# Patient Record
Sex: Male | Born: 1940 | Race: White | Hispanic: No | Marital: Married | Smoking: Former smoker
Health system: Southern US, Community
[De-identification: ages and names within clinical notes are randomized; demographics above are authoritative.]

## PROBLEM LIST (undated history)

## (undated) DIAGNOSIS — I2510 Atherosclerotic heart disease of native coronary artery without angina pectoris: Secondary | ICD-10-CM

## (undated) DIAGNOSIS — I10 Essential (primary) hypertension: Secondary | ICD-10-CM

## (undated) DIAGNOSIS — K219 Gastro-esophageal reflux disease without esophagitis: Secondary | ICD-10-CM

## (undated) DIAGNOSIS — E7800 Pure hypercholesterolemia, unspecified: Secondary | ICD-10-CM

## (undated) DIAGNOSIS — Z87891 Personal history of nicotine dependence: Secondary | ICD-10-CM

## (undated) DIAGNOSIS — IMO0001 Reserved for inherently not codable concepts without codable children: Secondary | ICD-10-CM

## (undated) DIAGNOSIS — T82855A Stenosis of coronary artery stent, initial encounter: Secondary | ICD-10-CM

## (undated) DIAGNOSIS — I219 Acute myocardial infarction, unspecified: Secondary | ICD-10-CM

## (undated) DIAGNOSIS — J449 Chronic obstructive pulmonary disease, unspecified: Secondary | ICD-10-CM

## (undated) DIAGNOSIS — H9190 Unspecified hearing loss, unspecified ear: Secondary | ICD-10-CM

## (undated) DIAGNOSIS — Z8719 Personal history of other diseases of the digestive system: Secondary | ICD-10-CM

## (undated) DIAGNOSIS — K5790 Diverticulosis of intestine, part unspecified, without perforation or abscess without bleeding: Secondary | ICD-10-CM

## (undated) DIAGNOSIS — N320 Bladder-neck obstruction: Secondary | ICD-10-CM

## (undated) HISTORY — DX: Personal history of nicotine dependence: Z87.891

## (undated) HISTORY — DX: Atherosclerotic heart disease of native coronary artery without angina pectoris: I25.10

## (undated) HISTORY — DX: Bladder-neck obstruction: N32.0

## (undated) HISTORY — PX: ANGIOPLASTY / STENTING FEMORAL: SUR30

## (undated) HISTORY — DX: Acute myocardial infarction, unspecified: I21.9

## (undated) HISTORY — DX: Stenosis of coronary artery stent, initial encounter: T82.855A

## (undated) HISTORY — DX: Gastro-esophageal reflux disease without esophagitis: K21.9

## (undated) HISTORY — DX: Chronic obstructive pulmonary disease, unspecified: J44.9

## (undated) HISTORY — DX: Pure hypercholesterolemia, unspecified: E78.00

## (undated) HISTORY — DX: Diverticulosis of intestine, part unspecified, without perforation or abscess without bleeding: K57.90

---

## 2005-11-22 DIAGNOSIS — I2510 Atherosclerotic heart disease of native coronary artery without angina pectoris: Secondary | ICD-10-CM

## 2005-11-22 HISTORY — DX: Atherosclerotic heart disease of native coronary artery without angina pectoris: I25.10

## 2005-11-22 HISTORY — PX: CORONARY STENT PLACEMENT: SHX1402

## 2006-03-03 ENCOUNTER — Inpatient Hospital Stay: Payer: Self-pay | Admitting: Internal Medicine

## 2006-03-03 ENCOUNTER — Other Ambulatory Visit: Payer: Self-pay

## 2006-03-04 ENCOUNTER — Other Ambulatory Visit: Payer: Self-pay

## 2007-02-28 ENCOUNTER — Ambulatory Visit: Payer: Self-pay | Admitting: Gastroenterology

## 2007-03-24 ENCOUNTER — Ambulatory Visit: Payer: Self-pay | Admitting: Gastroenterology

## 2008-12-19 ENCOUNTER — Ambulatory Visit: Payer: Self-pay | Admitting: Gastroenterology

## 2009-04-10 ENCOUNTER — Emergency Department: Payer: Self-pay | Admitting: Unknown Physician Specialty

## 2011-01-19 ENCOUNTER — Ambulatory Visit: Payer: Self-pay | Admitting: Gastroenterology

## 2011-01-21 LAB — PATHOLOGY REPORT

## 2011-11-23 DIAGNOSIS — T82855A Stenosis of coronary artery stent, initial encounter: Secondary | ICD-10-CM

## 2011-11-23 HISTORY — DX: Stenosis of coronary artery stent, initial encounter: T82.855A

## 2011-12-02 DIAGNOSIS — H2510 Age-related nuclear cataract, unspecified eye: Secondary | ICD-10-CM | POA: Diagnosis not present

## 2011-12-14 DIAGNOSIS — E7800 Pure hypercholesterolemia, unspecified: Secondary | ICD-10-CM | POA: Diagnosis not present

## 2011-12-14 DIAGNOSIS — I2510 Atherosclerotic heart disease of native coronary artery without angina pectoris: Secondary | ICD-10-CM | POA: Diagnosis not present

## 2011-12-14 DIAGNOSIS — I471 Supraventricular tachycardia: Secondary | ICD-10-CM | POA: Diagnosis not present

## 2011-12-14 DIAGNOSIS — I10 Essential (primary) hypertension: Secondary | ICD-10-CM | POA: Diagnosis not present

## 2011-12-14 DIAGNOSIS — K219 Gastro-esophageal reflux disease without esophagitis: Secondary | ICD-10-CM | POA: Diagnosis not present

## 2011-12-14 DIAGNOSIS — R000 Tachycardia, unspecified: Secondary | ICD-10-CM | POA: Diagnosis not present

## 2011-12-14 DIAGNOSIS — Z79899 Other long term (current) drug therapy: Secondary | ICD-10-CM | POA: Diagnosis not present

## 2012-04-25 DIAGNOSIS — I2510 Atherosclerotic heart disease of native coronary artery without angina pectoris: Secondary | ICD-10-CM | POA: Diagnosis not present

## 2012-04-25 DIAGNOSIS — E782 Mixed hyperlipidemia: Secondary | ICD-10-CM | POA: Diagnosis not present

## 2012-04-25 DIAGNOSIS — I471 Supraventricular tachycardia: Secondary | ICD-10-CM | POA: Diagnosis not present

## 2012-04-25 DIAGNOSIS — R0989 Other specified symptoms and signs involving the circulatory and respiratory systems: Secondary | ICD-10-CM | POA: Diagnosis not present

## 2012-04-25 DIAGNOSIS — R0789 Other chest pain: Secondary | ICD-10-CM | POA: Diagnosis not present

## 2012-04-28 DIAGNOSIS — R0789 Other chest pain: Secondary | ICD-10-CM | POA: Diagnosis not present

## 2012-04-28 DIAGNOSIS — I471 Supraventricular tachycardia, unspecified: Secondary | ICD-10-CM | POA: Diagnosis not present

## 2012-05-04 DIAGNOSIS — I2510 Atherosclerotic heart disease of native coronary artery without angina pectoris: Secondary | ICD-10-CM | POA: Diagnosis not present

## 2012-05-04 DIAGNOSIS — R9430 Abnormal result of cardiovascular function study, unspecified: Secondary | ICD-10-CM | POA: Diagnosis not present

## 2012-05-04 DIAGNOSIS — E782 Mixed hyperlipidemia: Secondary | ICD-10-CM | POA: Diagnosis not present

## 2012-05-04 DIAGNOSIS — I471 Supraventricular tachycardia, unspecified: Secondary | ICD-10-CM | POA: Diagnosis not present

## 2012-05-10 ENCOUNTER — Ambulatory Visit: Payer: Self-pay | Admitting: Cardiology

## 2012-05-10 DIAGNOSIS — Z87891 Personal history of nicotine dependence: Secondary | ICD-10-CM | POA: Diagnosis not present

## 2012-05-10 DIAGNOSIS — R9431 Abnormal electrocardiogram [ECG] [EKG]: Secondary | ICD-10-CM | POA: Diagnosis not present

## 2012-05-10 DIAGNOSIS — Z9861 Coronary angioplasty status: Secondary | ICD-10-CM | POA: Diagnosis not present

## 2012-05-10 DIAGNOSIS — Z800 Family history of malignant neoplasm of digestive organs: Secondary | ICD-10-CM | POA: Diagnosis not present

## 2012-05-10 DIAGNOSIS — R0789 Other chest pain: Secondary | ICD-10-CM | POA: Diagnosis not present

## 2012-05-10 DIAGNOSIS — I471 Supraventricular tachycardia: Secondary | ICD-10-CM | POA: Diagnosis not present

## 2012-05-10 DIAGNOSIS — Z79899 Other long term (current) drug therapy: Secondary | ICD-10-CM | POA: Diagnosis not present

## 2012-05-10 DIAGNOSIS — Z823 Family history of stroke: Secondary | ICD-10-CM | POA: Diagnosis not present

## 2012-05-10 DIAGNOSIS — R079 Chest pain, unspecified: Secondary | ICD-10-CM | POA: Diagnosis not present

## 2012-05-10 DIAGNOSIS — I2510 Atherosclerotic heart disease of native coronary artery without angina pectoris: Secondary | ICD-10-CM | POA: Diagnosis not present

## 2012-05-10 DIAGNOSIS — R9430 Abnormal result of cardiovascular function study, unspecified: Secondary | ICD-10-CM | POA: Diagnosis not present

## 2012-05-10 DIAGNOSIS — Z6826 Body mass index (BMI) 26.0-26.9, adult: Secondary | ICD-10-CM | POA: Diagnosis not present

## 2012-05-10 DIAGNOSIS — I10 Essential (primary) hypertension: Secondary | ICD-10-CM | POA: Diagnosis not present

## 2012-05-10 DIAGNOSIS — E785 Hyperlipidemia, unspecified: Secondary | ICD-10-CM | POA: Diagnosis not present

## 2012-05-10 HISTORY — PX: CARDIAC CATHETERIZATION: SHX172

## 2012-05-16 DIAGNOSIS — L570 Actinic keratosis: Secondary | ICD-10-CM | POA: Diagnosis not present

## 2012-05-16 DIAGNOSIS — D239 Other benign neoplasm of skin, unspecified: Secondary | ICD-10-CM | POA: Diagnosis not present

## 2012-05-17 DIAGNOSIS — I2510 Atherosclerotic heart disease of native coronary artery without angina pectoris: Secondary | ICD-10-CM | POA: Diagnosis not present

## 2012-05-17 DIAGNOSIS — E782 Mixed hyperlipidemia: Secondary | ICD-10-CM | POA: Diagnosis not present

## 2012-05-17 DIAGNOSIS — I471 Supraventricular tachycardia: Secondary | ICD-10-CM | POA: Diagnosis not present

## 2012-06-26 DIAGNOSIS — N529 Male erectile dysfunction, unspecified: Secondary | ICD-10-CM | POA: Diagnosis not present

## 2012-06-26 DIAGNOSIS — E7800 Pure hypercholesterolemia, unspecified: Secondary | ICD-10-CM | POA: Diagnosis not present

## 2012-06-26 DIAGNOSIS — Z79899 Other long term (current) drug therapy: Secondary | ICD-10-CM | POA: Diagnosis not present

## 2012-06-26 DIAGNOSIS — I10 Essential (primary) hypertension: Secondary | ICD-10-CM | POA: Diagnosis not present

## 2012-08-09 DIAGNOSIS — E782 Mixed hyperlipidemia: Secondary | ICD-10-CM | POA: Diagnosis not present

## 2012-08-09 DIAGNOSIS — I471 Supraventricular tachycardia: Secondary | ICD-10-CM | POA: Diagnosis not present

## 2012-08-09 DIAGNOSIS — I2510 Atherosclerotic heart disease of native coronary artery without angina pectoris: Secondary | ICD-10-CM | POA: Diagnosis not present

## 2012-08-17 DIAGNOSIS — Z23 Encounter for immunization: Secondary | ICD-10-CM | POA: Diagnosis not present

## 2012-11-23 DIAGNOSIS — I2510 Atherosclerotic heart disease of native coronary artery without angina pectoris: Secondary | ICD-10-CM | POA: Diagnosis not present

## 2012-11-23 DIAGNOSIS — I471 Supraventricular tachycardia: Secondary | ICD-10-CM | POA: Diagnosis not present

## 2012-11-23 DIAGNOSIS — R0989 Other specified symptoms and signs involving the circulatory and respiratory systems: Secondary | ICD-10-CM | POA: Diagnosis not present

## 2012-11-23 DIAGNOSIS — R0609 Other forms of dyspnea: Secondary | ICD-10-CM | POA: Diagnosis not present

## 2012-12-13 DIAGNOSIS — H2510 Age-related nuclear cataract, unspecified eye: Secondary | ICD-10-CM | POA: Diagnosis not present

## 2012-12-27 DIAGNOSIS — K219 Gastro-esophageal reflux disease without esophagitis: Secondary | ICD-10-CM | POA: Diagnosis not present

## 2012-12-27 DIAGNOSIS — E7800 Pure hypercholesterolemia, unspecified: Secondary | ICD-10-CM | POA: Diagnosis not present

## 2012-12-27 DIAGNOSIS — J019 Acute sinusitis, unspecified: Secondary | ICD-10-CM | POA: Diagnosis not present

## 2012-12-27 DIAGNOSIS — Z79899 Other long term (current) drug therapy: Secondary | ICD-10-CM | POA: Diagnosis not present

## 2012-12-27 DIAGNOSIS — N529 Male erectile dysfunction, unspecified: Secondary | ICD-10-CM | POA: Diagnosis not present

## 2013-01-22 DIAGNOSIS — J019 Acute sinusitis, unspecified: Secondary | ICD-10-CM | POA: Diagnosis not present

## 2013-03-20 DIAGNOSIS — J019 Acute sinusitis, unspecified: Secondary | ICD-10-CM | POA: Diagnosis not present

## 2013-05-15 DIAGNOSIS — L570 Actinic keratosis: Secondary | ICD-10-CM | POA: Diagnosis not present

## 2013-05-15 DIAGNOSIS — Z1283 Encounter for screening for malignant neoplasm of skin: Secondary | ICD-10-CM | POA: Diagnosis not present

## 2013-06-21 DIAGNOSIS — I2510 Atherosclerotic heart disease of native coronary artery without angina pectoris: Secondary | ICD-10-CM | POA: Diagnosis not present

## 2013-06-21 DIAGNOSIS — R0789 Other chest pain: Secondary | ICD-10-CM | POA: Diagnosis not present

## 2013-06-21 DIAGNOSIS — I471 Supraventricular tachycardia: Secondary | ICD-10-CM | POA: Diagnosis not present

## 2013-06-21 DIAGNOSIS — R0609 Other forms of dyspnea: Secondary | ICD-10-CM | POA: Diagnosis not present

## 2013-06-21 DIAGNOSIS — R0989 Other specified symptoms and signs involving the circulatory and respiratory systems: Secondary | ICD-10-CM | POA: Diagnosis not present

## 2013-07-09 DIAGNOSIS — E7800 Pure hypercholesterolemia, unspecified: Secondary | ICD-10-CM | POA: Diagnosis not present

## 2013-07-09 DIAGNOSIS — Z79899 Other long term (current) drug therapy: Secondary | ICD-10-CM | POA: Diagnosis not present

## 2013-07-09 DIAGNOSIS — R000 Tachycardia, unspecified: Secondary | ICD-10-CM | POA: Diagnosis not present

## 2013-07-09 DIAGNOSIS — K219 Gastro-esophageal reflux disease without esophagitis: Secondary | ICD-10-CM | POA: Diagnosis not present

## 2013-09-01 DIAGNOSIS — R079 Chest pain, unspecified: Secondary | ICD-10-CM | POA: Diagnosis not present

## 2013-09-27 DIAGNOSIS — Z23 Encounter for immunization: Secondary | ICD-10-CM | POA: Diagnosis not present

## 2013-12-18 DIAGNOSIS — H2510 Age-related nuclear cataract, unspecified eye: Secondary | ICD-10-CM | POA: Diagnosis not present

## 2013-12-26 DIAGNOSIS — I471 Supraventricular tachycardia: Secondary | ICD-10-CM | POA: Diagnosis not present

## 2013-12-26 DIAGNOSIS — I2510 Atherosclerotic heart disease of native coronary artery without angina pectoris: Secondary | ICD-10-CM | POA: Diagnosis not present

## 2013-12-31 DIAGNOSIS — M722 Plantar fascial fibromatosis: Secondary | ICD-10-CM | POA: Diagnosis not present

## 2014-01-09 DIAGNOSIS — Z79899 Other long term (current) drug therapy: Secondary | ICD-10-CM | POA: Diagnosis not present

## 2014-01-09 DIAGNOSIS — Z23 Encounter for immunization: Secondary | ICD-10-CM | POA: Diagnosis not present

## 2014-01-09 DIAGNOSIS — E7800 Pure hypercholesterolemia, unspecified: Secondary | ICD-10-CM | POA: Diagnosis not present

## 2014-01-09 DIAGNOSIS — I10 Essential (primary) hypertension: Secondary | ICD-10-CM | POA: Diagnosis not present

## 2014-01-21 DIAGNOSIS — M722 Plantar fascial fibromatosis: Secondary | ICD-10-CM | POA: Diagnosis not present

## 2014-01-23 DIAGNOSIS — K2270 Barrett's esophagus without dysplasia: Secondary | ICD-10-CM | POA: Diagnosis not present

## 2014-01-23 DIAGNOSIS — Z800 Family history of malignant neoplasm of digestive organs: Secondary | ICD-10-CM | POA: Diagnosis not present

## 2014-02-05 ENCOUNTER — Ambulatory Visit: Payer: Self-pay | Admitting: Gastroenterology

## 2014-02-05 DIAGNOSIS — N320 Bladder-neck obstruction: Secondary | ICD-10-CM | POA: Diagnosis not present

## 2014-02-05 DIAGNOSIS — K2100 Gastro-esophageal reflux disease with esophagitis, without bleeding: Secondary | ICD-10-CM | POA: Diagnosis not present

## 2014-02-05 DIAGNOSIS — E7800 Pure hypercholesterolemia, unspecified: Secondary | ICD-10-CM | POA: Diagnosis not present

## 2014-02-05 DIAGNOSIS — K2270 Barrett's esophagus without dysplasia: Secondary | ICD-10-CM | POA: Diagnosis not present

## 2014-02-05 DIAGNOSIS — Z800 Family history of malignant neoplasm of digestive organs: Secondary | ICD-10-CM | POA: Diagnosis not present

## 2014-02-05 DIAGNOSIS — Z79899 Other long term (current) drug therapy: Secondary | ICD-10-CM | POA: Diagnosis not present

## 2014-02-05 DIAGNOSIS — I498 Other specified cardiac arrhythmias: Secondary | ICD-10-CM | POA: Diagnosis not present

## 2014-02-05 DIAGNOSIS — K219 Gastro-esophageal reflux disease without esophagitis: Secondary | ICD-10-CM | POA: Diagnosis not present

## 2014-02-05 DIAGNOSIS — Z9861 Coronary angioplasty status: Secondary | ICD-10-CM | POA: Diagnosis not present

## 2014-02-05 DIAGNOSIS — K224 Dyskinesia of esophagus: Secondary | ICD-10-CM | POA: Diagnosis not present

## 2014-02-05 DIAGNOSIS — Z7982 Long term (current) use of aspirin: Secondary | ICD-10-CM | POA: Diagnosis not present

## 2014-02-05 DIAGNOSIS — K2970 Gastritis, unspecified, without bleeding: Secondary | ICD-10-CM | POA: Diagnosis not present

## 2014-02-05 DIAGNOSIS — K2990 Gastroduodenitis, unspecified, without bleeding: Secondary | ICD-10-CM | POA: Diagnosis not present

## 2014-02-05 DIAGNOSIS — Z86010 Personal history of colonic polyps: Secondary | ICD-10-CM | POA: Diagnosis not present

## 2014-02-05 DIAGNOSIS — J438 Other emphysema: Secondary | ICD-10-CM | POA: Diagnosis not present

## 2014-02-05 DIAGNOSIS — K5730 Diverticulosis of large intestine without perforation or abscess without bleeding: Secondary | ICD-10-CM | POA: Diagnosis not present

## 2014-02-05 DIAGNOSIS — I2510 Atherosclerotic heart disease of native coronary artery without angina pectoris: Secondary | ICD-10-CM | POA: Diagnosis not present

## 2014-02-05 DIAGNOSIS — I252 Old myocardial infarction: Secondary | ICD-10-CM | POA: Diagnosis not present

## 2014-02-05 DIAGNOSIS — K648 Other hemorrhoids: Secondary | ICD-10-CM | POA: Diagnosis not present

## 2014-02-05 DIAGNOSIS — Z8489 Family history of other specified conditions: Secondary | ICD-10-CM | POA: Diagnosis not present

## 2014-02-05 DIAGNOSIS — I10 Essential (primary) hypertension: Secondary | ICD-10-CM | POA: Diagnosis not present

## 2014-02-05 HISTORY — PX: COLONOSCOPY: SHX174

## 2014-02-05 HISTORY — PX: ESOPHAGOGASTRODUODENOSCOPY: SHX1529

## 2014-02-08 LAB — PATHOLOGY REPORT

## 2014-02-19 LAB — HM COLONOSCOPY

## 2014-04-26 ENCOUNTER — Encounter: Payer: Self-pay | Admitting: Pulmonary Disease

## 2014-04-26 ENCOUNTER — Ambulatory Visit (INDEPENDENT_AMBULATORY_CARE_PROVIDER_SITE_OTHER)
Admission: RE | Admit: 2014-04-26 | Discharge: 2014-04-26 | Disposition: A | Discharge status: Home / Self Care | Payer: Medicare Other | Source: Ambulatory Visit | Attending: Pulmonary Disease | Admitting: Pulmonary Disease

## 2014-04-26 ENCOUNTER — Ambulatory Visit (INDEPENDENT_AMBULATORY_CARE_PROVIDER_SITE_OTHER): Payer: Medicare Other | Admitting: Pulmonary Disease

## 2014-04-26 VITALS — BP 144/86 | HR 70 | Ht 67.0 in | Wt 169.0 lb

## 2014-04-26 DIAGNOSIS — J441 Chronic obstructive pulmonary disease with (acute) exacerbation: Secondary | ICD-10-CM

## 2014-04-26 DIAGNOSIS — R0602 Shortness of breath: Secondary | ICD-10-CM | POA: Diagnosis not present

## 2014-04-26 DIAGNOSIS — J4489 Other specified chronic obstructive pulmonary disease: Secondary | ICD-10-CM | POA: Diagnosis not present

## 2014-04-26 DIAGNOSIS — I2510 Atherosclerotic heart disease of native coronary artery without angina pectoris: Secondary | ICD-10-CM | POA: Insufficient documentation

## 2014-04-26 DIAGNOSIS — I25810 Atherosclerosis of coronary artery bypass graft(s) without angina pectoris: Secondary | ICD-10-CM | POA: Diagnosis not present

## 2014-04-26 DIAGNOSIS — J449 Chronic obstructive pulmonary disease, unspecified: Secondary | ICD-10-CM | POA: Diagnosis not present

## 2014-04-26 NOTE — Progress Notes (Signed)
Quick Note:  lmtcb X1 ______ 

## 2014-04-26 NOTE — Assessment & Plan Note (Signed)
I have no objective data to work on here. Unfortunately he arrived 30 minutes late today so we were unable to perform simple spirometry today. His ambulatory oximetry was normal. I do believe he has COPD based on his extensive smoking history and the fact that he said increasing shortness of breath recently.  Plan: -Full pulmonary function test -Chest x-ray -Start Spiriva -Followup 4-6 weeks

## 2014-04-26 NOTE — Assessment & Plan Note (Signed)
It bothers me that he was told after his most recent left heart catheterization that he needed to have coronary artery bypass grafting but he refused. Given this, cannot rule out coronary artery disease contributing to his shortness of breath. I've asked him to see his cardiologist in the next few weeks to discuss increasing shortness of breath. We will try to make efforts to obtain records from his cardiologist's office.

## 2014-04-26 NOTE — Patient Instructions (Signed)
Take the spiriva two puffs once a day no matter how you feel We will set up a pulmonary function test in [facility] See your cardiologist We will have you get a chest x-ray for shortness of breath today on your way out and will call you with the results We will see you back in 4 weeks in [place] or sooner if needed

## 2014-04-26 NOTE — Progress Notes (Signed)
Subjective:    Patient ID: John Burns, male    DOB: 1941/10/20, 73 y.o.   MRN: [id number]  HPI  John Burns is here today to see me for COPD and shortness of breath. He says that he has known for about 10 years that he has emphysema. He says that he smoked a pack a day for 40 years and quit in the 1990s. He has not had much trouble with shortness of breath over the years but he has had heart trouble. His most recent left heart catheterization showed evidence of coronary artery disease which apparently would only be amenable to bypass surgery. The patient elected to not have surgery. That was approximately in 2012 and he has been managed medically since then.  However, he says that for the last 6 months she's been having increasing shortness of breath with exertion. 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 Specifically, he says that he can walk on level ground without too much trouble but yesterday when he was throwing chunks of wood he started to notice increasing shortness of breath. He also says that whenever he bends over he is been experiencing more shortness of breath and chest tightness. He has not had chest pain or leg swelling with his shortness of breath. He says typically it takes him about 5-10 minutes of rest before shortness of breath will go away. Cough has not been a significant problem for him. He does not have postnasal drip or significant acid reflux. He has not been back to the cardiologist in about 7 or 8 months.  Past Medical History  Diagnosis Date  . Hypercholesteremia   . Heart attack   . COPD (chronic obstructive pulmonary disease)      Family History  Problem Relation Age of Onset  . Heart disease Mother   . Cancer Father     prostate     History   Social History  . Marital Status: Married    Spouse Name: N/A    Number of Children: N/A  . Years of Education: N/A   Occupational History  . Not on file.    Social History Main Topics  . Smoking status: Former Smoker -- 1.00 packs/day for 40 years    Types: Cigarettes    Quit date: 11/22/1993  . Smokeless tobacco: Former Systems developer    Types: Chew     Comment: used chew to help quit smoking X1 year.  . Alcohol Use: No  . Drug Use: No  . Sexual Activity: Not on file   Other Topics Concern  . Not on file   Social History Narrative  . No narrative on file     No Known Allergies   No outpatient prescriptions prior to visit.   No facility-administered medications prior to visit.     Review of Systems     Objective:   Physical Exam  Filed Vitals:   04/26/14 1009  BP: 144/86  Pulse: 70  Height: 5\' 7"  (1.702 m)  Weight: 169 lb (76.658 kg)  SpO2: 97%   RA  Gen: well appearing, no acute distress HEENT: NCAT, PERRL, EOMi, OP clear, neck supple without masses PULM: CTA B CV: RRR, no mgr, no JVD AB: BS+, soft, nontender, no hsm Ext: warm, no edema, no clubbing, no cyanosis Derm: no rash or skin breakdown Neuro: A&Ox4, CN II-XII intact, strength 5/5 in all 4 extremities       Assessment & Plan:  COPD (chronic obstructive pulmonary disease) I have no objective data to work on here. Unfortunately he arrived 30 minutes late today so we were unable to perform simple spirometry today. His ambulatory oximetry was normal. I do believe he has COPD based on his extensive smoking history and the fact that he said increasing shortness of breath recently.  Plan: -Full pulmonary function test -Chest x-ray -Start Spiriva -Followup 4-6 weeks  CAD (coronary artery disease) of artery bypass graft It bothers me that he was told after his most recent left heart catheterization that he needed to have coronary artery bypass grafting but he refused. Given this, cannot rule out coronary artery disease contributing to his shortness of breath. I've asked him to see his cardiologist in the next few weeks to discuss increasing shortness of breath.  We will try to make efforts to obtain records from his cardiologist's office.   Updated Medication List Outpatient Encounter Prescriptions as of 04/26/2014  Medication Sig  . aspirin 81 MG tablet Take 81 mg by mouth daily.  Marland Kitchen atorvastatin (LIPITOR) 10 MG tablet Take 10 mg by mouth daily.  . clopidogrel (PLAVIX) 75 MG tablet Take 75 mg by mouth daily with breakfast.  . Fluticasone-Salmeterol (ADVAIR) 250-50 MCG/DOSE AEPB Inhale 1 puff into the lungs 2 (two) times daily.  . metoprolol (LOPRESSOR) 100 MG tablet Take 100 mg by mouth daily.  Marland Kitchen omeprazole (PRILOSEC) 20 MG capsule Take 20 mg by mouth daily.  . vitamin E 400 UNIT capsule Take 400 Units by mouth daily.

## 2014-04-29 ENCOUNTER — Telehealth: Payer: Self-pay | Admitting: Pulmonary Disease

## 2014-04-29 NOTE — Telephone Encounter (Signed)
Please let him know that this was normal --  LMTCB x1 w/ spouse

## 2014-04-30 NOTE — Telephone Encounter (Signed)
I spoke with patient about results and he verbalized understanding and had no questions 

## 2014-04-30 NOTE — Telephone Encounter (Signed)
LMTCB

## 2014-05-07 DIAGNOSIS — Z9889 Other specified postprocedural states: Secondary | ICD-10-CM | POA: Diagnosis not present

## 2014-05-07 DIAGNOSIS — R0602 Shortness of breath: Secondary | ICD-10-CM | POA: Diagnosis not present

## 2014-05-07 DIAGNOSIS — E785 Hyperlipidemia, unspecified: Secondary | ICD-10-CM | POA: Diagnosis not present

## 2014-05-07 DIAGNOSIS — G809 Cerebral palsy, unspecified: Secondary | ICD-10-CM | POA: Diagnosis not present

## 2014-05-07 DIAGNOSIS — I471 Supraventricular tachycardia: Secondary | ICD-10-CM | POA: Insufficient documentation

## 2014-05-07 DIAGNOSIS — I498 Other specified cardiac arrhythmias: Secondary | ICD-10-CM | POA: Diagnosis not present

## 2014-05-09 ENCOUNTER — Ambulatory Visit: Payer: Self-pay | Admitting: Pulmonary Disease

## 2014-05-09 DIAGNOSIS — J441 Chronic obstructive pulmonary disease with (acute) exacerbation: Secondary | ICD-10-CM | POA: Diagnosis not present

## 2014-05-09 DIAGNOSIS — R0602 Shortness of breath: Secondary | ICD-10-CM | POA: Diagnosis not present

## 2014-05-09 LAB — PULMONARY FUNCTION TEST

## 2014-05-15 DIAGNOSIS — W57XXXA Bitten or stung by nonvenomous insect and other nonvenomous arthropods, initial encounter: Secondary | ICD-10-CM | POA: Diagnosis not present

## 2014-05-15 DIAGNOSIS — Z1283 Encounter for screening for malignant neoplasm of skin: Secondary | ICD-10-CM | POA: Diagnosis not present

## 2014-05-15 DIAGNOSIS — L570 Actinic keratosis: Secondary | ICD-10-CM | POA: Diagnosis not present

## 2014-05-15 DIAGNOSIS — T148 Other injury of unspecified body region: Secondary | ICD-10-CM | POA: Diagnosis not present

## 2014-05-17 DIAGNOSIS — G809 Cerebral palsy, unspecified: Secondary | ICD-10-CM | POA: Diagnosis not present

## 2014-05-17 DIAGNOSIS — R0602 Shortness of breath: Secondary | ICD-10-CM | POA: Diagnosis not present

## 2014-05-18 ENCOUNTER — Emergency Department: Payer: Self-pay | Admitting: Emergency Medicine

## 2014-05-18 DIAGNOSIS — I4891 Unspecified atrial fibrillation: Secondary | ICD-10-CM | POA: Diagnosis not present

## 2014-05-18 DIAGNOSIS — S99919A Unspecified injury of unspecified ankle, initial encounter: Secondary | ICD-10-CM | POA: Diagnosis not present

## 2014-05-18 DIAGNOSIS — S93409A Sprain of unspecified ligament of unspecified ankle, initial encounter: Secondary | ICD-10-CM | POA: Diagnosis not present

## 2014-05-18 DIAGNOSIS — S8990XA Unspecified injury of unspecified lower leg, initial encounter: Secondary | ICD-10-CM | POA: Diagnosis not present

## 2014-05-18 DIAGNOSIS — I2510 Atherosclerotic heart disease of native coronary artery without angina pectoris: Secondary | ICD-10-CM | POA: Diagnosis not present

## 2014-05-18 DIAGNOSIS — Z79899 Other long term (current) drug therapy: Secondary | ICD-10-CM | POA: Diagnosis not present

## 2014-05-18 DIAGNOSIS — M25579 Pain in unspecified ankle and joints of unspecified foot: Secondary | ICD-10-CM | POA: Diagnosis not present

## 2014-05-22 DIAGNOSIS — E785 Hyperlipidemia, unspecified: Secondary | ICD-10-CM | POA: Diagnosis not present

## 2014-05-22 DIAGNOSIS — R079 Chest pain, unspecified: Secondary | ICD-10-CM | POA: Diagnosis not present

## 2014-05-22 DIAGNOSIS — I498 Other specified cardiac arrhythmias: Secondary | ICD-10-CM | POA: Diagnosis not present

## 2014-05-22 DIAGNOSIS — Z9889 Other specified postprocedural states: Secondary | ICD-10-CM | POA: Diagnosis not present

## 2014-05-23 ENCOUNTER — Telehealth: Payer: Self-pay

## 2014-05-23 ENCOUNTER — Encounter: Payer: Self-pay | Admitting: Pulmonary Disease

## 2014-05-23 DIAGNOSIS — M25579 Pain in unspecified ankle and joints of unspecified foot: Secondary | ICD-10-CM | POA: Diagnosis not present

## 2014-05-23 NOTE — Telephone Encounter (Signed)
lmtcb X1 to relay results. 

## 2014-05-23 NOTE — Telephone Encounter (Signed)
Message copied by Len Blalock on Thu May 23, 2014  2:13 PM ------      Message from: Simonne Maffucci B      Created: Thu May 23, 2014  1:38 PM       A,            Please let him know that his PFTs were normal            Thanks      B ------

## 2014-05-27 NOTE — Telephone Encounter (Signed)
[phone] returning a call

## 2014-05-27 NOTE — Telephone Encounter (Signed)
Called, spoke with pt's wife.  Informed her of below results per BQ.  She verbalized understanding. During last OV with BQ, pt was started on Spiriva.  Wife reports pt stopped this because the chest pain he was having prior to starting it worsened.  Wife doesn't believe pt has noticed a difference in the CP since pt has stopped the Spiriva.  She did state pt is scheduled to have heart cath in the morning at [facility].  Dr. Lake Bells, pt has a pending OV with you on July 14.  Wife would like to know if it is necessary to keep this appt given PFT and CXR results and above with Spiriva.  Please advise.  Thank you.

## 2014-05-28 ENCOUNTER — Ambulatory Visit: Payer: Self-pay | Admitting: Cardiology

## 2014-05-28 DIAGNOSIS — E785 Hyperlipidemia, unspecified: Secondary | ICD-10-CM | POA: Diagnosis not present

## 2014-05-28 DIAGNOSIS — Z79899 Other long term (current) drug therapy: Secondary | ICD-10-CM | POA: Diagnosis not present

## 2014-05-28 DIAGNOSIS — R9430 Abnormal result of cardiovascular function study, unspecified: Secondary | ICD-10-CM | POA: Diagnosis not present

## 2014-05-28 DIAGNOSIS — Z87891 Personal history of nicotine dependence: Secondary | ICD-10-CM | POA: Diagnosis not present

## 2014-05-28 DIAGNOSIS — I498 Other specified cardiac arrhythmias: Secondary | ICD-10-CM | POA: Diagnosis not present

## 2014-05-28 DIAGNOSIS — R0789 Other chest pain: Secondary | ICD-10-CM | POA: Diagnosis not present

## 2014-05-28 DIAGNOSIS — R079 Chest pain, unspecified: Secondary | ICD-10-CM | POA: Diagnosis not present

## 2014-05-28 DIAGNOSIS — I10 Essential (primary) hypertension: Secondary | ICD-10-CM | POA: Diagnosis not present

## 2014-05-28 DIAGNOSIS — R0602 Shortness of breath: Secondary | ICD-10-CM | POA: Diagnosis not present

## 2014-05-28 DIAGNOSIS — I2510 Atherosclerotic heart disease of native coronary artery without angina pectoris: Secondary | ICD-10-CM | POA: Diagnosis not present

## 2014-05-28 NOTE — Telephone Encounter (Signed)
I am happy to see them if he is still short of breath, but otherwise no need to come back

## 2014-05-28 NOTE — Telephone Encounter (Signed)
I called made pt aware of BQ response. Nothing further needed

## 2014-05-30 DIAGNOSIS — Z9889 Other specified postprocedural states: Secondary | ICD-10-CM | POA: Diagnosis not present

## 2014-05-30 DIAGNOSIS — R079 Chest pain, unspecified: Secondary | ICD-10-CM | POA: Diagnosis not present

## 2014-05-30 DIAGNOSIS — E785 Hyperlipidemia, unspecified: Secondary | ICD-10-CM | POA: Diagnosis not present

## 2014-06-03 DIAGNOSIS — Z01818 Encounter for other preprocedural examination: Secondary | ICD-10-CM | POA: Diagnosis not present

## 2014-06-03 DIAGNOSIS — I209 Angina pectoris, unspecified: Secondary | ICD-10-CM | POA: Diagnosis not present

## 2014-06-03 DIAGNOSIS — I2510 Atherosclerotic heart disease of native coronary artery without angina pectoris: Secondary | ICD-10-CM | POA: Diagnosis not present

## 2014-06-03 DIAGNOSIS — I498 Other specified cardiac arrhythmias: Secondary | ICD-10-CM | POA: Diagnosis not present

## 2014-06-04 ENCOUNTER — Ambulatory Visit: Payer: Medicare Other | Admitting: Pulmonary Disease

## 2014-06-04 DIAGNOSIS — Z951 Presence of aortocoronary bypass graft: Secondary | ICD-10-CM | POA: Diagnosis not present

## 2014-06-04 DIAGNOSIS — R079 Chest pain, unspecified: Secondary | ICD-10-CM | POA: Diagnosis not present

## 2014-06-04 DIAGNOSIS — Z006 Encounter for examination for normal comparison and control in clinical research program: Secondary | ICD-10-CM | POA: Diagnosis not present

## 2014-06-04 DIAGNOSIS — I200 Unstable angina: Secondary | ICD-10-CM | POA: Diagnosis not present

## 2014-06-04 DIAGNOSIS — I2510 Atherosclerotic heart disease of native coronary artery without angina pectoris: Secondary | ICD-10-CM | POA: Diagnosis not present

## 2014-06-04 DIAGNOSIS — J9383 Other pneumothorax: Secondary | ICD-10-CM | POA: Diagnosis not present

## 2014-06-04 DIAGNOSIS — J90 Pleural effusion, not elsewhere classified: Secondary | ICD-10-CM | POA: Diagnosis not present

## 2014-06-04 DIAGNOSIS — K219 Gastro-esophageal reflux disease without esophagitis: Secondary | ICD-10-CM | POA: Diagnosis present

## 2014-06-04 DIAGNOSIS — I471 Supraventricular tachycardia: Secondary | ICD-10-CM | POA: Diagnosis not present

## 2014-06-04 DIAGNOSIS — I498 Other specified cardiac arrhythmias: Secondary | ICD-10-CM | POA: Diagnosis not present

## 2014-06-04 DIAGNOSIS — J9819 Other pulmonary collapse: Secondary | ICD-10-CM | POA: Diagnosis not present

## 2014-06-04 DIAGNOSIS — Z9861 Coronary angioplasty status: Secondary | ICD-10-CM | POA: Diagnosis not present

## 2014-06-04 DIAGNOSIS — IMO0001 Reserved for inherently not codable concepts without codable children: Secondary | ICD-10-CM | POA: Diagnosis not present

## 2014-06-04 DIAGNOSIS — R000 Tachycardia, unspecified: Secondary | ICD-10-CM | POA: Diagnosis not present

## 2014-06-04 DIAGNOSIS — I700 Atherosclerosis of aorta: Secondary | ICD-10-CM | POA: Diagnosis not present

## 2014-06-04 DIAGNOSIS — E785 Hyperlipidemia, unspecified: Secondary | ICD-10-CM | POA: Diagnosis present

## 2014-06-04 DIAGNOSIS — R55 Syncope and collapse: Secondary | ICD-10-CM | POA: Diagnosis not present

## 2014-06-04 DIAGNOSIS — Z87891 Personal history of nicotine dependence: Secondary | ICD-10-CM | POA: Diagnosis not present

## 2014-06-04 DIAGNOSIS — J449 Chronic obstructive pulmonary disease, unspecified: Secondary | ICD-10-CM | POA: Diagnosis present

## 2014-06-04 DIAGNOSIS — J811 Chronic pulmonary edema: Secondary | ICD-10-CM | POA: Diagnosis not present

## 2014-06-04 DIAGNOSIS — R918 Other nonspecific abnormal finding of lung field: Secondary | ICD-10-CM | POA: Diagnosis not present

## 2014-06-04 HISTORY — PX: CORONARY ARTERY BYPASS GRAFT: SHX141

## 2014-06-07 DIAGNOSIS — Z87891 Personal history of nicotine dependence: Secondary | ICD-10-CM | POA: Insufficient documentation

## 2014-06-21 ENCOUNTER — Encounter: Payer: Self-pay | Admitting: Pulmonary Disease

## 2014-06-26 DIAGNOSIS — Z951 Presence of aortocoronary bypass graft: Secondary | ICD-10-CM | POA: Diagnosis not present

## 2014-06-26 DIAGNOSIS — J90 Pleural effusion, not elsewhere classified: Secondary | ICD-10-CM | POA: Diagnosis not present

## 2014-06-26 DIAGNOSIS — I499 Cardiac arrhythmia, unspecified: Secondary | ICD-10-CM | POA: Diagnosis not present

## 2014-06-26 DIAGNOSIS — Z48812 Encounter for surgical aftercare following surgery on the circulatory system: Secondary | ICD-10-CM | POA: Diagnosis not present

## 2014-06-26 DIAGNOSIS — R918 Other nonspecific abnormal finding of lung field: Secondary | ICD-10-CM | POA: Diagnosis not present

## 2014-06-26 DIAGNOSIS — Z4802 Encounter for removal of sutures: Secondary | ICD-10-CM | POA: Diagnosis not present

## 2014-06-26 DIAGNOSIS — I498 Other specified cardiac arrhythmias: Secondary | ICD-10-CM | POA: Diagnosis not present

## 2014-07-10 DIAGNOSIS — I498 Other specified cardiac arrhythmias: Secondary | ICD-10-CM | POA: Diagnosis not present

## 2014-07-10 DIAGNOSIS — R079 Chest pain, unspecified: Secondary | ICD-10-CM | POA: Diagnosis not present

## 2014-07-10 DIAGNOSIS — I25810 Atherosclerosis of coronary artery bypass graft(s) without angina pectoris: Secondary | ICD-10-CM | POA: Diagnosis not present

## 2014-07-10 DIAGNOSIS — I2510 Atherosclerotic heart disease of native coronary artery without angina pectoris: Secondary | ICD-10-CM | POA: Diagnosis not present

## 2014-07-17 DIAGNOSIS — I498 Other specified cardiac arrhythmias: Secondary | ICD-10-CM | POA: Diagnosis not present

## 2014-07-18 ENCOUNTER — Ambulatory Visit: Payer: Medicare Other | Admitting: Internal Medicine

## 2014-07-23 ENCOUNTER — Ambulatory Visit (INDEPENDENT_AMBULATORY_CARE_PROVIDER_SITE_OTHER): Payer: Medicare Other | Admitting: Adult Health

## 2014-07-23 ENCOUNTER — Encounter: Payer: Self-pay | Admitting: Adult Health

## 2014-07-23 VITALS — BP 124/60 | HR 72 | Temp 97.6°F | Resp 12 | Ht 66.0 in | Wt 162.8 lb

## 2014-07-23 DIAGNOSIS — Z7689 Persons encountering health services in other specified circumstances: Secondary | ICD-10-CM

## 2014-07-23 DIAGNOSIS — I25810 Atherosclerosis of coronary artery bypass graft(s) without angina pectoris: Secondary | ICD-10-CM | POA: Diagnosis not present

## 2014-07-23 DIAGNOSIS — Z7189 Other specified counseling: Secondary | ICD-10-CM | POA: Diagnosis not present

## 2014-07-23 DIAGNOSIS — Z79899 Other long term (current) drug therapy: Secondary | ICD-10-CM

## 2014-07-23 NOTE — Progress Notes (Signed)
Patient ID: John Burns, male   DOB: 08-01-1941, 73 y.o.   MRN: [id number]   Subjective:    Patient ID: John Burns, male    DOB: 1941-10-16, 73 y.o.   MRN: [id number]  HPI  Patient is a pleasant 73 year old male with hx of HLD, ASCVD s/p CABG (06/04/14 at [facility]), CAD, GERD, COPD, hx of tobacco abuse who presents to clinic to establish care. He is followed by Dr. Saralyn Pilar at [place] [facility] and will be participating in cardiac rehab. He will call Dr. Saralyn Pilar office to follow up on when he is to begin. He is feeling well. Followed by Dr. Lake Bells for COPD.  Pt is requesting refills on his atorvastatin and his diltiazem. He reports taking his medications as prescribed without any noted side effects.   Past Medical History  Diagnosis Date  . Hypercholesteremia   . Heart attack   . COPD (chronic obstructive pulmonary disease)   . GERD (gastroesophageal reflux disease)   . CAD (coronary artery disease) 2007    s/p PCI to mid LAD  . Stenosis of coronary stent 2013  . History of tobacco abuse   . ASCVD (arteriosclerotic cardiovascular disease)   . Diverticulosis   . Bladder neck obstruction      Past Surgical History  Procedure Laterality Date  . Coronary stent placement  2007  . Angioplasty / stenting femoral    . Cardiac catheterization  05/10/12    patent LAD stent w/ 50% in stent restenosis; 70% stenosis ostium of left circumflex; 75% stenosis mid left circumflex  . Colonoscopy  02/05/14  . Esophagogastroduodenoscopy  02/05/14  . Coronary artery bypass graft  06/04/14    x2 [facility]    Family History  Problem Relation Age of Onset  . Heart disease Mother   . Cancer Father     prostate and colon cancer    History   Social History  . Marital Status: Married    Spouse Name: N/A    Number of Children: N/A  . Years of Education: N/A   Occupational History  . Not on file.   Social History Main Topics  . Smoking status: Former Smoker -- 1.00 packs/day for 40  years    Types: Cigarettes    Quit date: 11/22/1993  . Smokeless tobacco: Former Systems developer    Types: Chew     Comment: used chew to help quit smoking X1 year.  . Alcohol Use: No  . Drug Use: No  . Sexual Activity: Not on file   Other Topics Concern  . Not on file   Social History Narrative  . No narrative on file     Current Outpatient Prescriptions on File Prior to Visit  Medication Sig Dispense Refill  . aspirin 81 MG tablet Take 81 mg by mouth daily.      Marland Kitchen omeprazole (PRILOSEC) 20 MG capsule Take 20 mg by mouth daily.      . vitamin E 400 UNIT capsule Take 400 Units by mouth daily.       No current facility-administered medications on file prior to visit.     Review of Systems  Constitutional: Negative.   HENT: Negative.   Eyes: Negative.   Respiratory: Negative.   Cardiovascular:       Sternal pain s/p CABG  Gastrointestinal: Negative.   Endocrine: Negative.   Genitourinary: Negative.   Musculoskeletal: Negative.   Skin: Negative.   Allergic/Immunologic: Negative.   Neurological: Negative.   Hematological: Negative.   Psychiatric/Behavioral: Negative.  Objective:  BP 124/60  Pulse 72  Temp(Src) 97.6 F (36.4 C) (Oral)  Resp 12  Ht 5\' 6"  (1.676 m)  Wt 162 lb 12 oz (73.823 kg)  BMI 26.28 kg/m2  SpO2 97%   Physical Exam  Constitutional: He is oriented to person, place, and time. He appears well-developed and well-nourished. No distress.  HENT:  Head: Normocephalic and atraumatic.  Eyes: Conjunctivae and EOM are normal.  Cardiovascular: Normal rate, regular rhythm, normal heart sounds and intact distal pulses.  Exam reveals no gallop and no friction rub.   No murmur heard. Pulmonary/Chest: Effort normal and breath sounds normal. No respiratory distress. He has no wheezes. He has no rales.  Musculoskeletal: Normal range of motion.  Neurological: He is alert and oriented to person, place, and time.  Skin: Skin is warm and dry.     Psychiatric: He  has a normal mood and affect. His behavior is normal. Judgment and thought content normal.      Assessment & Plan:   1. Medication management Medications reviewed. Reported compliance. No side effects. Refills provided for atorvastatin and diltiazem.  2. Encounter to establish care Pt presents to establish care. He will be following up with Dr. Derrel Nip in 3 months. Starting cardiac rehab following CABG x 2 on 06/04/14. Doing well. Review of patient medical, surgical hx, review of outside medical records, medications.

## 2014-07-23 NOTE — Progress Notes (Signed)
Pre visit review using our clinic review tool, if applicable. No additional management support is needed unless otherwise documented below in the visit note. 

## 2014-08-05 ENCOUNTER — Other Ambulatory Visit: Payer: Self-pay | Admitting: *Deleted

## 2014-08-05 MED ORDER — OMEPRAZOLE 20 MG PO CPDR: 20.0000 mg | DELAYED_RELEASE_CAPSULE | Freq: Every day | ORAL | Status: DC

## 2014-08-20 ENCOUNTER — Telehealth: Payer: Self-pay | Admitting: Adult Health

## 2014-08-20 MED ORDER — ATORVASTATIN CALCIUM 20 MG PO TABS: 20.0000 mg | ORAL_TABLET | Freq: Every day | ORAL | Status: DC

## 2014-08-20 NOTE — Telephone Encounter (Signed)
Patient called for refill on Lipitor refill sent to last until appointment on 10/23/14

## 2014-08-21 DIAGNOSIS — R5381 Other malaise: Secondary | ICD-10-CM | POA: Diagnosis not present

## 2014-08-21 DIAGNOSIS — Z951 Presence of aortocoronary bypass graft: Secondary | ICD-10-CM | POA: Diagnosis not present

## 2014-08-21 DIAGNOSIS — R5383 Other fatigue: Secondary | ICD-10-CM | POA: Diagnosis not present

## 2014-08-21 DIAGNOSIS — R42 Dizziness and giddiness: Secondary | ICD-10-CM | POA: Diagnosis not present

## 2014-08-21 DIAGNOSIS — K219 Gastro-esophageal reflux disease without esophagitis: Secondary | ICD-10-CM | POA: Diagnosis not present

## 2014-08-21 DIAGNOSIS — R002 Palpitations: Secondary | ICD-10-CM | POA: Diagnosis not present

## 2014-08-21 DIAGNOSIS — I2510 Atherosclerotic heart disease of native coronary artery without angina pectoris: Secondary | ICD-10-CM | POA: Diagnosis not present

## 2014-08-21 DIAGNOSIS — J449 Chronic obstructive pulmonary disease, unspecified: Secondary | ICD-10-CM | POA: Diagnosis not present

## 2014-08-21 DIAGNOSIS — I498 Other specified cardiac arrhythmias: Secondary | ICD-10-CM | POA: Diagnosis not present

## 2014-08-22 DIAGNOSIS — I471 Supraventricular tachycardia: Secondary | ICD-10-CM | POA: Diagnosis not present

## 2014-08-28 ENCOUNTER — Ambulatory Visit: Payer: Medicare Other

## 2014-08-28 DIAGNOSIS — Z23 Encounter for immunization: Secondary | ICD-10-CM

## 2014-09-17 ENCOUNTER — Encounter: Payer: Self-pay | Admitting: Cardiology

## 2014-09-17 DIAGNOSIS — Z951 Presence of aortocoronary bypass graft: Secondary | ICD-10-CM | POA: Diagnosis not present

## 2014-09-17 DIAGNOSIS — Z09 Encounter for follow-up examination after completed treatment for conditions other than malignant neoplasm: Secondary | ICD-10-CM | POA: Diagnosis not present

## 2014-09-22 ENCOUNTER — Encounter: Payer: Self-pay | Admitting: Cardiology

## 2014-09-22 DIAGNOSIS — Z951 Presence of aortocoronary bypass graft: Secondary | ICD-10-CM | POA: Diagnosis not present

## 2014-09-27 ENCOUNTER — Telehealth: Payer: Self-pay | Admitting: Adult Health

## 2014-10-09 DIAGNOSIS — Z9889 Other specified postprocedural states: Secondary | ICD-10-CM | POA: Insufficient documentation

## 2014-10-09 DIAGNOSIS — I25810 Atherosclerosis of coronary artery bypass graft(s) without angina pectoris: Secondary | ICD-10-CM | POA: Diagnosis not present

## 2014-10-09 DIAGNOSIS — I471 Supraventricular tachycardia: Secondary | ICD-10-CM | POA: Diagnosis not present

## 2014-10-09 DIAGNOSIS — I2510 Atherosclerotic heart disease of native coronary artery without angina pectoris: Secondary | ICD-10-CM | POA: Diagnosis not present

## 2014-10-22 ENCOUNTER — Encounter: Payer: Self-pay | Admitting: Cardiology

## 2014-10-22 DIAGNOSIS — Z951 Presence of aortocoronary bypass graft: Secondary | ICD-10-CM | POA: Diagnosis not present

## 2014-10-23 ENCOUNTER — Ambulatory Visit: Payer: Medicare Other | Admitting: Internal Medicine

## 2014-10-23 DIAGNOSIS — Z951 Presence of aortocoronary bypass graft: Secondary | ICD-10-CM | POA: Diagnosis not present

## 2014-10-25 DIAGNOSIS — Z951 Presence of aortocoronary bypass graft: Secondary | ICD-10-CM | POA: Diagnosis not present

## 2014-10-28 DIAGNOSIS — Z951 Presence of aortocoronary bypass graft: Secondary | ICD-10-CM | POA: Diagnosis not present

## 2014-10-30 DIAGNOSIS — Z951 Presence of aortocoronary bypass graft: Secondary | ICD-10-CM | POA: Diagnosis not present

## 2014-11-01 DIAGNOSIS — Z951 Presence of aortocoronary bypass graft: Secondary | ICD-10-CM | POA: Diagnosis not present

## 2014-11-04 DIAGNOSIS — Z951 Presence of aortocoronary bypass graft: Secondary | ICD-10-CM | POA: Diagnosis not present

## 2014-11-06 DIAGNOSIS — Z951 Presence of aortocoronary bypass graft: Secondary | ICD-10-CM | POA: Diagnosis not present

## 2014-11-08 DIAGNOSIS — Z951 Presence of aortocoronary bypass graft: Secondary | ICD-10-CM | POA: Diagnosis not present

## 2014-11-11 DIAGNOSIS — Z951 Presence of aortocoronary bypass graft: Secondary | ICD-10-CM | POA: Diagnosis not present

## 2014-11-13 DIAGNOSIS — Z951 Presence of aortocoronary bypass graft: Secondary | ICD-10-CM | POA: Diagnosis not present

## 2014-11-18 DIAGNOSIS — Z951 Presence of aortocoronary bypass graft: Secondary | ICD-10-CM | POA: Diagnosis not present

## 2014-11-21 ENCOUNTER — Ambulatory Visit (INDEPENDENT_AMBULATORY_CARE_PROVIDER_SITE_OTHER): Payer: Medicare Other | Admitting: Internal Medicine

## 2014-11-21 ENCOUNTER — Encounter: Payer: Self-pay | Admitting: Internal Medicine

## 2014-11-21 VITALS — BP 152/70 | HR 60 | Temp 98.7°F | Resp 14 | Ht 66.0 in | Wt 167.8 lb

## 2014-11-21 DIAGNOSIS — Z125 Encounter for screening for malignant neoplasm of prostate: Secondary | ICD-10-CM | POA: Diagnosis not present

## 2014-11-21 DIAGNOSIS — E559 Vitamin D deficiency, unspecified: Secondary | ICD-10-CM | POA: Diagnosis not present

## 2014-11-21 DIAGNOSIS — E785 Hyperlipidemia, unspecified: Secondary | ICD-10-CM | POA: Diagnosis not present

## 2014-11-21 DIAGNOSIS — R7989 Other specified abnormal findings of blood chemistry: Secondary | ICD-10-CM

## 2014-11-21 DIAGNOSIS — Z79899 Other long term (current) drug therapy: Secondary | ICD-10-CM | POA: Diagnosis not present

## 2014-11-21 DIAGNOSIS — Z1211 Encounter for screening for malignant neoplasm of colon: Secondary | ICD-10-CM

## 2014-11-21 DIAGNOSIS — I25810 Atherosclerosis of coronary artery bypass graft(s) without angina pectoris: Secondary | ICD-10-CM | POA: Diagnosis not present

## 2014-11-21 DIAGNOSIS — J438 Other emphysema: Secondary | ICD-10-CM

## 2014-11-21 DIAGNOSIS — I25700 Atherosclerosis of coronary artery bypass graft(s), unspecified, with unstable angina pectoris: Secondary | ICD-10-CM

## 2014-11-21 DIAGNOSIS — J439 Emphysema, unspecified: Secondary | ICD-10-CM

## 2014-11-21 DIAGNOSIS — R946 Abnormal results of thyroid function studies: Secondary | ICD-10-CM | POA: Diagnosis not present

## 2014-11-21 LAB — COMPREHENSIVE METABOLIC PANEL
ALT: 19 U/L (ref 0–53)
AST: 24 U/L (ref 0–37)
Albumin: 4 g/dL (ref 3.5–5.2)
Alkaline Phosphatase: 133 U/L — ABNORMAL HIGH (ref 39–117)
BILIRUBIN TOTAL: 0.6 mg/dL (ref 0.2–1.2)
BUN: 11 mg/dL (ref 6–23)
CALCIUM: 9.6 mg/dL (ref 8.4–10.5)
CHLORIDE: 109 meq/L (ref 96–112)
CO2: 25 meq/L (ref 19–32)
CREATININE: 1 mg/dL (ref 0.4–1.5)
GFR: 75.87 mL/min (ref >60.00)
Glucose, Bld: 90 mg/dL (ref 70–99)
Potassium: 5.3 mEq/L — ABNORMAL HIGH (ref 3.5–5.1)
Sodium: 139 mEq/L (ref 135–145)
Total Protein: 6.5 g/dL (ref 6.0–8.3)

## 2014-11-21 LAB — PSA, MEDICARE: PSA: 2.97 ng/ml (ref 0.10–4.00)

## 2014-11-21 LAB — VITAMIN D 25 HYDROXY (VIT D DEFICIENCY, FRACTURES): VITD: 16.06 ng/mL — ABNORMAL LOW (ref 30.00–100.00)

## 2014-11-21 MED ORDER — METOPROLOL TARTRATE 50 MG PO TABS: 50.0000 mg | ORAL_TABLET | Freq: Every day | ORAL | Status: DC

## 2014-11-21 MED ORDER — DILTIAZEM HCL ER COATED BEADS 180 MG PO CP24: 180.0000 mg | ORAL_CAPSULE | Freq: Every day | ORAL | Status: DC

## 2014-11-21 MED ORDER — ATORVASTATIN CALCIUM 20 MG PO TABS: 20.0000 mg | ORAL_TABLET | Freq: Every day | ORAL | Status: DC

## 2014-11-21 MED ORDER — OMEPRAZOLE 20 MG PO CPDR: 20.0000 mg | DELAYED_RELEASE_CAPSULE | Freq: Every day | ORAL | Status: DC

## 2014-11-21 NOTE — Patient Instructions (Signed)
Referral to Dr Lake Bells is underway to get your lung function assessed.  Keep walking and working on your posture.  You can use aleve or motrin once or twice daily AND tylenol twice daily for aches and pains.  Return in 6 months for your annual wellness exam

## 2014-11-21 NOTE — Progress Notes (Signed)
Patient ID: John Burns, male   DOB: 11-19-1941, 73 y.o.   MRN: [id number]  Patient Active Problem List   Diagnosis Date Noted  . COPD (chronic obstructive pulmonary disease) 04/26/2014  . CAD (coronary artery disease) of artery bypass graft 04/26/2014    Subjective:  CC:   Chief Complaint  Patient presents with  . Follow-up    3 month    HPI:   John Burns is a 73 y.o. male who presents for follow up on chronic conditions.  He is a former patient of NP Raquel Rey.  Since his last OV he underwent a 2 vessel CABG in July followed by cardiac ablation at [facility] Oct 2015 for recurrent SVT.  He has seen Dr. Saralyn Pilar since his ablation and the dyspnea was attributed to his COPD. ECHO report is not available.   He has developed a stooped posture since his surgery due to discomfort from his sternotomy, but is making an effort to square his shoulders when walking and sitting.    He continues to experience exertional dyspnea despite control of HR.  He was diagnosed with  COPD secondary to tobacco abuse by  former PCP Dr Ilene Qua [facility]) last year with office spirometry,  And was referred to 's Dr. Lake Bells who referred him to Cardiology due to urgent cardiac issues.  He has not had formal PFTs and is not using inhaled bronchodilators or steroids on a daily basis.     Past Medical History  Diagnosis Date  . Hypercholesteremia   . Heart attack   . COPD (chronic obstructive pulmonary disease)   . GERD (gastroesophageal reflux disease)   . CAD (coronary artery disease) 2007    s/p PCI to mid LAD  . Stenosis of coronary stent 2013  . History of tobacco abuse   . ASCVD (arteriosclerotic cardiovascular disease)   . Diverticulosis   . Bladder neck obstruction     Past Surgical History  Procedure Laterality Date  . Coronary stent placement  2007  . Angioplasty / stenting femoral    . Cardiac catheterization  05/10/12    patent LAD stent w/ 50% in stent restenosis; 70%  stenosis ostium of left circumflex; 75% stenosis mid left circumflex  . Colonoscopy  02/05/14  . Esophagogastroduodenoscopy  02/05/14  . Coronary artery bypass graft  06/04/14    x2 [facility]       The following portions of the patient's history were reviewed and updated as appropriate: Allergies, current medications, and problem list.    Review of Systems:   Patient denies headache, fevers, malaise, unintentional weight loss, skin rash, eye pain, sinus congestion and sinus pain, sore throat, dysphagia,  hemoptysis , cough, dyspnea, wheezing, chest pain, palpitations, orthopnea, edema, abdominal pain, nausea, melena, diarrhea, constipation, flank pain, dysuria, hematuria, urinary  Frequency, nocturia, numbness, tingling, seizures,  Focal weakness, Loss of consciousness,  Tremor, insomnia, depression, anxiety, and suicidal ideation.     History   Social History  . Marital Status: Married    Spouse Name: N/A    Number of Children: N/A  . Years of Education: N/A   Occupational History  . Not on file.   Social History Main Topics  . Smoking status: Former Smoker -- 1.00 packs/day for 50 years    Types: Cigarettes    Quit date: 11/22/1993  . Smokeless tobacco: Former Systems developer    Types: Chew     Comment: used chew to help quit smoking X1 year.  . Alcohol  Use: No  . Drug Use: No  . Sexual Activity: Not Currently   Other Topics Concern  . Not on file   Social History Narrative    Objective:  Filed Vitals:   11/21/14 1025  BP: 152/70  Pulse: 60  Temp: 98.7 F (37.1 C)  Resp: 14     General appearance: alert, cooperative and appears stated age Ears: normal TM's and external ear canals both ears Throat: lips, mucosa, and tongue normal; teeth and gums normal Neck: no adenopathy, no carotid bruit, supple, symmetrical, trachea midline and thyroid not enlarged, symmetric, no tenderness/mass/nodules Back: symmetric, no curvature. ROM normal. No CVA tenderness. Lungs:  clear to auscultation bilaterally Heart: regular rate and rhythm, S1, S2 normal, no murmur, click, rub or gallop Abdomen: soft, non-tender; bowel sounds normal; no masses,  no organomegaly Pulses: 2+ and symmetric Skin: Skin color, texture, turgor normal. No rashes or lesions Lymph nodes: Cervical, supraclavicular, and axillary nodes normal.  Assessment and Plan:  Problem List Items Addressed This Visit    CAD (coronary artery disease) of artery bypass graft    Local cardiologist is Paraschos,  EF is unknown despite review of all available records from [place] clinic and is therefore assumed to be WNL since his dyspnea is considered by Paraschos (per notes) to be secondary to COPD.  He was receiving cardiac rehab twice weekly.  Will refer for ongoing cardiopulmonary rehab at [place],  ASA and metoprolol.  Fasting labs have been ordered but are still pending.   No results found for: CHOL, HDL, LDLCALC, LDLDIRECT, TRIG, CHOLHDL       Relevant Medications      metoprolol (LOPRESSOR) tablet      diltiazem (CARDIZEM CD) 24 hr capsule      atorvastatin (LIPITOR) tablet   Other Relevant Orders      Ambulatory referral to Physical Therapy   COPD (chronic obstructive pulmonary disease)    Patient was advised to return to Dr Lake Bells for discussion of the results of formal PFTS that were done in July and targeted inhaled therapy.  He may be a good candidate for pulmonary rehabilitation,  Which I will order today. .     Relevant Orders      Ambulatory referral to Pulmonology      Ambulatory referral to Physical Therapy      Ambulatory referral to Physical Therapy    Other Visit Diagnoses    Abnormal thyroid blood test    -  Primary    Relevant Orders       TSH    Hyperlipidemia        Relevant Medications       metoprolol (LOPRESSOR) tablet       diltiazem (CARDIZEM CD) 24 hr capsule       atorvastatin (LIPITOR) tablet    Other Relevant Orders       Lipid panel     Long-term use of high-risk medication        Relevant Orders       Comprehensive metabolic panel (Completed)    Screen for colon cancer        Screening for prostate cancer        Relevant Orders       PSA, Medicare (Completed)    Vitamin D deficiency        Relevant Orders       Vit D  25 hydroxy (rtn osteoporosis monitoring) (Completed)    Coronary artery disease  involving coronary bypass graft of native heart without angina pectoris        Relevant Medications       metoprolol (LOPRESSOR) tablet       diltiazem (CARDIZEM CD) 24 hr capsule       atorvastatin (LIPITOR) tablet    Other Relevant Orders       Ambulatory referral to Physical Therapy      A total of 40 minutes was spent with patient more than half of which was spent in counseling patient on the above mentioned issues , reviewing and explaining recent labs and imaging studies done, and coordination of care.

## 2014-11-21 NOTE — Progress Notes (Signed)
Pre-visit discussion using our clinic review tool. No additional management support is needed unless otherwise documented below in the visit note.  

## 2014-11-23 ENCOUNTER — Encounter: Payer: Self-pay | Admitting: Internal Medicine

## 2014-11-23 NOTE — Assessment & Plan Note (Signed)
Patient was advised to return to Dr Lake Bells for discussion of the results of formal PFTS that were done in July and targeted inhaled therapy.  He may be a good candidate for pulmonary rehabilitation,  Which I will order today. John Burns

## 2014-11-23 NOTE — Assessment & Plan Note (Addendum)
Local cardiologist is Paraschos,  EF is unknown despite review of all available records from [place] and [place] clinic and is therefore assumed to be WNL since his dyspnea is considered by Paraschos (per notes) to be secondary to COPD.  He was receiving cardiac rehab twice weekly.  Will refer for ongoing cardiopulmonary rehab at [place],  ASA and metoprolol.  Fasting labs have been ordered but are still pending.   No results found for: CHOL, HDL, LDLCALC, LDLDIRECT, TRIG, CHOLHDL

## 2014-11-24 MED ORDER — ERGOCALCIFEROL 1.25 MG (50000 UT) PO CAPS: 50000.0000 [IU] | ORAL_CAPSULE | ORAL | Status: DC

## 2014-11-24 NOTE — Addendum Note (Signed)
Addended by: Crecencio Mc on: 11/24/2014 10:14 PM   Modules accepted: Orders

## 2014-11-25 ENCOUNTER — Telehealth: Payer: Self-pay | Admitting: *Deleted

## 2014-11-25 DIAGNOSIS — E785 Hyperlipidemia, unspecified: Secondary | ICD-10-CM

## 2014-11-25 DIAGNOSIS — R5383 Other fatigue: Secondary | ICD-10-CM

## 2014-11-25 DIAGNOSIS — E875 Hyperkalemia: Secondary | ICD-10-CM

## 2014-11-25 LAB — TSH: TSH: 3.47 u[IU]/mL (ref 0.35–4.50)

## 2014-11-25 NOTE — Telephone Encounter (Signed)
Dr.Tullo pt 

## 2014-11-25 NOTE — Telephone Encounter (Signed)
Pt is coming tomorrow what labs and dx?  

## 2014-11-26 ENCOUNTER — Encounter: Payer: Self-pay | Admitting: Pulmonary Disease

## 2014-11-26 ENCOUNTER — Ambulatory Visit (INDEPENDENT_AMBULATORY_CARE_PROVIDER_SITE_OTHER): Payer: Medicare Other | Admitting: Pulmonary Disease

## 2014-11-26 ENCOUNTER — Other Ambulatory Visit (INDEPENDENT_AMBULATORY_CARE_PROVIDER_SITE_OTHER): Payer: Medicare Other

## 2014-11-26 VITALS — BP 138/72 | HR 67 | Ht 67.0 in | Wt 175.0 lb

## 2014-11-26 DIAGNOSIS — I25810 Atherosclerosis of coronary artery bypass graft(s) without angina pectoris: Secondary | ICD-10-CM

## 2014-11-26 DIAGNOSIS — E875 Hyperkalemia: Secondary | ICD-10-CM

## 2014-11-26 LAB — BASIC METABOLIC PANEL
BUN: 14 mg/dL (ref 6–23)
CO2: 24 mEq/L (ref 19–32)
Calcium: 9.4 mg/dL (ref 8.4–10.5)
Chloride: 110 mEq/L (ref 96–112)
Creatinine, Ser: 1.1 mg/dL (ref 0.4–1.5)
GFR: 68.82 mL/min (ref >60.00)
Glucose, Bld: 85 mg/dL (ref 70–99)
Potassium: 5 mEq/L (ref 3.5–5.1)
Sodium: 141 mEq/L (ref 135–145)

## 2014-11-26 LAB — LIPID PANEL
CHOLESTEROL: 158 mg/dL (ref 0–200)
HDL: 43 mg/dL (ref >39.00)
LDL CALC: 93 mg/dL (ref 0–99)
NonHDL: 115
TRIGLYCERIDES: 110 mg/dL (ref 0.0–149.0)
Total CHOL/HDL Ratio: 4
VLDL: 22 mg/dL (ref 0.0–40.0)

## 2014-11-26 NOTE — Progress Notes (Signed)
   Subjective:    Patient ID: John Burns, male    DOB: 30-Jun-1941, 74 y.o.   MRN: [id number]  Referred in 04/2014 for dyspnea: 05/23/2014 ROV > Ratio 80%, FEV1 3.26L (127% pred, -2% change with BD), TLC 5.68 L (99% pred), DLCO 18.9 (103% pred) He has significant coronary artery disease and had a CABG in 05/2014 and his dyspnea went away.   HPI Chief Complaint  Patient presents with  . Follow-up    pt c/o some sob with exertion, prod cough with gray mucus.  has open heart sx since last visit, in July 2015, and had an ablation in October.     John Burns says he has been feeling well since the cardiac surgery,, improved breathing since then.  He says that he continues to have some dyspnea when he bends over.  He had a CABG in July and did cardiac rehab here in [place].  No cough.  He finished rehab a week ago.  Occasional sputum in the mornings, but minimal.    Past Medical History  Diagnosis Date  . Hypercholesteremia   . Heart attack   . COPD (chronic obstructive pulmonary disease)   . GERD (gastroesophageal reflux disease)   . CAD (coronary artery disease) 2007    s/p PCI to mid LAD  . Stenosis of coronary stent 2013  . History of tobacco abuse   . ASCVD (arteriosclerotic cardiovascular disease)   . Diverticulosis   . Bladder neck obstruction       Review of Systems     Objective:   Physical Exam Filed Vitals:   11/26/14 0956 11/26/14 1000  BP: 138/72   Pulse:  67  Height: 5\' 7"  (1.702 m)   Weight: 175 lb (79.379 kg)   SpO2:  98%   Gen: well appearing, no acute distress HEENT: NCAT, EOMi, OP clear PULM: CTA B CV: RRR, no mgr, no JVD AB: BS+, soft, nontender Ext: warm, no edema, no clubbing, no cyanosis Derm: no rash or skin breakdown Neuro: A&Ox4, MAEW        Assessment & Plan:   CAD (coronary artery disease) of artery bypass graft His dyspnea resolved after having his CABG and ablation last year.  His lung function test, lung exam, and CXR in 2015 were  all normal and his lungs and oxygenation today were normal on my exam.  So I don't see evidence of lung disease. I recommended that he follow up with cardiology and with me on an as needed basis.  No need for inhalers.   Updated Medication List Outpatient Encounter Prescriptions as of 11/26/2014  Medication Sig  . aspirin 81 MG tablet Take 81 mg by mouth daily.  Marland Kitchen atorvastatin (LIPITOR) 20 MG tablet Take 1 tablet (20 mg total) by mouth daily at 6 PM.  . diltiazem (CARDIZEM CD) 180 MG 24 hr capsule Take 1 capsule (180 mg total) by mouth daily.  . metoprolol (LOPRESSOR) 50 MG tablet Take 1 tablet (50 mg total) by mouth daily.  Marland Kitchen omeprazole (PRILOSEC) 20 MG capsule Take 1 capsule (20 mg total) by mouth daily.  . vitamin E 400 UNIT capsule Take 400 Units by mouth daily.  . ergocalciferol (DRISDOL) 50000 UNITS capsule Take 1 capsule (50,000 Units total) by mouth once a week. (Patient not taking: Reported on 11/26/2014)

## 2014-11-26 NOTE — Patient Instructions (Signed)
You don't have lung disease We will see you back as needed

## 2014-11-26 NOTE — Assessment & Plan Note (Signed)
His dyspnea resolved after having his CABG and ablation last year.  His lung function test, lung exam, and CXR in 2015 were all normal and his lungs and oxygenation today were normal on my exam.  So I don't see evidence of lung disease. I recommended that he follow up with cardiology and with me on an as needed basis.  No need for inhalers.

## 2014-12-16 DIAGNOSIS — H2513 Age-related nuclear cataract, bilateral: Secondary | ICD-10-CM | POA: Diagnosis not present

## 2014-12-18 ENCOUNTER — Ambulatory Visit (INDEPENDENT_AMBULATORY_CARE_PROVIDER_SITE_OTHER): Payer: Medicare Other | Admitting: *Deleted

## 2014-12-18 DIAGNOSIS — Z23 Encounter for immunization: Secondary | ICD-10-CM

## 2015-02-03 ENCOUNTER — Telehealth: Payer: Self-pay | Admitting: Adult Health

## 2015-02-03 MED ORDER — ATORVASTATIN CALCIUM 20 MG PO TABS: 20.0000 mg | ORAL_TABLET | Freq: Every day | ORAL | Status: DC

## 2015-02-03 NOTE — Telephone Encounter (Signed)
atorvastatin (LIPITOR) 20 MG tablet

## 2015-02-05 DIAGNOSIS — I471 Supraventricular tachycardia: Secondary | ICD-10-CM | POA: Diagnosis not present

## 2015-02-05 DIAGNOSIS — R079 Chest pain, unspecified: Secondary | ICD-10-CM | POA: Diagnosis not present

## 2015-02-05 DIAGNOSIS — I25810 Atherosclerosis of coronary artery bypass graft(s) without angina pectoris: Secondary | ICD-10-CM | POA: Diagnosis not present

## 2015-02-05 DIAGNOSIS — I2510 Atherosclerotic heart disease of native coronary artery without angina pectoris: Secondary | ICD-10-CM | POA: Diagnosis not present

## 2015-05-21 DIAGNOSIS — Z1283 Encounter for screening for malignant neoplasm of skin: Secondary | ICD-10-CM | POA: Diagnosis not present

## 2015-05-21 DIAGNOSIS — L570 Actinic keratosis: Secondary | ICD-10-CM | POA: Diagnosis not present

## 2015-05-22 ENCOUNTER — Encounter: Payer: Medicare Other | Admitting: Nurse Practitioner

## 2015-05-22 ENCOUNTER — Ambulatory Visit (INDEPENDENT_AMBULATORY_CARE_PROVIDER_SITE_OTHER): Payer: Medicare Other | Admitting: Internal Medicine

## 2015-05-22 ENCOUNTER — Encounter: Payer: Self-pay | Admitting: Internal Medicine

## 2015-05-22 VITALS — BP 114/70 | HR 56 | Temp 97.5°F | Resp 14 | Ht 66.25 in | Wt 164.0 lb

## 2015-05-22 DIAGNOSIS — K5790 Diverticulosis of intestine, part unspecified, without perforation or abscess without bleeding: Secondary | ICD-10-CM | POA: Insufficient documentation

## 2015-05-22 DIAGNOSIS — Z0000 Encounter for general adult medical examination without abnormal findings: Secondary | ICD-10-CM

## 2015-05-22 DIAGNOSIS — Z125 Encounter for screening for malignant neoplasm of prostate: Secondary | ICD-10-CM

## 2015-05-22 DIAGNOSIS — Z1211 Encounter for screening for malignant neoplasm of colon: Secondary | ICD-10-CM | POA: Insufficient documentation

## 2015-05-22 DIAGNOSIS — Z79899 Other long term (current) drug therapy: Secondary | ICD-10-CM | POA: Diagnosis not present

## 2015-05-22 DIAGNOSIS — K2270 Barrett's esophagus without dysplasia: Secondary | ICD-10-CM | POA: Diagnosis not present

## 2015-05-22 DIAGNOSIS — E559 Vitamin D deficiency, unspecified: Secondary | ICD-10-CM

## 2015-05-22 DIAGNOSIS — Z1159 Encounter for screening for other viral diseases: Secondary | ICD-10-CM

## 2015-05-22 DIAGNOSIS — N320 Bladder-neck obstruction: Secondary | ICD-10-CM | POA: Insufficient documentation

## 2015-05-22 DIAGNOSIS — I2510 Atherosclerotic heart disease of native coronary artery without angina pectoris: Secondary | ICD-10-CM

## 2015-05-22 DIAGNOSIS — N529 Male erectile dysfunction, unspecified: Secondary | ICD-10-CM

## 2015-05-22 DIAGNOSIS — R748 Abnormal levels of other serum enzymes: Secondary | ICD-10-CM

## 2015-05-22 LAB — VITAMIN D 25 HYDROXY (VIT D DEFICIENCY, FRACTURES): VITD: 31.37 ng/mL (ref 30.00–100.00)

## 2015-05-22 LAB — COMPREHENSIVE METABOLIC PANEL
ALT: 13 U/L (ref 0–53)
AST: 19 U/L (ref 0–37)
Albumin: 4.1 g/dL (ref 3.5–5.2)
Alkaline Phosphatase: 129 U/L — ABNORMAL HIGH (ref 39–117)
BUN: 17 mg/dL (ref 6–23)
CO2: 27 mEq/L (ref 19–32)
Calcium: 9.4 mg/dL (ref 8.4–10.5)
Chloride: 106 mEq/L (ref 96–112)
Creatinine, Ser: 1.05 mg/dL (ref 0.40–1.50)
GFR: 73.28 mL/min (ref >60.00)
Glucose, Bld: 84 mg/dL (ref 70–99)
Potassium: 4.8 mEq/L (ref 3.5–5.1)
Sodium: 138 mEq/L (ref 135–145)
TOTAL PROTEIN: 6.5 g/dL (ref 6.0–8.3)
Total Bilirubin: 0.6 mg/dL (ref 0.2–1.2)

## 2015-05-22 LAB — LIPID PANEL
CHOL/HDL RATIO: 3
Cholesterol: 132 mg/dL (ref 0–200)
HDL: 46.5 mg/dL (ref >39.00)
LDL Cholesterol: 76 mg/dL (ref 0–99)
NonHDL: 85.5
Triglycerides: 46 mg/dL (ref 0.0–149.0)
VLDL: 9.2 mg/dL (ref 0.0–40.0)

## 2015-05-22 LAB — PSA, MEDICARE: PSA: 2.33 ng/ml (ref 0.10–4.00)

## 2015-05-22 MED ORDER — ATORVASTATIN CALCIUM 20 MG PO TABS: 20.0000 mg | ORAL_TABLET | Freq: Every day | ORAL | Status: DC

## 2015-05-22 MED ORDER — DILTIAZEM HCL ER COATED BEADS 180 MG PO CP24: 180.0000 mg | ORAL_CAPSULE | Freq: Every day | ORAL | Status: DC

## 2015-05-22 MED ORDER — METOPROLOL TARTRATE 50 MG PO TABS: 50.0000 mg | ORAL_TABLET | Freq: Every day | ORAL | Status: DC

## 2015-05-22 MED ORDER — SILDENAFIL CITRATE 100 MG PO TABS: 100.0000 mg | ORAL_TABLET | Freq: Every day | ORAL | Status: DC | PRN

## 2015-05-22 NOTE — Progress Notes (Signed)
Pre-visit discussion using our clinic review tool. No additional management support is needed unless otherwise documented below in the visit note.  

## 2015-05-22 NOTE — Progress Notes (Signed)
Patient ID: John Burns, male    DOB: 07-02-1941  Age: 74 y.o. MRN: [id number]  The patient is here for annual Medicare wellness examination and management of other chronic and acute problems.   The risk factors are reflected in the social history.  The roster of all physicians providing medical care to patient - is listed in the Snapshot section of the chart.  Activities of daily living:  The patient is 100% independent in all ADLs: dressing, toileting, feeding as well as independent mobility  Home safety : The patient has smoke detectors in the home. They wear seatbelts.  There are no firearms at home. There is no violence in the home.   There is no risks for hepatitis, STDs or HIV. There is no   history of blood transfusion. They have no travel history to infectious disease endemic areas of the world.  The patient has seen their dentist in the last six month. They have seen their eye doctor in the last year. They admit to slight hearing difficulty with regard to whispered voices and some television programs.  They have deferred audiologic testing in the last year.  They do not  have excessive sun exposure. Discussed the need for sun protection: hats, long sleeves and use of sunscreen if there is significant sun exposure.   Diet: the importance of a healthy diet is discussed. They do have a healthy diet.  The benefits of regular aerobic exercise were discussed. She walks 4 times per week ,  20 minutes.   Depression screen: there are no signs or vegative symptoms of depression- irritability, change in appetite, anhedonia, sadness/tearfullness.  Cognitive assessment: the patient manages all their financial and personal affairs and is actively engaged. They could relate day,date,year and events; recalled 2/3 objects at 3 minutes; performed clock-face test normally.  The following portions of the patient's history were reviewed and updated as appropriate: allergies, current medications, past  family history, past medical history,  past surgical history, past social history  and problem list.  Visual acuity was not assessed per patient preference since she has regular follow up with her ophthalmologist. Hearing and body mass index were assessed and reviewed.   During the course of the visit the patient was educated and counseled about appropriate screening and preventive services including : fall prevention , diabetes screening, nutrition counseling, colorectal cancer screening, and recommended immunizations.    CC: The primary encounter diagnosis was Barrett's esophagus. 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  Patient was sent back to pulmonary after last visit, NO LUNG DISEASE PER PULMONOLOGY.  CARDIAOLOGY STILL ATTRIBUTING HIS DYSPNEA TO COPD PER OV from [place],    History Randon has a past medical history of Hypercholesteremia; Heart attack; COPD (chronic obstructive pulmonary disease); GERD (gastroesophageal reflux disease); CAD (coronary artery disease) (2007); Stenosis of coronary stent (2013); History of tobacco abuse; ASCVD (arteriosclerotic cardiovascular disease); Diverticulosis; and Bladder neck obstruction.   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   His family history includes Cancer (age of onset: 58) in his father; Heart disease in his mother.He reports that he quit smoking about 21 years ago. His smoking use included Cigarettes. He has  a 50 pack-year smoking history. He has quit using smokeless tobacco. His  smokeless tobacco use included Chew. He reports that he does not drink alcohol or use illicit drugs.  Outpatient Prescriptions Prior to Visit  Medication Sig Dispense Refill  . aspirin 81 MG tablet Take 81 mg by mouth daily.    . ergocalciferol (DRISDOL) 50000 UNITS capsule Take 1 capsule (50,000 Units total) by mouth once a week. 12 capsule 0  . omeprazole (PRILOSEC) 20 MG capsule Take 1 capsule (20 mg total) by mouth daily. 90 capsule 3  . vitamin E 400 UNIT capsule Take 400 Units by mouth daily.    Marland Kitchen atorvastatin (LIPITOR) 20 MG tablet Take 1 tablet (20 mg total) by mouth daily at 6 PM. 90 tablet 1  . diltiazem (CARDIZEM CD) 180 MG 24 hr capsule Take 1 capsule (180 mg total) by mouth daily. 90 capsule 1  . metoprolol (LOPRESSOR) 50 MG tablet Take 1 tablet (50 mg total) by mouth daily. 90 tablet 1   No facility-administered medications prior to visit.    Review of Systems   Patient denies headache, fevers, malaise, unintentional weight loss, skin rash, eye pain, sinus congestion and sinus pain, sore throat, dysphagia,  hemoptysis , cough, dyspnea, wheezing, chest pain, palpitations, orthopnea, edema, abdominal pain, nausea, melena, diarrhea, constipation, flank pain, dysuria, hematuria, urinary  Frequency, nocturia, numbness, tingling, seizures,  Focal weakness, Loss of consciousness,  Tremor, insomnia, depression, anxiety, and suicidal ideation.      Objective:  BP 114/70 mmHg  Pulse 56  Temp(Src) 97.5 F (36.4 C) (Oral)  Resp 14  Ht 5' 6.25" (1.683 m)  Wt 164 lb (74.39 kg)  BMI 26.26 kg/m2  SpO2 98%  Physical Exam   BP 114/70 mmHg  Pulse 56  Temp(Src) 97.5 F (36.4 C) (Oral)  Resp 14  Ht 5' 6.25" (1.683 m)  Wt 164 lb (74.39 kg)  BMI 26.26 kg/m2  SpO2 98%  General Appearance:    Alert, cooperative, no distress, appears stated age  Head:    Normocephalic, without obvious abnormality, atraumatic  Eyes:    PERRL, conjunctiva/corneas clear, EOM's intact, fundi    benign,  both eyes       Ears:    Normal TM's and external ear canals, both ears  Nose:   Nares normal, septum midline, mucosa normal, no drainage   or sinus tenderness  Throat:   Lips, mucosa, and tongue normal; teeth and gums normal  Neck:   Supple, symmetrical, trachea midline, no adenopathy;       thyroid:  No enlargement/tenderness/nodules; no carotid   bruit or JVD  Back:     Symmetric, no curvature, ROM normal, no CVA tenderness  Lungs:     Clear to auscultation bilaterally, respirations unlabored  Chest wall:    No tenderness or deformity  Heart:    Regular rate and rhythm, S1 and S2 normal, no murmur, rub   or gallop  Abdomen:     Soft, non-tender, bowel sounds active all four quadrants,    no masses, no organomegaly  Genitalia:    Normal male without lesion, discharge or tenderness  Rectal:    Normal tone, normal prostate, no masses or tenderness;   guaiac negative stool  Extremities:   Extremities normal, atraumatic, no cyanosis or edema  Pulses:   2+ and symmetric all extremities  Skin:   Skin color, texture, turgor normal, no rashes or lesions  Lymph nodes:   Cervical, supraclavicular, and axillary nodes normal  Neurologic:   CNII-XII intact. Normal strength, sensation and reflexes      throughout      Assessment & Plan:   Problem List Items Addressed This Visit      Unprioritized   Barrett's esophagus - Primary    Advised to continue daily use of PPI      Medicare annual wellness visit, subsequent    Annual Medicare wellness  exam was done as well as a comprehensive physical exam and management of acute and chronic conditions .  During the course of the visit the patient was educated and counseled about appropriate screening and preventive services including : fall prevention , diabetes screening, nutrition counseling, colorectal cancer screening, and recommended immunizations.  Printed recommendations for health maintenance screenings was given.   Lab Results  Component  Value Date   CHOL 132 05/22/2015   HDL 46.50 05/22/2015   LDLCALC 76 05/22/2015   TRIG 46.0 05/22/2015   CHOLHDL 3 05/22/2015   Lab Results  Component Value Date   PSA 2.33 05/22/2015   PSA 2.97 11/21/2014         Arteriosclerosis of coronary artery   Relevant Medications   metoprolol (LOPRESSOR) 50 MG tablet   diltiazem (CARDIZEM CD) 180 MG 24 hr capsule   atorvastatin (LIPITOR) 20 MG tablet   sildenafil (VIAGRA) 100 MG tablet   Other Relevant Orders   Lipid panel (Completed)   Vitamin D deficiency   Relevant Orders   Vit D  25 hydroxy (rtn osteoporosis monitoring) (Completed)   Screening for prostate cancer   Relevant Orders   PSA, Medicare (Completed)   Need for hepatitis C screening test    Other Visit Diagnoses    Long-term use of high-risk medication        Relevant Orders    Comprehensive metabolic panel (Completed)    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 Kuang start on sildenafil. I am also having him maintain his aspirin, vitamin E, omeprazole, ergocalciferol, metoprolol, diltiazem, and atorvastatin.  Meds ordered this encounter  Medications  . metoprolol (LOPRESSOR) 50 MG tablet    Sig: Take 1 tablet (50 mg total) by mouth daily.    Dispense:  90 tablet    Refill:  1  . diltiazem (CARDIZEM CD) 180 MG 24 hr capsule    Sig: Take 1 capsule (180 mg total) by mouth daily.    Dispense:  90 capsule    Refill:  1  . atorvastatin (LIPITOR) 20 MG tablet    Sig: Take 1 tablet (20 mg total) by mouth daily at 6 PM.    Dispense:  90 tablet    Refill:  1  . sildenafil (VIAGRA) 100 MG tablet    Sig: Take 1 tablet (100 mg total) by mouth daily as needed for erectile dysfunction.    Dispense:  10 tablet    Refill:  3    Medications Discontinued During This Encounter  Medication Reason  . metoprolol (LOPRESSOR) 50 MG tablet Reorder  . diltiazem (CARDIZEM CD) 180 MG 24 hr capsule Reorder  . atorvastatin (LIPITOR) 20 MG tablet Reorder     Follow-up: Return in about 6 months (around 11/21/2015).   Crecencio Mc, MD

## 2015-05-22 NOTE — Patient Instructions (Addendum)
We will mail you the results of your labs today  Health Maintenance A healthy lifestyle and preventative care can promote health and wellness.  Maintain regular health, dental, and eye exams.  Eat a healthy diet. Foods like vegetables, fruits, whole grains, low-fat dairy products, and lean protein foods contain the nutrients you need and are low in calories. Decrease your intake of foods high in solid fats, added sugars, and salt. Get information about a proper diet from your health care provider, if necessary.  Regular physical exercise is one of the most important things you can do for your health. Most adults should get at least 150 minutes of moderate-intensity exercise (any activity that increases your heart rate and causes you to sweat) each week. In addition, most adults need muscle-strengthening exercises on 2 or more days a week.   Maintain a healthy weight. The body mass index (BMI) is a screening tool to identify possible weight problems. It provides an estimate of body fat based on height and weight. Your health care provider can find your BMI and can help you achieve or maintain a healthy weight. For males 20 years and older:  A BMI below 18.5 is considered underweight.  A BMI of 18.5 to 24.9 is normal.  A BMI of 25 to 29.9 is considered overweight.  A BMI of 30 and above is considered obese.  Maintain normal blood lipids and cholesterol by exercising and minimizing your intake of saturated fat. Eat a balanced diet with plenty of fruits and vegetables. Blood tests for lipids and cholesterol should begin at age 91 and be repeated every 5 years. If your lipid or cholesterol levels are high, you are over age 75, or you are at high risk for heart disease, you may need your cholesterol levels checked more frequently.Ongoing high lipid and cholesterol levels should be treated with medicines if diet and exercise are not working.  If you smoke, find out from your health care provider  how to quit. If you do not use tobacco, do not start.  Lung cancer screening is recommended for adults aged 70-80 years who are at high risk for developing lung cancer because of a history of smoking. A yearly low-dose CT scan of the lungs is recommended for people who have at least a 30-pack-year history of smoking and are current smokers or have quit within the past 15 years. A pack year of smoking is smoking an average of 1 pack of cigarettes a day for 1 year (for example, a 30-pack-year history of smoking could mean smoking 1 pack a day for 30 years or 2 packs a day for 15 years). Yearly screening should continue until the smoker has stopped smoking for at least 15 years. Yearly screening should be stopped for people who develop a health problem that would prevent them from having lung cancer treatment.  If you choose to drink alcohol, do not have more than 2 drinks per day. One drink is considered to be 12 oz (360 mL) of beer, 5 oz (150 mL) of wine, or 1.5 oz (45 mL) of liquor.  Avoid the use of street drugs. Do not share needles with anyone. Ask for help if you need support or instructions about stopping the use of drugs.  High blood pressure causes heart disease and increases the risk of stroke. Blood pressure should be checked at least every 1-2 years. Ongoing high blood pressure should be treated with medicines if weight loss and exercise are not effective.  If  you are 87-41 years old, ask your health care provider if you should take aspirin to prevent heart disease.  Diabetes screening involves taking a blood sample to check your fasting blood sugar level. This should be done once every 3 years after age 5 if you are at a normal weight and without risk factors for diabetes. Testing should be considered at a younger age or be carried out more frequently if you are overweight and have at least 1 risk factor for diabetes.  Colorectal cancer can be detected and often prevented. Most routine  colorectal cancer screening begins at the age of 64 and continues through age 48. However, your health care provider may recommend screening at an earlier age if you have risk factors for colon cancer. On a yearly basis, your health care provider may provide home test kits to check for hidden blood in the stool. A small camera at the end of a tube may be used to directly examine the colon (sigmoidoscopy or colonoscopy) to detect the earliest forms of colorectal cancer. Talk to your health care provider about this at age 4 when routine screening begins. A direct exam of the colon should be repeated every 5-10 years through age 54, unless early forms of precancerous polyps or small growths are found.  People who are at an increased risk for hepatitis B should be screened for this virus. You are considered at high risk for hepatitis B if:  You were born in a country where hepatitis B occurs often. Talk with your health care provider about which countries are considered high risk.  Your parents were born in a high-risk country and you have not received a shot to protect against hepatitis B (hepatitis B vaccine).  You have HIV or AIDS.  You use needles to inject street drugs.  You live with, or have sex with, someone who has hepatitis B.  You are a man who has sex with other men (MSM).  You get hemodialysis treatment.  You take certain medicines for conditions like cancer, organ transplantation, and autoimmune conditions.  Hepatitis C blood testing is recommended for all people born from [birth year] through 1966 and any individual with known risk factors for hepatitis C.  Healthy men should no longer receive prostate-specific antigen (PSA) blood tests as part of routine cancer screening. Talk to your health care provider about prostate cancer screening.  Testicular cancer screening is not recommended for adolescents or adult males who have no symptoms. Screening includes self-exam, a health care  provider exam, and other screening tests. Consult with your health care provider about any symptoms you have or any concerns you have about testicular cancer.  Practice safe sex. Use condoms and avoid high-risk sexual practices to reduce the spread of sexually transmitted infections (STIs).  You should be screened for STIs, including gonorrhea and chlamydia if:  You are sexually active and are younger than 24 years.  You are older than 24 years, and your health care provider tells you that you are at risk for this type of infection.  Your sexual activity has changed since you were last screened, and you are at an increased risk for chlamydia or gonorrhea. Ask your health care provider if you are at risk.  If you are at risk of being infected with HIV, it is recommended that you take a prescription medicine daily to prevent HIV infection. This is called pre-exposure prophylaxis (PrEP). You are considered at risk if:  You are a man who has sex  with other men (MSM).  You are a heterosexual man who is sexually active with multiple partners.  You take drugs by injection.  You are sexually active with a partner who has HIV.  Talk with your health care provider about whether you are at high risk of being infected with HIV. If you choose to begin PrEP, you should first be tested for HIV. You should then be tested every 3 months for as long as you are taking PrEP.  Use sunscreen. Apply sunscreen liberally and repeatedly throughout the day. You should seek shade when your shadow is shorter than you. Protect yourself by wearing long sleeves, pants, a wide-brimmed hat, and sunglasses year round whenever you are outdoors.  Tell your health care provider of new moles or changes in moles, especially if there is a change in shape or color. Also, tell your health care provider if a mole is larger than the size of a pencil eraser.  A one-time screening for abdominal aortic aneurysm (AAA) and surgical  repair of large AAAs by ultrasound is recommended for men aged 35-75 years who are current or former smokers.  Stay current with your vaccines (immunizations). Document Released: 05/06/2008 Document Revised: 11/13/2013 Document Reviewed: 04/05/2011 [facility] Patient Information 2015 [place], [place]. This information is not intended to replace advice given to you by your health care provider. Make sure you discuss any questions you have with your health care provider.

## 2015-05-24 DIAGNOSIS — Z0000 Encounter for general adult medical examination without abnormal findings: Secondary | ICD-10-CM | POA: Insufficient documentation

## 2015-05-24 NOTE — Assessment & Plan Note (Addendum)
Annual Medicare wellness  exam was done as well as a comprehensive physical exam and management of acute and chronic conditions .  During the course of the visit the patient was educated and counseled about appropriate screening and preventive services including : fall prevention , diabetes screening, nutrition counseling, colorectal cancer screening, and recommended immunizations.  Printed recommendations for health maintenance screenings was given.   Lab Results  Component Value Date   CHOL 132 05/22/2015   HDL 46.50 05/22/2015   LDLCALC 76 05/22/2015   TRIG 46.0 05/22/2015   CHOLHDL 3 05/22/2015   Lab Results  Component Value Date   PSA 2.33 05/22/2015   PSA 2.97 11/21/2014

## 2015-05-24 NOTE — Assessment & Plan Note (Signed)
Advised to continue daily use of PPI

## 2015-05-26 DIAGNOSIS — R748 Abnormal levels of other serum enzymes: Secondary | ICD-10-CM | POA: Insufficient documentation

## 2015-05-26 NOTE — Addendum Note (Signed)
Addended by: Crecencio Mc on: 05/26/2015 10:20 PM   Modules accepted: Orders, SmartSet

## 2015-06-11 ENCOUNTER — Ambulatory Visit
Admission: RE | Admit: 2015-06-11 | Discharge: 2015-06-11 | Disposition: A | Discharge status: Home / Self Care | Payer: Medicare Other | Source: Ambulatory Visit | Attending: Internal Medicine | Admitting: Internal Medicine

## 2015-06-11 DIAGNOSIS — K7689 Other specified diseases of liver: Secondary | ICD-10-CM | POA: Diagnosis not present

## 2015-06-11 DIAGNOSIS — R748 Abnormal levels of other serum enzymes: Secondary | ICD-10-CM | POA: Insufficient documentation

## 2015-08-05 DIAGNOSIS — J449 Chronic obstructive pulmonary disease, unspecified: Secondary | ICD-10-CM | POA: Diagnosis not present

## 2015-08-05 DIAGNOSIS — I25810 Atherosclerosis of coronary artery bypass graft(s) without angina pectoris: Secondary | ICD-10-CM | POA: Diagnosis not present

## 2015-08-05 DIAGNOSIS — I471 Supraventricular tachycardia: Secondary | ICD-10-CM | POA: Diagnosis not present

## 2015-08-05 DIAGNOSIS — I2510 Atherosclerotic heart disease of native coronary artery without angina pectoris: Secondary | ICD-10-CM | POA: Diagnosis not present

## 2015-08-20 DIAGNOSIS — R079 Chest pain, unspecified: Secondary | ICD-10-CM | POA: Diagnosis not present

## 2015-08-20 DIAGNOSIS — R0602 Shortness of breath: Secondary | ICD-10-CM | POA: Diagnosis not present

## 2015-08-27 DIAGNOSIS — E7800 Pure hypercholesterolemia, unspecified: Secondary | ICD-10-CM | POA: Diagnosis not present

## 2015-08-27 DIAGNOSIS — R0602 Shortness of breath: Secondary | ICD-10-CM | POA: Diagnosis not present

## 2015-08-27 DIAGNOSIS — J449 Chronic obstructive pulmonary disease, unspecified: Secondary | ICD-10-CM | POA: Diagnosis not present

## 2015-08-27 DIAGNOSIS — Z951 Presence of aortocoronary bypass graft: Secondary | ICD-10-CM | POA: Diagnosis not present

## 2015-08-27 DIAGNOSIS — I471 Supraventricular tachycardia: Secondary | ICD-10-CM | POA: Diagnosis not present

## 2015-08-27 DIAGNOSIS — I25810 Atherosclerosis of coronary artery bypass graft(s) without angina pectoris: Secondary | ICD-10-CM | POA: Diagnosis not present

## 2015-08-27 DIAGNOSIS — R079 Chest pain, unspecified: Secondary | ICD-10-CM | POA: Diagnosis not present

## 2015-08-27 DIAGNOSIS — I2510 Atherosclerotic heart disease of native coronary artery without angina pectoris: Secondary | ICD-10-CM | POA: Diagnosis not present

## 2015-08-27 DIAGNOSIS — Z9889 Other specified postprocedural states: Secondary | ICD-10-CM | POA: Diagnosis not present

## 2015-09-30 ENCOUNTER — Ambulatory Visit (INDEPENDENT_AMBULATORY_CARE_PROVIDER_SITE_OTHER): Payer: Medicare Other | Admitting: *Deleted

## 2015-09-30 DIAGNOSIS — Z23 Encounter for immunization: Secondary | ICD-10-CM

## 2015-10-07 ENCOUNTER — Ambulatory Visit: Payer: Medicare Other

## 2015-11-21 ENCOUNTER — Ambulatory Visit
Admission: RE | Admit: 2015-11-21 | Discharge: 2015-11-21 | Disposition: A | Discharge status: Home / Self Care | Payer: Medicare Other | Source: Ambulatory Visit | Attending: Internal Medicine | Admitting: Internal Medicine

## 2015-11-21 ENCOUNTER — Encounter: Payer: Self-pay | Admitting: Internal Medicine

## 2015-11-21 ENCOUNTER — Ambulatory Visit (INDEPENDENT_AMBULATORY_CARE_PROVIDER_SITE_OTHER): Payer: Medicare Other | Admitting: Internal Medicine

## 2015-11-21 VITALS — BP 126/74 | HR 71 | Temp 97.6°F | Resp 12 | Ht 66.0 in | Wt 170.6 lb

## 2015-11-21 DIAGNOSIS — G8929 Other chronic pain: Secondary | ICD-10-CM | POA: Insufficient documentation

## 2015-11-21 DIAGNOSIS — M546 Pain in thoracic spine: Secondary | ICD-10-CM | POA: Diagnosis not present

## 2015-11-21 DIAGNOSIS — Z79899 Other long term (current) drug therapy: Secondary | ICD-10-CM | POA: Diagnosis not present

## 2015-11-21 DIAGNOSIS — I2510 Atherosclerotic heart disease of native coronary artery without angina pectoris: Secondary | ICD-10-CM

## 2015-11-21 DIAGNOSIS — M5134 Other intervertebral disc degeneration, thoracic region: Secondary | ICD-10-CM | POA: Diagnosis not present

## 2015-11-21 DIAGNOSIS — E785 Hyperlipidemia, unspecified: Secondary | ICD-10-CM | POA: Diagnosis not present

## 2015-11-21 DIAGNOSIS — E538 Deficiency of other specified B group vitamins: Secondary | ICD-10-CM

## 2015-11-21 DIAGNOSIS — K22719 Barrett's esophagus with dysplasia, unspecified: Secondary | ICD-10-CM

## 2015-11-21 DIAGNOSIS — R748 Abnormal levels of other serum enzymes: Secondary | ICD-10-CM

## 2015-11-21 DIAGNOSIS — E559 Vitamin D deficiency, unspecified: Secondary | ICD-10-CM

## 2015-11-21 LAB — COMPREHENSIVE METABOLIC PANEL
ALBUMIN: 4.1 g/dL (ref 3.5–5.2)
ALK PHOS: 123 U/L — AB (ref 39–117)
ALT: 20 U/L (ref 0–53)
AST: 23 U/L (ref 0–37)
BUN: 12 mg/dL (ref 6–23)
CHLORIDE: 108 meq/L (ref 96–112)
CO2: 27 meq/L (ref 19–32)
CREATININE: 0.98 mg/dL (ref 0.40–1.50)
Calcium: 9.7 mg/dL (ref 8.4–10.5)
GFR: 79.24 mL/min (ref >60.00)
GLUCOSE: 101 mg/dL — AB (ref 70–99)
POTASSIUM: 4.5 meq/L (ref 3.5–5.1)
SODIUM: 140 meq/L (ref 135–145)
Total Bilirubin: 0.5 mg/dL (ref 0.2–1.2)
Total Protein: 6.5 g/dL (ref 6.0–8.3)

## 2015-11-21 LAB — VITAMIN B12: Vitamin B-12: 229 pg/mL (ref 211–911)

## 2015-11-21 LAB — LIPID PANEL
Cholesterol: 148 mg/dL (ref 0–200)
HDL: 51.3 mg/dL (ref >39.00)
LDL Cholesterol: 89 mg/dL (ref 0–99)
NONHDL: 97.03
TRIGLYCERIDES: 38 mg/dL (ref 0.0–149.0)
Total CHOL/HDL Ratio: 3
VLDL: 7.6 mg/dL (ref 0.0–40.0)

## 2015-11-21 LAB — VITAMIN D 25 HYDROXY (VIT D DEFICIENCY, FRACTURES): VITD: 20.85 ng/mL — AB (ref 30.00–100.00)

## 2015-11-21 NOTE — Patient Instructions (Addendum)
Please go to the [place] at [facility] for your x rays.  You do not need an appointment   Continue your   current medications,  Including omeprazole  Daily to prevent Barrett's esophagus from progressing to esophageal cancer   You should take 2000 IUs of Vit D3 daily unless your labs today are abnormal.  i will call you to left you know

## 2015-11-21 NOTE — Progress Notes (Signed)
Pre visit review using our clinic review tool, if applicable. No additional management support is needed unless otherwise documented below in the visit note. 

## 2015-11-21 NOTE — Progress Notes (Signed)
Subjective:  Patient ID: John Burns, male    DOB: 1941/10/04  Age: 74 y.o. MRN: [id number]  CC: The primary encounter diagnosis Burns Chronic midline thoracic back pain. Diagnoses of Long-term use of high-risk medication, Vitamin D deficiency, Hyperlipidemia, Barrett's esophagus with dysplasia, Elevated liver enzymes, B12 deficiency, and Arteriosclerosis of coronary artery were also pertinent to this visit.  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  Continues to have thoracic back pain aggravated by physical activity  , standing .  Some scoliosis noted on exam and prior chest xray, reviewed with patient today    Weight gain of 6 lbs noted.   Appetite good.  Sleeping ok.  No bowel issues, nocturia x 4 some nights,  Sometimes twice Taking omeprazole for a long time    Lab Results  Component Value Date   CHOL 148 11/21/2015   HDL 51.30 11/21/2015   LDLCALC 89 11/21/2015   TRIG 38.0 11/21/2015   CHOLHDL 3 11/21/2015     Outpatient Prescriptions Prior to Visit  Medication Sig Dispense Refill  . aspirin 81 MG tablet Take 81 mg by mouth daily.    Marland Kitchen atorvastatin (LIPITOR) 20 MG tablet Take 1 tablet (20 mg total) by mouth daily at 6 PM. 90 tablet 1  . diltiazem (CARDIZEM CD) 180 MG 24 hr capsule Take 1 capsule (180 mg total) by mouth daily. 90 capsule 1  . metoprolol (LOPRESSOR) 50 MG tablet Take 1 tablet (50 mg total) by mouth daily. 90 tablet 1  . omeprazole (PRILOSEC) 20 MG capsule Take 1 capsule (20 mg total) by mouth daily. 90 capsule 3  . sildenafil (VIAGRA) 100 MG tablet Take 1 tablet (100 mg total) by mouth daily as needed for erectile dysfunction. 10 tablet 3  . vitamin E 400 UNIT capsule Take 400 Units by mouth daily.    . ergocalciferol (DRISDOL) 50000  UNITS capsule Take 1 capsule (50,000 Units total) by mouth once a week. 12 capsule 0   No facility-administered medications prior to visit.    Review of Systems;  Patient denies headache, fevers, malaise, unintentional weight loss, skin rash, eye pain, sinus congestion and sinus pain, sore throat, dysphagia,  hemoptysis , cough, dyspnea, wheezing, chest pain, palpitations, orthopnea, edema, abdominal pain, nausea, melena, diarrhea, constipation, flank pain, dysuria, hematuria, urinary  Frequency, nocturia, numbness, tingling, seizures,  Focal weakness, Loss of consciousness,  Tremor, insomnia, depression, anxiety, and suicidal ideation.      Objective:  BP 126/74 mmHg  Pulse 71  Temp(Src) 97.6 F (36.4 C) (Oral)  Resp 12  Ht 5\' 6"  (1.676 m)  Wt 170 lb 9.6 oz (77.384 kg)  BMI 27.55 kg/m2  SpO2 97%  BP Readings from Last 3 Encounters:  11/21/15 126/74  05/22/15 114/70  11/26/14 138/72    Wt Readings from Last 3 Encounters:  11/21/15 170 lb 9.6 oz (77.384 kg)  05/22/15 164 lb (74.39 kg)  11/26/14 175 lb (79.379 kg)    General appearance: alert, cooperative and appears stated age Ears: normal TM's and external ear canals both ears Throat: lips, mucosa, and tongue normal; teeth and gums normal Neck: no adenopathy, no carotid bruit, supple, symmetrical, trachea midline and thyroid not enlarged, symmetric, no tenderness/mass/nodules Back: kyphotic, scoliotic .  ROM normal. No CVA tenderness. Lungs: clear to auscultation bilaterally Heart: regular rate and rhythm, S1, S2 normal, no murmur, click, rub or gallop Abdomen: soft, non-tender; bowel sounds normal; no masses,  no organomegaly Pulses: 2+ and symmetric Skin: Skin color, texture, turgor normal. No rashes or lesions Lymph nodes: Cervical, supraclavicular, and axillary nodes normal.  No results found for: HGBA1C  Lab Results  Component Value Date   CREATININE 0.98 11/21/2015   CREATININE 1.05 05/22/2015   CREATININE 1.1  11/26/2014    Lab Results  Component Value Date   GLUCOSE 101* 11/21/2015   CHOL 148 11/21/2015   TRIG 38.0 11/21/2015   HDL 51.30 11/21/2015   LDLCALC 89 11/21/2015   ALT 20 11/21/2015   AST 23 11/21/2015   NA 140 11/21/2015   K 4.5 11/21/2015   CL 108 11/21/2015   CREATININE 0.98 11/21/2015   BUN 12 11/21/2015   CO2 27 11/21/2015   TSH 3.47 11/21/2014   PSA 2.33 05/22/2015    US Abdomen Limited Ruq  06/11/2015  CLINICAL DATA:  Elevated alkaline phosphatase level. EXAM: US ABDOMEN LIMITED - RIGHT UPPER QUADRANT COMPARISON:  None. FINDINGS: Gallbladder: No gallstones or wall thickening visualized. No sonographic Murphy sign noted. Common bile duct: Diameter: 3.7 mm Liver: The hepatic echotexture is normal. There is simple appearing cyst in the right hepatic lobe near the dome. It measures 1.7 x 0.9 x 1.7 cm. There is no intrahepatic ductal dilation. IMPRESSION: There is no acute gallbladder or liver abnormality. There is a 1.7 cm maximal dimension right lobe cyst. Electronically Signed   By: David  Martinique M.D.   On: 06/11/2015 08:58    Assessment & Plan:   Problem List Items Addressed This Visit    Arteriosclerosis of coronary artery    Asymptomatic.  continue asa , statin  And beta  Blocker.   Lab Results  Component Value Date   CHOL 148 11/21/2015   HDL 51.30 11/21/2015   LDLCALC 89 11/21/2015   TRIG 38.0 11/21/2015   CHOLHDL 3 11/21/2015         Barrett's esophagus    Discussed current controversy regarding prolonged use of PPI as applying only to  patients without documented Barretts esophagus.   Advised to continue daily use of PPI g iven risk of esophageal CA higher than risk of long term complications         Vitamin D deficiency    Resuming Drisdol for 3 months,  Weekly dosing       Relevant Orders   VITAMIN D 25 Hydroxy (Vit-D Deficiency, Fractures) (Completed)   Elevated liver enzymes    Improving on repeat.  No workup    Lab Results  Component  Value Date   ALT 20 11/21/2015   AST 23 11/21/2015   ALKPHOS 123* 11/21/2015   BILITOT 0.5 11/21/2015         B12 deficiency    Secondary to prolonged ppi use which is necessary injections recommended initially.      Chronic midline thoracic back pain - Primary    Plain films show advanced degenerative changes with disk space loss and spurs. No vertebral fractures.       Relevant Orders   DG Thoracic Spine W/Swimmers (Completed)    Other Visit Diagnoses    Long-term use of high-risk medication        Relevant Orders    Comprehensive metabolic panel (Completed)    Vitamin B12 (Completed)    Hyperlipidemia  Relevant Orders    Lipid panel (Completed)      A total of 40 minutes of face to face time Burns spent with patient more than half of which Burns spent in counselling about the above mentioned conditions  and coordination of care  I have discontinued Mr. Connally ergocalciferol. I am also having him start on ergocalciferol. Additionally, I am having him maintain his aspirin, vitamin E, omeprazole, metoprolol, diltiazem, atorvastatin, and sildenafil.  Meds ordered this encounter  Medications  . ergocalciferol (DRISDOL) 50000 units capsule    Sig: Take 1 capsule (50,000 Units total) by mouth once a week.    Dispense:  12 capsule    Refill:  0    Medications Discontinued During This Encounter  Medication Reason  . ergocalciferol (DRISDOL) 50000 UNITS capsule Completed Course    Follow-up: Return in about 6 months (around 05/21/2016).   Crecencio Mc, MD

## 2015-11-22 ENCOUNTER — Encounter: Payer: Self-pay | Admitting: Internal Medicine

## 2015-11-22 DIAGNOSIS — E538 Deficiency of other specified B group vitamins: Secondary | ICD-10-CM | POA: Insufficient documentation

## 2015-11-22 DIAGNOSIS — G8929 Other chronic pain: Secondary | ICD-10-CM | POA: Insufficient documentation

## 2015-11-22 DIAGNOSIS — M546 Pain in thoracic spine: Principal | ICD-10-CM

## 2015-11-22 MED ORDER — ERGOCALCIFEROL 1.25 MG (50000 UT) PO CAPS: 50000.0000 [IU] | ORAL_CAPSULE | ORAL | Status: DC

## 2015-11-22 NOTE — Assessment & Plan Note (Signed)
Secondary to prolonged ppi use which is necessary injections recommended initially.

## 2015-11-22 NOTE — Assessment & Plan Note (Addendum)
Discussed current controversy regarding prolonged use of PPI as applying only to  patients without documented Barretts esophagus.   Advised to continue daily use of PPI g iven risk of esophageal CA higher than risk of long term complications    

## 2015-11-22 NOTE — Assessment & Plan Note (Signed)
Plain films show advanced degenerative changes with disk space loss and spurs. No vertebral fractures.

## 2015-11-22 NOTE — Assessment & Plan Note (Addendum)
Asymptomatic.  continue asa , statin  And beta  Blocker.   Lab Results  Component Value Date   CHOL 148 11/21/2015   HDL 51.30 11/21/2015   LDLCALC 89 11/21/2015   TRIG 38.0 11/21/2015   CHOLHDL 3 11/21/2015

## 2015-11-22 NOTE — Assessment & Plan Note (Signed)
Resuming Drisdol for 3 months,  Weekly dosing

## 2015-11-22 NOTE — Assessment & Plan Note (Signed)
Improving on repeat.  No workup    Lab Results  Component Value Date   ALT 20 11/21/2015   AST 23 11/21/2015   ALKPHOS 123* 11/21/2015   BILITOT 0.5 11/21/2015

## 2015-11-26 ENCOUNTER — Other Ambulatory Visit: Payer: Self-pay | Admitting: Internal Medicine

## 2015-11-27 ENCOUNTER — Other Ambulatory Visit: Payer: Self-pay

## 2015-11-27 ENCOUNTER — Ambulatory Visit (INDEPENDENT_AMBULATORY_CARE_PROVIDER_SITE_OTHER): Payer: Medicare Other

## 2015-11-27 DIAGNOSIS — E538 Deficiency of other specified B group vitamins: Secondary | ICD-10-CM

## 2015-11-27 MED ORDER — ATORVASTATIN CALCIUM 20 MG PO TABS: 20.0000 mg | ORAL_TABLET | Freq: Every day | ORAL | Status: DC

## 2015-11-27 MED ORDER — CYANOCOBALAMIN 1000 MCG/ML IJ SOLN
1000.0000 ug | Freq: Once | INTRAMUSCULAR | Status: AC
  Administered 2015-11-27: 1000 ug via INTRAMUSCULAR

## 2015-11-27 MED ORDER — ASPIRIN 81 MG PO TABS: 81.0000 mg | ORAL_TABLET | Freq: Every day | ORAL | Status: AC

## 2015-11-27 MED ORDER — METOPROLOL TARTRATE 50 MG PO TABS: 50.0000 mg | ORAL_TABLET | Freq: Every day | ORAL | Status: DC

## 2015-11-27 MED ORDER — OMEPRAZOLE 20 MG PO CPDR
20.0000 mg | DELAYED_RELEASE_CAPSULE | Freq: Every day | ORAL | Status: DC
Start: 2015-11-27 — End: 2016-11-23

## 2015-11-27 NOTE — Progress Notes (Signed)
Patient came in for 1st B12 injection.  Received in Left deltoid.  Patient tolerated well.  Scheduled next visit for next Thursday at 10am.

## 2015-12-04 ENCOUNTER — Ambulatory Visit (INDEPENDENT_AMBULATORY_CARE_PROVIDER_SITE_OTHER): Payer: Medicare Other

## 2015-12-04 DIAGNOSIS — E538 Deficiency of other specified B group vitamins: Secondary | ICD-10-CM | POA: Diagnosis not present

## 2015-12-04 MED ORDER — CYANOCOBALAMIN 1000 MCG/ML IJ SOLN
1000.0000 ug | Freq: Once | INTRAMUSCULAR | Status: AC
  Administered 2015-12-04: 1000 ug via INTRAMUSCULAR

## 2015-12-11 ENCOUNTER — Ambulatory Visit (INDEPENDENT_AMBULATORY_CARE_PROVIDER_SITE_OTHER): Payer: Medicare Other

## 2015-12-11 DIAGNOSIS — E538 Deficiency of other specified B group vitamins: Secondary | ICD-10-CM | POA: Diagnosis not present

## 2015-12-11 MED ORDER — CYANOCOBALAMIN 1000 MCG/ML IJ SOLN
1000.0000 ug | Freq: Once | INTRAMUSCULAR | Status: AC
  Administered 2015-12-11: 1000 ug via INTRAMUSCULAR

## 2015-12-11 NOTE — Progress Notes (Signed)
Patient came in for B12 injection.  Received in Left Deltoid.  Patient tolerated well.

## 2015-12-18 DIAGNOSIS — H2513 Age-related nuclear cataract, bilateral: Secondary | ICD-10-CM | POA: Diagnosis not present

## 2015-12-19 DIAGNOSIS — H2513 Age-related nuclear cataract, bilateral: Secondary | ICD-10-CM | POA: Diagnosis not present

## 2015-12-24 NOTE — H&P (Signed)
  See scanned notes. 

## 2015-12-25 ENCOUNTER — Encounter: Payer: Self-pay | Admitting: *Deleted

## 2015-12-28 NOTE — H&P (Signed)
See scanned note.

## 2015-12-29 ENCOUNTER — Ambulatory Visit: Payer: Medicare Other | Admitting: Anesthesiology

## 2015-12-29 ENCOUNTER — Encounter: Payer: Self-pay | Admitting: *Deleted

## 2015-12-29 ENCOUNTER — Encounter
Admission: RE | Disposition: A | Discharge status: Home / Self Care | Payer: Self-pay | Source: Ambulatory Visit | Attending: Ophthalmology

## 2015-12-29 ENCOUNTER — Ambulatory Visit
Admission: RE | Admit: 2015-12-29 | Discharge: 2015-12-29 | Disposition: A | Discharge status: Home / Self Care | Payer: Medicare Other | Source: Ambulatory Visit | Attending: Ophthalmology | Admitting: Ophthalmology

## 2015-12-29 DIAGNOSIS — Z87891 Personal history of nicotine dependence: Secondary | ICD-10-CM | POA: Insufficient documentation

## 2015-12-29 DIAGNOSIS — H9190 Unspecified hearing loss, unspecified ear: Secondary | ICD-10-CM | POA: Insufficient documentation

## 2015-12-29 DIAGNOSIS — R0602 Shortness of breath: Secondary | ICD-10-CM | POA: Diagnosis not present

## 2015-12-29 DIAGNOSIS — I252 Old myocardial infarction: Secondary | ICD-10-CM | POA: Insufficient documentation

## 2015-12-29 DIAGNOSIS — Z955 Presence of coronary angioplasty implant and graft: Secondary | ICD-10-CM | POA: Insufficient documentation

## 2015-12-29 DIAGNOSIS — I10 Essential (primary) hypertension: Secondary | ICD-10-CM | POA: Insufficient documentation

## 2015-12-29 DIAGNOSIS — K449 Diaphragmatic hernia without obstruction or gangrene: Secondary | ICD-10-CM | POA: Insufficient documentation

## 2015-12-29 DIAGNOSIS — Z951 Presence of aortocoronary bypass graft: Secondary | ICD-10-CM | POA: Diagnosis not present

## 2015-12-29 DIAGNOSIS — K219 Gastro-esophageal reflux disease without esophagitis: Secondary | ICD-10-CM | POA: Diagnosis not present

## 2015-12-29 DIAGNOSIS — I471 Supraventricular tachycardia: Secondary | ICD-10-CM | POA: Diagnosis not present

## 2015-12-29 DIAGNOSIS — J449 Chronic obstructive pulmonary disease, unspecified: Secondary | ICD-10-CM | POA: Insufficient documentation

## 2015-12-29 DIAGNOSIS — E7800 Pure hypercholesterolemia, unspecified: Secondary | ICD-10-CM | POA: Insufficient documentation

## 2015-12-29 DIAGNOSIS — H2512 Age-related nuclear cataract, left eye: Secondary | ICD-10-CM | POA: Diagnosis not present

## 2015-12-29 DIAGNOSIS — H2513 Age-related nuclear cataract, bilateral: Secondary | ICD-10-CM | POA: Diagnosis not present

## 2015-12-29 DIAGNOSIS — I2510 Atherosclerotic heart disease of native coronary artery without angina pectoris: Secondary | ICD-10-CM | POA: Diagnosis not present

## 2015-12-29 HISTORY — DX: Reserved for inherently not codable concepts without codable children: IMO0001

## 2015-12-29 HISTORY — DX: Unspecified hearing loss, unspecified ear: H91.90

## 2015-12-29 HISTORY — PX: CATARACT EXTRACTION W/PHACO: SHX586

## 2015-12-29 HISTORY — DX: Personal history of other diseases of the digestive system: Z87.19

## 2015-12-29 SURGERY — PHACOEMULSIFICATION, CATARACT, WITH IOL INSERTION
Anesthesia: Monitor Anesthesia Care | Site: Eye | Laterality: Left | Wound class: Clean

## 2015-12-29 MED ORDER — PHENYLEPHRINE HCL 10 % OP SOLN
1.0000 [drp] | OPHTHALMIC | Status: AC | PRN
  Administered 2015-12-29: 1 [drp] via OPHTHALMIC

## 2015-12-29 MED ORDER — LIDOCAINE HCL (PF) 4 % IJ SOLN
INTRAOCULAR | Status: DC | PRN
  Administered 2015-12-29: 4 mL via OPHTHALMIC

## 2015-12-29 MED ORDER — HYALURONIDASE HUMAN 150 UNIT/ML IJ SOLN
INTRAMUSCULAR | Status: AC
  Filled 2015-12-29: qty 1

## 2015-12-29 MED ORDER — ALFENTANIL 500 MCG/ML IJ INJ
INJECTION | INTRAMUSCULAR | Status: DC | PRN
  Administered 2015-12-29: 500 ug via INTRAVENOUS

## 2015-12-29 MED ORDER — LIDOCAINE HCL (PF) 4 % IJ SOLN
INTRAMUSCULAR | Status: AC
  Filled 2015-12-29: qty 5

## 2015-12-29 MED ORDER — NA CHONDROIT SULF-NA HYALURON 40-17 MG/ML IO SOLN
INTRAOCULAR | Status: DC | PRN
  Administered 2015-12-29: 1 mL via INTRAOCULAR

## 2015-12-29 MED ORDER — SODIUM CHLORIDE 0.9 % IV SOLN
INTRAVENOUS | Status: DC
  Administered 2015-12-29: 09:00:00 via INTRAVENOUS

## 2015-12-29 MED ORDER — CARBACHOL 0.01 % IO SOLN
INTRAOCULAR | Status: DC | PRN
  Administered 2015-12-29: 0.5 mL via INTRAOCULAR

## 2015-12-29 MED ORDER — LIDOCAINE HCL (PF) 4 % IJ SOLN
INTRAMUSCULAR | Status: DC | PRN
  Administered 2015-12-29: 4 mL via OPHTHALMIC

## 2015-12-29 MED ORDER — CYCLOPENTOLATE HCL 2 % OP SOLN
1.0000 [drp] | OPHTHALMIC | Status: DC | PRN
  Administered 2015-12-29: 1 [drp] via OPHTHALMIC

## 2015-12-29 MED ORDER — CEFUROXIME OPHTHALMIC INJECTION 1 MG/0.1 ML
INJECTION | OPHTHALMIC | Status: DC | PRN
  Administered 2015-12-29: 1 mg via INTRACAMERAL

## 2015-12-29 MED ORDER — MOXIFLOXACIN HCL 0.5 % OP SOLN
OPHTHALMIC | Status: AC
  Administered 2015-12-29: 1 [drp] via OPHTHALMIC
  Filled 2015-12-29: qty 3

## 2015-12-29 MED ORDER — MOXIFLOXACIN HCL 0.5 % OP SOLN
1.0000 [drp] | OPHTHALMIC | Status: DC | PRN
  Administered 2015-12-29: 1 [drp] via OPHTHALMIC

## 2015-12-29 MED ORDER — MOXIFLOXACIN HCL 0.5 % OP SOLN
OPHTHALMIC | Status: DC | PRN
  Administered 2015-12-29: 4 [drp] via OPHTHALMIC

## 2015-12-29 MED ORDER — EPINEPHRINE HCL 1 MG/ML IJ SOLN
INTRAMUSCULAR | Status: AC
  Filled 2015-12-29: qty 2

## 2015-12-29 MED ORDER — TETRACAINE HCL 0.5 % OP SOLN
OPHTHALMIC | Status: AC
  Filled 2015-12-29: qty 2

## 2015-12-29 MED ORDER — MIDAZOLAM HCL 2 MG/2ML IJ SOLN
INTRAMUSCULAR | Status: DC | PRN
  Administered 2015-12-29: .5 mg via INTRAVENOUS

## 2015-12-29 MED ORDER — PHENYLEPHRINE HCL 10 % OP SOLN
OPHTHALMIC | Status: AC
  Administered 2015-12-29: 1 [drp] via OPHTHALMIC
  Filled 2015-12-29: qty 5

## 2015-12-29 MED ORDER — NA CHONDROIT SULF-NA HYALURON 40-17 MG/ML IO SOLN
INTRAOCULAR | Status: AC
  Filled 2015-12-29: qty 1

## 2015-12-29 MED ORDER — CYCLOPENTOLATE HCL 2 % OP SOLN
OPHTHALMIC | Status: AC
  Administered 2015-12-29: 1 [drp] via OPHTHALMIC
  Filled 2015-12-29: qty 2

## 2015-12-29 MED ORDER — CEFUROXIME OPHTHALMIC INJECTION 1 MG/0.1 ML
INJECTION | OPHTHALMIC | Status: AC
  Filled 2015-12-29: qty 0.1

## 2015-12-29 MED ORDER — EPINEPHRINE HCL 1 MG/ML IJ SOLN
INTRAMUSCULAR | Status: DC | PRN
  Administered 2015-12-29: 250 mL via OPHTHALMIC

## 2015-12-29 MED ORDER — TETRACAINE HCL 0.5 % OP SOLN
OPHTHALMIC | Status: DC | PRN
  Administered 2015-12-29: 2 [drp] via OPHTHALMIC

## 2015-12-29 MED ORDER — BUPIVACAINE HCL (PF) 0.75 % IJ SOLN
INTRAMUSCULAR | Status: AC
  Filled 2015-12-29: qty 10

## 2015-12-29 SURGICAL SUPPLY — 30 items
CANNULA ANT/CHMB 27GA (MISCELLANEOUS) ×3 IMPLANT
CORD BIP STRL DISP 12FT (MISCELLANEOUS) ×3 IMPLANT
CUP MEDICINE 2OZ PLAST GRAD ST (MISCELLANEOUS) ×3 IMPLANT
DRAPE XRAY CASSETTE 23X24 (DRAPES) ×3 IMPLANT
ERASER HMR WETFIELD 18G (MISCELLANEOUS) ×3 IMPLANT
GLOVE BIO SURGEON STRL SZ8 (GLOVE) ×3 IMPLANT
GLOVE SURG LX 6.5 MICRO (GLOVE) ×2
GLOVE SURG LX 8.0 MICRO (GLOVE) ×2
GLOVE SURG LX STRL 6.5 MICRO (GLOVE) ×1 IMPLANT
GLOVE SURG LX STRL 8.0 MICRO (GLOVE) ×1 IMPLANT
GOWN STRL REUS W/ TWL LRG LVL3 (GOWN DISPOSABLE) ×1 IMPLANT
GOWN STRL REUS W/ TWL XL LVL3 (GOWN DISPOSABLE) ×1 IMPLANT
GOWN STRL REUS W/TWL LRG LVL3 (GOWN DISPOSABLE) ×2
GOWN STRL REUS W/TWL XL LVL3 (GOWN DISPOSABLE) ×2
LENS IOL ACRSF IQ ULTRA 23.0 (Intraocular Lens) ×1 IMPLANT
LENS IOL ACRYSOF IQ 23.0 (Intraocular Lens) ×3 IMPLANT
PACK CATARACT (MISCELLANEOUS) ×3 IMPLANT
PACK CATARACT DINGLEDEIN LX (MISCELLANEOUS) ×3 IMPLANT
PACK EYE AFTER SURG (MISCELLANEOUS) ×3 IMPLANT
SHLD EYE VISITEC  UNIV (MISCELLANEOUS) ×3 IMPLANT
SOL BSS BAG (MISCELLANEOUS) ×3
SOL PREP PVP 2OZ (MISCELLANEOUS) ×3
SOLUTION BSS BAG (MISCELLANEOUS) ×1 IMPLANT
SOLUTION PREP PVP 2OZ (MISCELLANEOUS) ×1 IMPLANT
SUT SILK 5-0 (SUTURE) ×3 IMPLANT
SYR 3ML LL SCALE MARK (SYRINGE) ×3 IMPLANT
SYR 5ML LL (SYRINGE) ×3 IMPLANT
SYR TB 1ML 27GX1/2 LL (SYRINGE) ×3 IMPLANT
WATER STERILE IRR 1000ML POUR (IV SOLUTION) ×3 IMPLANT
WIPE NON LINTING 3.25X3.25 (MISCELLANEOUS) ×3 IMPLANT

## 2015-12-29 NOTE — Anesthesia Postprocedure Evaluation (Signed)
Anesthesia Post Note  Patient: John Burns  Procedure(s) Performed: Procedure(s) (LRB): CATARACT EXTRACTION PHACO AND INTRAOCULAR LENS PLACEMENT (IOC) (Left)  Patient location during evaluation: Short Stay Anesthesia Type: MAC Level of consciousness: awake Pain management: pain level controlled Vital Signs Assessment: post-procedure vital signs reviewed and stable Respiratory status: spontaneous breathing Cardiovascular status: blood pressure returned to baseline Postop Assessment: no headache Anesthetic complications: no    Last Vitals:  Filed Vitals:   12/29/15 0844 12/29/15 1045  BP: 155/64 141/65  Pulse: 56 55  Temp: 36.6 C   Resp: 16 16    Last Pain: There were no vitals filed for this visit.               Buckner Malta

## 2015-12-29 NOTE — Discharge Instructions (Signed)
AMBULATORY SURGERY  DISCHARGE INSTRUCTIONS   1) The drugs that you were given will stay in your system until tomorrow so for the next 24 hours you should not:  A) Drive an automobile B) Make any legal decisions C) Drink any alcoholic beverage   2) You may resume regular meals tomorrow.  Today it is better to start with liquids and gradually work up to solid foods.  You may eat anything you prefer, but it is better to start with liquids, then soup and crackers, and gradually work up to solid foods.   3) Please notify your doctor immediately if you have any unusual bleeding, trouble breathing, redness and pain at the surgery site, drainage, fever, or pain not relieved by medication.    4) Additional Instructions:     Eye Surgery Discharge Instructions  Expect mild scratchy sensation or mild soreness. DO NOT RUB YOUR EYE!  The day of surgery:  Minimal physical activity, but bed rest is not required  No reading, computer work, or close hand work  No bending, lifting, or straining.  May watch TV  For 24 hours:  No driving, legal decisions, or alcoholic beverages  Safety precautions  Eat anything you prefer: It is better to start with liquids, then soup then solid foods.  _____ Eye patch should be worn until postoperative exam tomorrow.  ____ Solar shield eyeglasses should be worn for comfort in the sunlight/patch while sleeping  Resume all regular medications including aspirin or Coumadin if these were discontinued prior to surgery. You may shower, bathe, shave, or wash your hair. Tylenol may be taken for mild discomfort.  Call your doctor if you experience significant pain, nausea, or vomiting, fever > 101 or other signs of infection. [phone] or [phone] Specific instructions:  Follow-up Information    Follow up with Kady Toothaker, MD In 1 day.   Specialty:  Ophthalmology   Why:  February 7 at 10:05am   Contact information:   [address]   [place] [address] [phone]        Please contact your physician with any problems or Same Day Surgery at [phone], Monday through Friday 6 am to 4 pm, or [place] at [facility] number at [phone].

## 2015-12-29 NOTE — Anesthesia Procedure Notes (Signed)
Procedure Name: MAC Date/Time: 12/29/2015 10:10 AM Performed by: Allean Found Pre-anesthesia Checklist: Patient identified, Emergency Drugs available, Suction available, Patient being monitored and Timeout performed Patient Re-evaluated:Patient Re-evaluated prior to inductionOxygen Delivery Method: Nasal cannula Comments: Spontaneous resp throughout

## 2015-12-29 NOTE — Op Note (Signed)
Date of Surgery: 12/29/2015 Date of Dictation: 12/29/2015 10:44 AM Pre-operative Diagnosis:  Nuclear Sclerotic Cataract left Eye Post-operative Diagnosis: same Procedure performed: Extra-capsular Cataract Extraction (ECCE) with placement of a posterior chamber intraocular lens (IOL) left Eye IOL:  Implant Name Type Inv. Item Serial No. Manufacturer Lot No. LRB No. Used  LENS IOL ACRYSOF IQ 23.0 - [id number] Intraocular Lens LENS IOL ACRYSOF IQ 23.0 [id number] ALCON   Left 1   Anesthesia: 2% Lidocaine and 4% Marcaine in a 50/50 mixture with 10 unites/ml of Hylenex given as a peribulbar Anesthesiologist: Anesthesiologist: Gunnar Bulla, MD CRNA: Allean Found, CRNA Complications: none Estimated Blood Loss: less than 1 ml  Description of procedure:  The patient was given anesthesia and sedation via intravenous access. The patient was then prepped and draped in the usual fashion. A 25-gauge needle was bent for initiating the capsulorhexis. A 5-0 silk suture was placed through the conjunctiva superior and inferiorly to serve as bridle sutures. Hemostasis was obtained at the superior limbus using an eraser cautery. A partial thickness groove was made at the anterior surgical limbus with a 64 Beaver blade and this was dissected anteriorly with an Avaya. The anterior chamber was entered at 10 o'clock with a 1.0 mm paracentesis knife and through the lamellar dissection with a 2.6 mm Alcon keratome. Epi-Shugarcaine 0.5 CC [9 cc BSS Plus (Alcon), 3 cc 4% preservative-free lidocaine (Hospira) and 4 cc 1:1000 preservative-free, bisulfite-free epinephrine] was injected into the anterior chamber via the paracentesis tract. Epi-Shugarcaine 0.5 CC [9 cc BSS Plus (Alcon), 3 cc 4% preservative-free lidocaine (Hospira) and 4 cc 1:1000 preservative-free, bisulfite-free epinephrine] was injected into the anterior chamber via the paracentesis tract. DiscoVisc was injected to replace the aqueous and a  continuous tear curvilinear capsulorhexis was performed using a bent 25-gauge needle.  Balance salt on a syringe was used to perform hydro-dissection and phacoemulsification was carried out using a divide and conquer technique. Procedure(s) with comments: CATARACT EXTRACTION PHACO AND INTRAOCULAR LENS PLACEMENT (IOC) (Left) - [place]: 01:53.8 AP%: 26.2 CDE: 49.38 Lot # [id number]. Irrigation/aspiration was used to remove the residual cortex and the capsular bag was inflated with DiscoVisc. The intraocular lens was inserted into the capsular bag using a pre-loaded UltraSert Delivery System. Irrigation/aspiration was used to remove the residual DiscoVisc. The wound was inflated with balanced salt and checked for leaks. None were found. Miostat was injected via the paracentesis track and 0.1 ml of cefuroxime containing 1 mg of drug  was injected via the paracentesis track. The wound was checked for leaks again and none were found.   The bridal sutures were removed and two drops of Vigamox were placed on the eye. An eye shield was placed to protect the eye and the patient was discharged to the recovery area in good condition.   Sydne Krahl MD

## 2015-12-29 NOTE — Interval H&P Note (Signed)
History and Physical Interval Note:  12/29/2015 9:57 AM  John Burns  has presented today for surgery, with the diagnosis of CATARACT  The various methods of treatment have been discussed with the patient and family. After consideration of risks, benefits and other options for treatment, the patient has consented to  Procedure(s): CATARACT EXTRACTION PHACO AND INTRAOCULAR LENS PLACEMENT ([place]) (Left) as a surgical intervention .  The patient's history has been reviewed, patient examined, no change in status, stable for surgery.  I have reviewed the patient's chart and labs.  Questions were answered to the patient's satisfaction.     Krishna Heuer

## 2015-12-29 NOTE — Transfer of Care (Signed)
Immediate Anesthesia Transfer of Care Note  Patient: FRANKLYN BARSH  Procedure(s) Performed: Procedure(s) with comments: CATARACT EXTRACTION PHACO AND INTRAOCULAR LENS PLACEMENT (IOC) (Left) - [place]: 01:53.8 AP%: 26.2 CDE: 49.38 Lot # [id number]  Patient Location: PACU and Short Stay  Anesthesia Type:MAC  Level of Consciousness: sedated  Airway & Oxygen Therapy: Patient Spontanous Breathing and Patient connected to nasal cannula oxygen  Post-op Assessment: Report given to RN and Post -op Vital signs reviewed and stable  Post vital signs: Reviewed and stable  Last Vitals:  Filed Vitals:   12/29/15 0844  BP: 155/64  Pulse: 56  Temp: 36.6 C  Resp: 16    Complications: No apparent anesthesia complications

## 2015-12-29 NOTE — Anesthesia Preprocedure Evaluation (Signed)
Anesthesia Evaluation  Patient identified by MRN, date of birth, ID band Patient awake    Reviewed: Allergy & Precautions, NPO status , Patient's Chart, lab work & pertinent test results, reviewed documented beta blocker date and time   Airway Mallampati: II  TM Distance: >3 FB     Dental  (+) Chipped   Pulmonary shortness of breath, COPD, former smoker,           Cardiovascular + CAD and + Past MI       Neuro/Psych    GI/Hepatic hiatal hernia, GERD  Controlled,  Endo/Other    Renal/GU      Musculoskeletal   Abdominal   Peds  Hematology   Anesthesia Other Findings CABG. Hx of SVT. MI. Stent.  Reproductive/Obstetrics                             Anesthesia Physical Anesthesia Plan  ASA: III  Anesthesia Plan: MAC   Post-op Pain Management:    Induction:   Airway Management Planned:   Additional Equipment:   Intra-op Plan:   Post-operative Plan:   Informed Consent: I have reviewed the patients History and Physical, chart, labs and discussed the procedure including the risks, benefits and alternatives for the proposed anesthesia with the patient or authorized representative who has indicated his/her understanding and acceptance.     Plan Discussed with: CRNA  Anesthesia Plan Comments:         Anesthesia Quick Evaluation

## 2015-12-30 DIAGNOSIS — E7800 Pure hypercholesterolemia, unspecified: Secondary | ICD-10-CM | POA: Diagnosis not present

## 2015-12-30 DIAGNOSIS — I200 Unstable angina: Secondary | ICD-10-CM | POA: Diagnosis not present

## 2015-12-30 DIAGNOSIS — Z951 Presence of aortocoronary bypass graft: Secondary | ICD-10-CM | POA: Diagnosis not present

## 2015-12-30 DIAGNOSIS — I471 Supraventricular tachycardia: Secondary | ICD-10-CM | POA: Diagnosis not present

## 2015-12-30 DIAGNOSIS — Z9889 Other specified postprocedural states: Secondary | ICD-10-CM | POA: Diagnosis not present

## 2015-12-30 DIAGNOSIS — I2510 Atherosclerotic heart disease of native coronary artery without angina pectoris: Secondary | ICD-10-CM | POA: Diagnosis not present

## 2015-12-30 DIAGNOSIS — R079 Chest pain, unspecified: Secondary | ICD-10-CM | POA: Diagnosis not present

## 2015-12-30 DIAGNOSIS — J410 Simple chronic bronchitis: Secondary | ICD-10-CM | POA: Diagnosis not present

## 2015-12-30 DIAGNOSIS — R0602 Shortness of breath: Secondary | ICD-10-CM | POA: Diagnosis not present

## 2016-01-13 ENCOUNTER — Ambulatory Visit: Payer: Medicare Other

## 2016-01-14 ENCOUNTER — Ambulatory Visit (INDEPENDENT_AMBULATORY_CARE_PROVIDER_SITE_OTHER): Payer: Medicare Other

## 2016-01-14 DIAGNOSIS — E538 Deficiency of other specified B group vitamins: Secondary | ICD-10-CM | POA: Diagnosis not present

## 2016-01-14 MED ORDER — CYANOCOBALAMIN 1000 MCG/ML IJ SOLN
1000.0000 ug | Freq: Once | INTRAMUSCULAR | Status: AC
  Administered 2016-01-14: 1000 ug via INTRAMUSCULAR

## 2016-01-14 NOTE — Progress Notes (Signed)
Patient came in for b12 injection.  Received in Right deltoid.  Patient tolerated well.  

## 2016-01-22 DIAGNOSIS — H2511 Age-related nuclear cataract, right eye: Secondary | ICD-10-CM | POA: Diagnosis not present

## 2016-01-23 ENCOUNTER — Encounter: Payer: Self-pay | Admitting: *Deleted

## 2016-01-25 NOTE — H&P (Signed)
See scanned note.

## 2016-01-26 ENCOUNTER — Encounter: Payer: Self-pay | Admitting: *Deleted

## 2016-01-26 ENCOUNTER — Ambulatory Visit: Payer: Medicare Other | Admitting: *Deleted

## 2016-01-26 ENCOUNTER — Encounter
Admission: RE | Disposition: A | Discharge status: Home / Self Care | Payer: Self-pay | Source: Ambulatory Visit | Attending: Ophthalmology

## 2016-01-26 ENCOUNTER — Ambulatory Visit
Admission: RE | Admit: 2016-01-26 | Discharge: 2016-01-26 | Disposition: A | Discharge status: Home / Self Care | Payer: Medicare Other | Source: Ambulatory Visit | Attending: Ophthalmology | Admitting: Ophthalmology

## 2016-01-26 DIAGNOSIS — J449 Chronic obstructive pulmonary disease, unspecified: Secondary | ICD-10-CM | POA: Diagnosis not present

## 2016-01-26 DIAGNOSIS — Z951 Presence of aortocoronary bypass graft: Secondary | ICD-10-CM | POA: Insufficient documentation

## 2016-01-26 DIAGNOSIS — Z955 Presence of coronary angioplasty implant and graft: Secondary | ICD-10-CM | POA: Insufficient documentation

## 2016-01-26 DIAGNOSIS — K219 Gastro-esophageal reflux disease without esophagitis: Secondary | ICD-10-CM | POA: Insufficient documentation

## 2016-01-26 DIAGNOSIS — Z9842 Cataract extraction status, left eye: Secondary | ICD-10-CM | POA: Diagnosis not present

## 2016-01-26 DIAGNOSIS — I25810 Atherosclerosis of coronary artery bypass graft(s) without angina pectoris: Secondary | ICD-10-CM | POA: Diagnosis not present

## 2016-01-26 DIAGNOSIS — H2511 Age-related nuclear cataract, right eye: Secondary | ICD-10-CM | POA: Diagnosis not present

## 2016-01-26 DIAGNOSIS — Z87891 Personal history of nicotine dependence: Secondary | ICD-10-CM | POA: Insufficient documentation

## 2016-01-26 DIAGNOSIS — E7800 Pure hypercholesterolemia, unspecified: Secondary | ICD-10-CM | POA: Diagnosis not present

## 2016-01-26 DIAGNOSIS — I10 Essential (primary) hypertension: Secondary | ICD-10-CM | POA: Insufficient documentation

## 2016-01-26 DIAGNOSIS — K449 Diaphragmatic hernia without obstruction or gangrene: Secondary | ICD-10-CM | POA: Diagnosis not present

## 2016-01-26 DIAGNOSIS — H9190 Unspecified hearing loss, unspecified ear: Secondary | ICD-10-CM | POA: Diagnosis not present

## 2016-01-26 HISTORY — DX: Essential (primary) hypertension: I10

## 2016-01-26 HISTORY — PX: CATARACT EXTRACTION W/PHACO: SHX586

## 2016-01-26 SURGERY — PHACOEMULSIFICATION, CATARACT, WITH IOL INSERTION
Anesthesia: Monitor Anesthesia Care | Site: Eye | Laterality: Right | Wound class: Clean

## 2016-01-26 MED ORDER — NA CHONDROIT SULF-NA HYALURON 40-17 MG/ML IO SOLN
INTRAOCULAR | Status: AC
  Filled 2016-01-26: qty 1

## 2016-01-26 MED ORDER — BUPIVACAINE HCL (PF) 0.75 % IJ SOLN
INTRAMUSCULAR | Status: AC
  Filled 2016-01-26: qty 10

## 2016-01-26 MED ORDER — NA CHONDROIT SULF-NA HYALURON 40-17 MG/ML IO SOLN
INTRAOCULAR | Status: DC | PRN
  Administered 2016-01-26: 1 mL via INTRAOCULAR

## 2016-01-26 MED ORDER — LIDOCAINE HCL (PF) 4 % IJ SOLN
INTRAMUSCULAR | Status: DC | PRN
  Administered 2016-01-26: 4 mL via OPHTHALMIC

## 2016-01-26 MED ORDER — CEFUROXIME OPHTHALMIC INJECTION 1 MG/0.1 ML
INJECTION | OPHTHALMIC | Status: AC
  Filled 2016-01-26: qty 0.1

## 2016-01-26 MED ORDER — EPINEPHRINE HCL 1 MG/ML IJ SOLN
INTRAMUSCULAR | Status: AC
  Filled 2016-01-26: qty 2

## 2016-01-26 MED ORDER — SODIUM CHLORIDE 0.9 % IV SOLN
INTRAVENOUS | Status: DC
Start: 2016-01-26 — End: 2016-01-26
  Administered 2016-01-26 (×2): via INTRAVENOUS

## 2016-01-26 MED ORDER — MOXIFLOXACIN HCL 0.5 % OP SOLN: 1.0000 [drp] | OPHTHALMIC | Status: AC

## 2016-01-26 MED ORDER — LIDOCAINE HCL (PF) 4 % IJ SOLN
INTRAOCULAR | Status: DC | PRN
  Administered 2016-01-26: .5 mL via OPHTHALMIC

## 2016-01-26 MED ORDER — MOXIFLOXACIN HCL 0.5 % OP SOLN
OPHTHALMIC | Status: DC | PRN
  Administered 2016-01-26: 1 [drp] via OPHTHALMIC

## 2016-01-26 MED ORDER — HYALURONIDASE HUMAN 150 UNIT/ML IJ SOLN
INTRAMUSCULAR | Status: AC
  Filled 2016-01-26: qty 1

## 2016-01-26 MED ORDER — PHENYLEPHRINE HCL 10 % OP SOLN
OPHTHALMIC | Status: AC
  Administered 2016-01-26: 1 [drp] via OPHTHALMIC
  Filled 2016-01-26: qty 5

## 2016-01-26 MED ORDER — CYCLOPENTOLATE HCL 2 % OP SOLN
1.0000 [drp] | OPHTHALMIC | Status: DC
  Administered 2016-01-26 (×2): 1 [drp] via OPHTHALMIC

## 2016-01-26 MED ORDER — ALFENTANIL 500 MCG/ML IJ INJ
INJECTION | INTRAMUSCULAR | Status: DC | PRN
  Administered 2016-01-26: 750 ug via INTRAVENOUS

## 2016-01-26 MED ORDER — EPINEPHRINE HCL 1 MG/ML IJ SOLN
INTRAMUSCULAR | Status: DC | PRN
  Administered 2016-01-26: 1 mL via OPHTHALMIC

## 2016-01-26 MED ORDER — PHENYLEPHRINE HCL 10 % OP SOLN
1.0000 [drp] | OPHTHALMIC | Status: AC
  Administered 2016-01-26: 1 [drp] via OPHTHALMIC

## 2016-01-26 MED ORDER — POVIDONE-IODINE 5 % OP SOLN
OPHTHALMIC | Status: DC | PRN
  Administered 2016-01-26: 1 via OPHTHALMIC

## 2016-01-26 MED ORDER — CEFUROXIME OPHTHALMIC INJECTION 1 MG/0.1 ML
INJECTION | OPHTHALMIC | Status: DC | PRN
  Administered 2016-01-26: .1 mL via INTRACAMERAL

## 2016-01-26 MED ORDER — CARBACHOL 0.01 % IO SOLN
INTRAOCULAR | Status: DC | PRN
  Administered 2016-01-26: .5 mL via INTRAOCULAR

## 2016-01-26 MED ORDER — POVIDONE-IODINE 5 % OP SOLN
OPHTHALMIC | Status: AC
  Filled 2016-01-26: qty 30

## 2016-01-26 MED ORDER — LIDOCAINE HCL (PF) 4 % IJ SOLN
INTRAMUSCULAR | Status: AC
  Filled 2016-01-26: qty 5

## 2016-01-26 MED ORDER — MIDAZOLAM HCL 2 MG/2ML IJ SOLN
INTRAMUSCULAR | Status: DC | PRN
  Administered 2016-01-26: 1 mg via INTRAVENOUS

## 2016-01-26 MED ORDER — TETRACAINE HCL 0.5 % OP SOLN
OPHTHALMIC | Status: DC | PRN
  Administered 2016-01-26: 1 [drp] via OPHTHALMIC

## 2016-01-26 MED ORDER — CYCLOPENTOLATE HCL 2 % OP SOLN
OPHTHALMIC | Status: AC
  Filled 2016-01-26: qty 2

## 2016-01-26 MED ORDER — TETRACAINE HCL 0.5 % OP SOLN
OPHTHALMIC | Status: AC
  Filled 2016-01-26: qty 2

## 2016-01-26 MED ORDER — MOXIFLOXACIN HCL 0.5 % OP SOLN
OPHTHALMIC | Status: AC
  Administered 2016-01-26: 09:00:00
  Filled 2016-01-26: qty 3

## 2016-01-26 SURGICAL SUPPLY — 31 items
CANNULA ANT/CHMB 27GA (MISCELLANEOUS) ×3 IMPLANT
CLOSURE WOUND 1/2 X4 (GAUZE/BANDAGES/DRESSINGS) ×1
CORD BIP STRL DISP 12FT (MISCELLANEOUS) ×3 IMPLANT
CUP MEDICINE 2OZ PLAST GRAD ST (MISCELLANEOUS) ×3 IMPLANT
DRAPE XRAY CASSETTE 23X24 (DRAPES) ×3 IMPLANT
ERASER HMR WETFIELD 18G (MISCELLANEOUS) ×3 IMPLANT
GLOVE BIO SURGEON STRL SZ8 (GLOVE) ×3 IMPLANT
GLOVE SURG LX 6.5 MICRO (GLOVE) ×2
GLOVE SURG LX 8.0 MICRO (GLOVE) ×2
GLOVE SURG LX STRL 6.5 MICRO (GLOVE) ×1 IMPLANT
GLOVE SURG LX STRL 8.0 MICRO (GLOVE) ×1 IMPLANT
GOWN STRL REUS W/ TWL LRG LVL3 (GOWN DISPOSABLE) ×1 IMPLANT
GOWN STRL REUS W/ TWL XL LVL3 (GOWN DISPOSABLE) ×1 IMPLANT
GOWN STRL REUS W/TWL LRG LVL3 (GOWN DISPOSABLE) ×2
GOWN STRL REUS W/TWL XL LVL3 (GOWN DISPOSABLE) ×2
LENS IOL ACRYSOF IQ 22.5 (Intraocular Lens) ×3 IMPLANT
PACK CATARACT (MISCELLANEOUS) ×3 IMPLANT
PACK CATARACT DINGLEDEIN LX (MISCELLANEOUS) ×3 IMPLANT
PACK EYE AFTER SURG (MISCELLANEOUS) ×3 IMPLANT
SHLD EYE VISITEC  UNIV (MISCELLANEOUS) ×3 IMPLANT
SOL BSS BAG (MISCELLANEOUS) ×3
SOL PREP PVP 2OZ (MISCELLANEOUS) ×3
SOLUTION BSS BAG (MISCELLANEOUS) ×1 IMPLANT
SOLUTION PREP PVP 2OZ (MISCELLANEOUS) ×1 IMPLANT
STRIP CLOSURE SKIN 1/2X4 (GAUZE/BANDAGES/DRESSINGS) ×2 IMPLANT
SUT SILK 5-0 (SUTURE) ×3 IMPLANT
SYR 3ML LL SCALE MARK (SYRINGE) ×3 IMPLANT
SYR 5ML LL (SYRINGE) ×3 IMPLANT
SYR TB 1ML 27GX1/2 LL (SYRINGE) ×3 IMPLANT
WATER STERILE IRR 1000ML POUR (IV SOLUTION) ×3 IMPLANT
WIPE NON LINTING 3.25X3.25 (MISCELLANEOUS) ×3 IMPLANT

## 2016-01-26 NOTE — Anesthesia Preprocedure Evaluation (Addendum)
Anesthesia Evaluation  Patient identified by MRN, date of birth, ID band Patient awake    Reviewed: Allergy & Precautions, NPO status , Patient's Chart, lab work & pertinent test results  Airway Mallampati: II  TM Distance: >3 FB Neck ROM: Limited    Dental  (+) Upper Dentures, Lower Dentures   Pulmonary shortness of breath and with exertion, COPD,  COPD inhaler, former smoker,    Pulmonary exam normal        Cardiovascular Exercise Tolerance: Good hypertension, Pt. on medications and Pt. on home beta blockers + CAD, + Past MI and + CABG  Normal cardiovascular exam  Two vessel CABG at [facility] in 2015, and has done well since. No longer as short of breath.   Neuro/Psych    GI/Hepatic hiatal hernia, GERD  Medicated and Controlled,  Endo/Other    Renal/GU      Musculoskeletal   Abdominal (+)  Abdomen: soft.    Peds  Hematology   Anesthesia Other Findings Ht 5\' 7" , wt 165 lbs, BMI 25.8.  Reproductive/Obstetrics                           Anesthesia Physical Anesthesia Plan  ASA: III  Anesthesia Plan: MAC   Post-op Pain Management:    Induction: Intravenous  Airway Management Planned: Nasal Cannula  Additional Equipment:   Intra-op Plan:   Post-operative Plan:   Informed Consent: I have reviewed the patients History and Physical, chart, labs and discussed the procedure including the risks, benefits and alternatives for the proposed anesthesia with the patient or authorized representative who has indicated his/her understanding and acceptance.     Plan Discussed with:   Anesthesia Plan Comments:         Anesthesia Quick Evaluation

## 2016-01-26 NOTE — Op Note (Signed)
Date of Surgery: 01/26/2016 Date of Dictation: 01/26/2016 10:29 AM Pre-operative Diagnosis:  Nuclear Sclerotic Cataract right Eye Post-operative Diagnosis: same Procedure performed: Extra-capsular Cataract Extraction (ECCE) with placement of a posterior chamber intraocular lens (IOL) right Eye IOL:  Implant Name Type Inv. Item Serial No. Manufacturer Lot No. LRB No. Used  LENS IOL ACRYSOF IQ 22.5 - [id number] Intraocular Lens LENS IOL ACRYSOF IQ 22.5 [id number] ALCON   Right 1   Anesthesia: 2% Lidocaine and 4% Marcaine in a 50/50 mixture with 10 unites/ml of Hylenex given as a peribulbar Anesthesiologist: Anesthesiologist: Elyse Hsu, MD CRNA: Allean Found, CRNA Complications: none Estimated Blood Loss: less than 1 ml  Description of procedure:  The patient was given anesthesia and sedation via intravenous access. The patient was then prepped and draped in the usual fashion. A 25-gauge needle was bent for initiating the capsulorhexis. A 5-0 silk suture was placed through the conjunctiva superior and inferiorly to serve as bridle sutures. Hemostasis was obtained at the superior limbus using an eraser cautery. A partial thickness groove was made at the anterior surgical limbus with a 64 Beaver blade and this was dissected anteriorly with an Avaya. The anterior chamber was entered at 10 o'clock with a 1.0 mm paracentesis knife and through the lamellar dissection with a 2.6 mm Alcon keratome. Epi-Shugarcaine 0.5 CC [9 cc BSS Plus (Alcon), 3 cc 4% preservative-free lidocaine (Hospira) and 4 cc 1:1000 preservative-free, bisulfite-free epinephrine] was injected into the anterior chamber via the paracentesis tract. Epi-Shugarcaine 0.5 CC [9 cc BSS Plus (Alcon), 3 cc 4% preservative-free lidocaine (Hospira) and 4 cc 1:1000 preservative-free, bisulfite-free epinephrine] was injected into the anterior chamber via the paracentesis tract. DiscoVisc was injected to replace the aqueous and a  continuous tear curvilinear capsulorhexis was performed using a bent 25-gauge needle.  Balance salt on a syringe was used to perform hydro-dissection and phacoemulsification was carried out using a divide and conquer technique. Procedure(s) with comments: CATARACT EXTRACTION PHACO AND INTRAOCULAR LENS PLACEMENT (IOC) (Right) - [place] 01:13 AP% 25.3 CDE 33.95 fluid pack lot # [id number]. Irrigation/aspiration was used to remove the residual cortex and the capsular bag was inflated with DiscoVisc. The intraocular lens was inserted into the capsular bag using a pre-loaded UltraSert Delivery System. Irrigation/aspiration was used to remove the residual DiscoVisc. The wound was inflated with balanced salt and checked for leaks. None were found. Miostat was injected via the paracentesis track and 0.1 ml of cefuroxime containing 1 mg of drug  was injected via the paracentesis track. The wound was checked for leaks again and none were found.   The bridal sutures were removed and two drops of Vigamox were placed on the eye. An eye shield was placed to protect the eye and the patient was discharged to the recovery area in good condition.   Temesha Queener MD

## 2016-01-26 NOTE — Anesthesia Postprocedure Evaluation (Signed)
Anesthesia Post Note  Patient: John Burns  Procedure(s) Performed: Procedure(s) (LRB): CATARACT EXTRACTION PHACO AND INTRAOCULAR LENS PLACEMENT (IOC) (Right)  Patient location during evaluation: Short Stay Anesthesia Type: MAC Level of consciousness: awake Pain management: pain level controlled Vital Signs Assessment: post-procedure vital signs reviewed and stable Respiratory status: spontaneous breathing Cardiovascular status: blood pressure returned to baseline Postop Assessment: no headache Anesthetic complications: no    Last Vitals:  Filed Vitals:   01/26/16 0827 01/26/16 1034  BP: 156/71 155/63  Pulse: 52 56  Temp: 36.6 C 35.6 C  Resp: 16 16    Last Pain: There were no vitals filed for this visit.               Buckner Malta

## 2016-01-26 NOTE — Discharge Instructions (Signed)
Eye Surgery Discharge Instructions  Expect mild scratchy sensation or mild soreness. DO NOT RUB YOUR EYE!  The day of surgery:  Minimal physical activity, but bed rest is not required  No reading, computer work, or close hand work  No bending, lifting, or straining.  May watch TV  For 24 hours:  No driving, legal decisions, or alcoholic beverages  Safety precautions  Eat anything you prefer: It is better to start with liquids, then soup then solid foods.  _____ Eye patch should be worn until postoperative exam tomorrow.  ____ Solar shield eyeglasses should be worn for comfort in the sunlight/patch while sleeping  Resume all regular medications including aspirin or Coumadin if these were discontinued prior to surgery. You may shower, bathe, shave, or wash your hair. Tylenol may be taken for mild discomfort.  Call your doctor if you experience significant pain, nausea, or vomiting, fever > 101 or other signs of infection. [phone] or [phone] Specific instructions:  Follow-up Information    Follow up with Estill Cotta, MD.   Specialty:  Ophthalmology   Why:  follow up 01/27/16 at [place] information:   [place] [id number] [phone]     AMBULATORY SURGERY  DISCHARGE INSTRUCTIONS   1) The drugs that you were given will stay in your system until tomorrow so for the next 24 hours you should not:  A) Drive an automobile B) Make any legal decisions C) Drink any alcoholic beverage   2) You may resume regular meals tomorrow.  Today it is better to start with liquids and gradually work up to solid foods.  You may eat anything you prefer, but it is better to start with liquids, then soup and crackers, and gradually work up to solid foods.   3) Please notify your doctor immediately if you have any unusual bleeding, trouble breathing, redness and pain at the surgery site, drainage, fever, or pain not relieved by  medication.    4) Additional Instructions:        Please contact your physician with any problems or Same Day Surgery at [phone], Monday through Friday 6 am to 4 pm, or [place] at [facility] number at [phone].

## 2016-01-26 NOTE — Anesthesia Procedure Notes (Signed)
Procedure Name: MAC Date/Time: 01/26/2016 9:50 AM Performed by: Allean Found Pre-anesthesia Checklist: Patient identified, Emergency Drugs available, Suction available, Timeout performed and Patient being monitored Patient Re-evaluated:Patient Re-evaluated prior to inductionOxygen Delivery Method: Nasal cannula Preoxygenation: Pre-oxygenation with 100% oxygen Intubation Type: IV induction

## 2016-01-26 NOTE — Transfer of Care (Signed)
Immediate Anesthesia Transfer of Care Note  Patient: John Burns  Procedure(s) Performed: Procedure(s) with comments: CATARACT EXTRACTION PHACO AND INTRAOCULAR LENS PLACEMENT (IOC) (Right) - [place] 01:13 AP% 25.3 CDE 33.95 fluid pack lot # [id number]  Patient Location: PACU  Anesthesia Type:MAC  Level of Consciousness: awake  Airway & Oxygen Therapy: Patient Spontanous Breathing  Post-op Assessment: Report given to RN and Post -op Vital signs reviewed and stable  Post vital signs: Reviewed and stable  Last Vitals:  Filed Vitals:   01/26/16 0827  BP: 156/71  Pulse: 52  Temp: 36.6 C  Resp: 16    Complications: No apparent anesthesia complications

## 2016-01-26 NOTE — Interval H&P Note (Signed)
History and Physical Interval Note:  01/26/2016 9:41 AM  John Burns  has presented today for surgery, with the diagnosis of cataract  The various methods of treatment have been discussed with the patient and family. After consideration of risks, benefits and other options for treatment, the patient has consented to  Procedure(s): CATARACT EXTRACTION PHACO AND INTRAOCULAR LENS PLACEMENT ([place]) (Right) as a surgical intervention .  The patient's history has been reviewed, patient examined, no change in status, stable for surgery.  I have reviewed the patient's chart and labs.  Questions were answered to the patient's satisfaction.     Carlin Attridge

## 2016-02-11 ENCOUNTER — Ambulatory Visit (INDEPENDENT_AMBULATORY_CARE_PROVIDER_SITE_OTHER): Payer: Medicare Other | Admitting: *Deleted

## 2016-02-11 DIAGNOSIS — E538 Deficiency of other specified B group vitamins: Secondary | ICD-10-CM | POA: Diagnosis not present

## 2016-02-11 MED ORDER — CYANOCOBALAMIN 1000 MCG/ML IJ SOLN
1000.0000 ug | Freq: Once | INTRAMUSCULAR | Status: AC
  Administered 2016-02-11: 1000 ug via INTRAMUSCULAR

## 2016-02-11 MED ORDER — ATORVASTATIN CALCIUM 20 MG PO TABS: 20.0000 mg | ORAL_TABLET | Freq: Every day | ORAL | Status: DC

## 2016-03-16 ENCOUNTER — Ambulatory Visit (INDEPENDENT_AMBULATORY_CARE_PROVIDER_SITE_OTHER): Payer: Medicare Other

## 2016-03-16 DIAGNOSIS — E538 Deficiency of other specified B group vitamins: Secondary | ICD-10-CM

## 2016-03-16 MED ORDER — CYANOCOBALAMIN 1000 MCG/ML IJ SOLN
1000.0000 ug | Freq: Once | INTRAMUSCULAR | Status: AC
  Administered 2016-03-16: 1000 ug via INTRAMUSCULAR

## 2016-03-16 NOTE — Progress Notes (Signed)
Patient came in for a B12 injection.  Received in Right deltoid.  Patient tolerated well.   

## 2016-04-13 ENCOUNTER — Ambulatory Visit (INDEPENDENT_AMBULATORY_CARE_PROVIDER_SITE_OTHER): Payer: Medicare Other

## 2016-04-13 ENCOUNTER — Ambulatory Visit: Payer: Medicare Other

## 2016-04-13 DIAGNOSIS — E538 Deficiency of other specified B group vitamins: Secondary | ICD-10-CM

## 2016-04-13 MED ORDER — CYANOCOBALAMIN 1000 MCG/ML IJ SOLN
1000.0000 ug | Freq: Once | INTRAMUSCULAR | Status: AC
  Administered 2016-04-13: 1000 ug via INTRAMUSCULAR

## 2016-04-13 NOTE — Progress Notes (Signed)
Patient presented for B12 injection, per Dr. Lupita Dawn order.  Administered in L deltoid, tolerated well.  Verbalized understanding of the need to make next monthly appointment.

## 2016-05-20 ENCOUNTER — Ambulatory Visit (INDEPENDENT_AMBULATORY_CARE_PROVIDER_SITE_OTHER): Payer: Medicare Other | Admitting: Internal Medicine

## 2016-05-20 ENCOUNTER — Encounter: Payer: Self-pay | Admitting: Internal Medicine

## 2016-05-20 VITALS — BP 128/74 | HR 58 | Temp 97.5°F | Resp 12 | Ht 68.0 in | Wt 165.5 lb

## 2016-05-20 DIAGNOSIS — I2510 Atherosclerotic heart disease of native coronary artery without angina pectoris: Secondary | ICD-10-CM | POA: Diagnosis not present

## 2016-05-20 DIAGNOSIS — R5383 Other fatigue: Secondary | ICD-10-CM | POA: Diagnosis not present

## 2016-05-20 DIAGNOSIS — E559 Vitamin D deficiency, unspecified: Secondary | ICD-10-CM | POA: Diagnosis not present

## 2016-05-20 DIAGNOSIS — E538 Deficiency of other specified B group vitamins: Secondary | ICD-10-CM | POA: Diagnosis not present

## 2016-05-20 DIAGNOSIS — Z0000 Encounter for general adult medical examination without abnormal findings: Secondary | ICD-10-CM

## 2016-05-20 DIAGNOSIS — E785 Hyperlipidemia, unspecified: Secondary | ICD-10-CM | POA: Diagnosis not present

## 2016-05-20 DIAGNOSIS — R748 Abnormal levels of other serum enzymes: Secondary | ICD-10-CM

## 2016-05-20 LAB — CBC WITH DIFFERENTIAL/PLATELET
BASOS PCT: 0.8 % (ref 0.0–3.0)
Basophils Absolute: 0.1 10*3/uL (ref 0.0–0.1)
EOS ABS: 0.3 10*3/uL (ref 0.0–0.7)
Eosinophils Relative: 4.2 % (ref 0.0–5.0)
HCT: 44.3 % (ref 39.0–52.0)
HEMOGLOBIN: 14.5 g/dL (ref 13.0–17.0)
Lymphocytes Relative: 19.4 % (ref 12.0–46.0)
Lymphs Abs: 1.4 10*3/uL (ref 0.7–4.0)
MCHC: 32.8 g/dL (ref 30.0–36.0)
MCV: 88.5 fl (ref 78.0–100.0)
MONO ABS: 0.8 10*3/uL (ref 0.1–1.0)
Monocytes Relative: 11.1 % (ref 3.0–12.0)
NEUTROS PCT: 64.5 % (ref 43.0–77.0)
Neutro Abs: 4.7 10*3/uL (ref 1.4–7.7)
Platelets: 208 10*3/uL (ref 150.0–400.0)
RBC: 5 Mil/uL (ref 4.22–5.81)
RDW: 15.7 % — AB (ref 11.5–15.5)
WBC: 7.2 10*3/uL (ref 4.0–10.5)

## 2016-05-20 LAB — COMPREHENSIVE METABOLIC PANEL
ALT: 13 U/L (ref 0–53)
AST: 20 U/L (ref 0–37)
Albumin: 4.2 g/dL (ref 3.5–5.2)
Alkaline Phosphatase: 142 U/L — ABNORMAL HIGH (ref 39–117)
BUN: 15 mg/dL (ref 6–23)
CHLORIDE: 108 meq/L (ref 96–112)
CO2: 26 meq/L (ref 19–32)
CREATININE: 1.07 mg/dL (ref 0.40–1.50)
Calcium: 9.7 mg/dL (ref 8.4–10.5)
GFR: 71.51 mL/min (ref >60.00)
Glucose, Bld: 98 mg/dL (ref 70–99)
Potassium: 4.9 mEq/L (ref 3.5–5.1)
SODIUM: 140 meq/L (ref 135–145)
Total Bilirubin: 0.7 mg/dL (ref 0.2–1.2)
Total Protein: 6.6 g/dL (ref 6.0–8.3)

## 2016-05-20 LAB — LIPID PANEL
CHOLESTEROL: 146 mg/dL (ref 0–200)
HDL: 47.5 mg/dL (ref >39.00)
LDL CALC: 87 mg/dL (ref 0–99)
NONHDL: 98.24
Total CHOL/HDL Ratio: 3
Triglycerides: 54 mg/dL (ref 0.0–149.0)
VLDL: 10.8 mg/dL (ref 0.0–40.0)

## 2016-05-20 LAB — TSH: TSH: 3.12 u[IU]/mL (ref 0.35–4.50)

## 2016-05-20 LAB — VITAMIN B12: Vitamin B-12: >1500 pg/mL — ABNORMAL HIGH (ref 211–911)

## 2016-05-20 LAB — VITAMIN D 25 HYDROXY (VIT D DEFICIENCY, FRACTURES): VITD: 34.77 ng/mL (ref 30.00–100.00)

## 2016-05-20 MED ORDER — CYANOCOBALAMIN 1000 MCG/ML IJ SOLN
1000.0000 ug | Freq: Once | INTRAMUSCULAR | Status: AC
  Administered 2016-05-20: 1000 ug via INTRAMUSCULAR

## 2016-05-20 NOTE — Progress Notes (Signed)
B 12 injection given in right deltoid during visit.

## 2016-05-20 NOTE — Progress Notes (Signed)
Pre-visit discussion using our clinic review tool. No additional management support is needed unless otherwise documented below in the visit note.  

## 2016-05-20 NOTE — Progress Notes (Signed)
Patient ID: John Burns, male    DOB: 1941/10/28  Age: 75 y.o. MRN: [id number]   CC: The primary encounter diagnosis was B12 deficiency. Diagnoses of Other fatigue, Vitamin D deficiency, and Hyperlipidemia were also pertinent to this visit.   Wound right forearm ,  Tore the skin on the external part of raccoon cage.  Tdap up to date,  rabies prophylaxis  reviewed.  Not taking vitamin D since finishing the megadose of Vitamin D B12 deficiency  Managed with  injections .  Starts to feel tired by the end of week 3 .  Due today .   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  Similar loss last summer   Fasting today     Review of Systems  Objective:  BP 128/74 mmHg  Pulse 58  Temp(Src) 97.5 F (36.4 C) (Oral)  Resp 12  Ht 5\' 8"  (1.727 m)  Wt 165 lb 8 oz (75.07 kg)  BMI 25.17 kg/m2  SpO2 96%  Physical Exam    Assessment & Plan:   Problem List Items Addressed This Visit    Vitamin D deficiency   Relevant Orders   VITAMIN D 25 Hydroxy (Vit-D Deficiency, Fractures) (Completed)   B12 deficiency - Primary   Relevant Medications   cyanocobalamin ((VITAMIN B-12)) injection 1,000 mcg (Completed)   Other Relevant Orders   B12 (Completed)   Methylmalonic Acid    Other Visit Diagnoses    Other fatigue        Relevant Orders    Comprehensive metabolic panel (Completed)    CBC with Differential/Platelet (Completed)    TSH (Completed)    Hyperlipidemia        Relevant Orders    Lipid panel (Completed)       I am having John Burns maintain his vitamin E, sildenafil, ergocalciferol, CARTIA XT, metoprolol, omeprazole, aspirin, and atorvastatin. We administered cyanocobalamin.  Meds ordered this encounter  Medications  . cyanocobalamin ((VITAMIN B-12)) injection 1,000 mcg    Sig:     There are no discontinued medications.  Follow-up: Return in about 6 months (around 11/19/2016) for wellness with denisa 1 month .   John Mc, MD

## 2016-05-20 NOTE — Patient Instructions (Addendum)
Remember to protect yourself from raccoon saliva,  Which can carry rabies.  We will make you a return visit for your  annual wellness visit  With John Burns

## 2016-05-21 ENCOUNTER — Ambulatory Visit: Payer: Medicare Other | Admitting: Internal Medicine

## 2016-05-22 NOTE — Assessment & Plan Note (Addendum)
Vit D has dropped again,  Drisdol weekly x  3 months

## 2016-05-22 NOTE — Assessment & Plan Note (Signed)
LDL and triglycerides are at goal on current medications. He has no side effects and liver enzymes are normal. No changes today  

## 2016-05-22 NOTE — Assessment & Plan Note (Addendum)
Only his alk phos remains mildly  is elevated,  Will recommend evaluation with ultrasound.   Lab Results  Component Value Date   ALT 13 05/20/2016   AST 20 05/20/2016   ALKPHOS 142* 05/20/2016   BILITOT 0.7 05/20/2016

## 2016-05-22 NOTE — Progress Notes (Addendum)
Subjective:  Patient ID: John Burns, male    DOB: 1941-07-21  Age: 75 y.o. MRN: [id number]  CC: The primary encounter diagnosis was B12 deficiency. Diagnoses of Other fatigue, Vitamin D deficiency, Hyperlipidemia, Medicare annual wellness visit, subsequent, Elevated alkaline phosphatase level, and Arteriosclerosis of coronary artery were also pertinent to this visit.  HPI TRAMANE GORUM presents for follow up on chronic conditions.  Wound right forearm ,  Tore the skin on the external part of raccoon cage.  Tdap up to date,  rabies prophylaxis  reviewed.  Not taking vitamin D since finishing the megadose of Vitamin D B12 deficiency  Managed with  injections .  Starts to feel tired by the end of week 3 .  Due today .   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  Similar loss last summer   Fasting today     Outpatient Prescriptions Prior to Visit  Medication Sig Dispense Refill  . aspirin 81 MG tablet Take 1 tablet (81 mg total) by mouth daily. 90 tablet 1  . atorvastatin (LIPITOR) 20 MG tablet Take 1 tablet (20 mg total) by mouth daily at 6 PM. 90 tablet 2  . CARTIA XT 180 MG 24 hr capsule TAKE ONE CAPSULE BY MOUTH ONCE DAILY 90 capsule 1  . metoprolol (LOPRESSOR) 50 MG tablet Take 1 tablet (50 mg total) by mouth daily. 90 tablet 1  . omeprazole (PRILOSEC) 20 MG capsule Take 1 capsule (20 mg total) by mouth daily. 90 capsule 1  . sildenafil (VIAGRA) 100 MG tablet Take 1 tablet (100 mg total) by mouth daily as needed for erectile dysfunction. 10 tablet 3  . vitamin E 400 UNIT capsule Take 400 Units by mouth daily.    . ergocalciferol (DRISDOL) 50000 units capsule Take 1 capsule (50,000 Units total) by mouth once a week. (Patient not taking: Reported on 05/20/2016) 12 capsule 0   No facility-administered medications prior to visit.    Review of Systems;  Patient denies headache, fevers, malaise, unintentional weight loss, skin rash, eye pain, sinus  congestion and sinus pain, sore throat, dysphagia,  hemoptysis , cough, dyspnea, wheezing, chest pain, palpitations, orthopnea, edema, abdominal pain, nausea, melena, diarrhea, constipation, flank pain, dysuria, hematuria, urinary  Frequency, nocturia, numbness, tingling, seizures,  Focal weakness, Loss of consciousness,  Tremor, insomnia, depression, anxiety, and suicidal ideation.      Objective:  BP 128/74 mmHg  Pulse 58  Temp(Src) 97.5 F (36.4 C) (Oral)  Resp 12  Ht 5' 8"  (1.727 m)  Wt 165 lb 8 oz (75.07 kg)  BMI 25.17 kg/m2  SpO2 96%  BP Readings from Last 3 Encounters:  05/20/16 128/74  01/26/16 159/66  12/29/15 151/71    Wt Readings from Last 3 Encounters:  05/20/16 165 lb 8 oz (75.07 kg)  12/29/15 175 lb (79.379 kg)  11/21/15 170 lb 9.6 oz (77.384 kg)    General appearance: alert, cooperative and appears stated age Ears: normal TM's and external ear canals both ears Throat: lips, mucosa, and tongue normal; teeth and gums normal Neck: no adenopathy, no carotid bruit, supple, symmetrical, trachea midline and thyroid not enlarged, symmetric, no tenderness/mass/nodules Back: symmetric, no curvature. ROM normal. No CVA tenderness. Lungs: clear to auscultation bilaterally Heart: regular rate and rhythm, S1, S2 normal, no murmur, click, rub or gallop Abdomen: soft, non-tender; bowel sounds normal; no masses,  no organomegaly Pulses: 2+ and symmetric Skin: Skin color, texture, turgor normal. No rashes  or lesions Lymph nodes: Cervical, supraclavicular, and axillary nodes normal.  No results found for: HGBA1C  Lab Results  Component Value Date   CREATININE 1.07 05/20/2016   CREATININE 0.98 11/21/2015   CREATININE 1.05 05/22/2015    Lab Results  Component Value Date   WBC 7.2 05/20/2016   HGB 14.5 05/20/2016   HCT 44.3 05/20/2016   PLT 208.0 05/20/2016   GLUCOSE 98 05/20/2016   CHOL 146 05/20/2016   TRIG 54.0 05/20/2016   HDL 47.50 05/20/2016   LDLCALC 87  05/20/2016   ALT 13 05/20/2016   AST 20 05/20/2016   NA 140 05/20/2016   K 4.9 05/20/2016   CL 108 05/20/2016   CREATININE 1.07 05/20/2016   BUN 15 05/20/2016   CO2 26 05/20/2016   TSH 3.12 05/20/2016   PSA 2.33 05/22/2015    No results found.  Assessment & Plan:   Problem List Items Addressed This Visit    Vitamin D deficiency    Vit D has dropped again,  Drisdol weekly x  3 months      Relevant Orders   VITAMIN D 25 Hydroxy (Vit-D Deficiency, Fractures) (Completed)   Arteriosclerosis of coronary artery    LDL and triglycerides are at goal on current medications. He has no side effects and liver enzymes are normal. No changes today.        RESOLVED: Medicare annual wellness visit, subsequent    Annual Medicare wellness  exam was done as well as a comprehensive physical exam and management of acute and chronic conditions .  During the course of the visit the patient was educated and counseled about appropriate screening and preventive services including : fall prevention , diabetes screening, nutrition counseling, colorectal cancer screening, and recommended immunizations.  Printed recommendations for health maintenance screenings was given.       Elevated alkaline phosphatase level    Only his alk phos remains mildly  is elevated,  Will recommend evaluation with ultrasound.   Lab Results  Component Value Date   ALT 13 05/20/2016   AST 20 05/20/2016   ALKPHOS 142* 05/20/2016   BILITOT 0.7 05/20/2016           Relevant Orders   US Abdomen Limited   B12 deficiency - Primary    Secondary to prolonged ppi use which is necessary  Due to history of Barrett;s esophagus, injections monthly/  Lab Results  Component Value Date   VITAMINB12 >1500* 05/20/2016          Relevant Medications   cyanocobalamin ((VITAMIN B-12)) injection 1,000 mcg (Completed)   Other Relevant Orders   B12 (Completed)   Methylmalonic Acid    Other Visit Diagnoses    Other fatigue         Relevant Orders    Comprehensive metabolic panel (Completed)    CBC with Differential/Platelet (Completed)    TSH (Completed)    Hyperlipidemia        Relevant Orders    Lipid panel (Completed)       I am having Mr. Punch start on ergocalciferol. I am also having him maintain his vitamin E, sildenafil, ergocalciferol, CARTIA XT, metoprolol, omeprazole, aspirin, and atorvastatin. We administered cyanocobalamin.  Meds ordered this encounter  Medications  . cyanocobalamin ((VITAMIN B-12)) injection 1,000 mcg    Sig:   . ergocalciferol (DRISDOL) 50000 units capsule    Sig: Take 1 capsule (50,000 Units total) by mouth once a week.    Dispense:  12 capsule  Refill:  0    There are no discontinued medications.  Follow-up: Return in about 6 months (around 11/19/2016) for wellness with denisa 1 month .   Crecencio Mc, MD

## 2016-05-22 NOTE — Assessment & Plan Note (Signed)
Secondary to prolonged ppi use which is necessary  Due to history of Barrett;s esophagus, injections monthly/  Lab Results  Component Value Date   VITAMINB12 >1500* 05/20/2016

## 2016-05-22 NOTE — Assessment & Plan Note (Signed)

## 2016-05-23 LAB — METHYLMALONIC ACID, SERUM: METHYLMALONIC ACID, QUANT: 80 nmol/L — AB (ref 87–318)

## 2016-05-23 MED ORDER — ERGOCALCIFEROL 1.25 MG (50000 UT) PO CAPS: 50000.0000 [IU] | ORAL_CAPSULE | ORAL | Status: DC

## 2016-05-23 NOTE — Addendum Note (Signed)
Addended by: Crecencio Mc on: 05/23/2016 10:08 AM   Modules accepted: Orders

## 2016-05-24 ENCOUNTER — Telehealth: Payer: Self-pay | Admitting: Internal Medicine

## 2016-05-24 NOTE — Telephone Encounter (Signed)
Patient wife notified of labs with the appointment for [place] . Explained per results looking for bile duct stone.

## 2016-05-24 NOTE — Telephone Encounter (Signed)
Dr. Derrel Nip ordered an abdominal u/s for pt. He is scheduled for 7/6. He is aware of the appointment but he doesn't know why he needs it. I told him it was for his elevated alkaline phosphatase but he needs further explanation. He said that he will not be in the house much longer but can speak to his wife.

## 2016-05-26 ENCOUNTER — Telehealth: Payer: Self-pay | Admitting: Internal Medicine

## 2016-05-26 DIAGNOSIS — L570 Actinic keratosis: Secondary | ICD-10-CM | POA: Diagnosis not present

## 2016-05-26 DIAGNOSIS — Z1283 Encounter for screening for malignant neoplasm of skin: Secondary | ICD-10-CM | POA: Diagnosis not present

## 2016-05-26 NOTE — Telephone Encounter (Signed)
Please advise Order needs to be co-signed by provider.  Order was updated on 7/1. thanks

## 2016-05-26 NOTE — Telephone Encounter (Signed)
[place] called from [facility] regarding the [place] order needing to be signed off on before pt arrives for his appt tomorrow.  She said she put it back in EPIC to be signed. Thank you!

## 2016-05-27 ENCOUNTER — Ambulatory Visit
Admission: RE | Admit: 2016-05-27 | Discharge: 2016-05-27 | Disposition: A | Discharge status: Home / Self Care | Payer: Medicare Other | Source: Ambulatory Visit | Attending: Internal Medicine | Admitting: Internal Medicine

## 2016-05-27 DIAGNOSIS — R748 Abnormal levels of other serum enzymes: Secondary | ICD-10-CM | POA: Insufficient documentation

## 2016-05-27 DIAGNOSIS — R634 Abnormal weight loss: Secondary | ICD-10-CM | POA: Diagnosis not present

## 2016-05-30 ENCOUNTER — Other Ambulatory Visit: Payer: Self-pay | Admitting: Internal Medicine

## 2016-06-23 ENCOUNTER — Ambulatory Visit (INDEPENDENT_AMBULATORY_CARE_PROVIDER_SITE_OTHER): Payer: Medicare Other

## 2016-06-23 ENCOUNTER — Ambulatory Visit: Payer: Medicare Other

## 2016-06-23 DIAGNOSIS — E538 Deficiency of other specified B group vitamins: Secondary | ICD-10-CM | POA: Diagnosis not present

## 2016-06-23 MED ORDER — CYANOCOBALAMIN 1000 MCG/ML IJ SOLN
1000.0000 ug | Freq: Once | INTRAMUSCULAR | Status: AC
  Administered 2016-06-23: 1000 ug via INTRAMUSCULAR

## 2016-06-23 NOTE — Progress Notes (Signed)
Patient came in for b12 injection, received in right deltoid.  Patient tolerated well.

## 2016-06-29 DIAGNOSIS — E7800 Pure hypercholesterolemia, unspecified: Secondary | ICD-10-CM | POA: Diagnosis not present

## 2016-06-29 DIAGNOSIS — I471 Supraventricular tachycardia: Secondary | ICD-10-CM | POA: Diagnosis not present

## 2016-06-29 DIAGNOSIS — Z951 Presence of aortocoronary bypass graft: Secondary | ICD-10-CM | POA: Diagnosis not present

## 2016-06-29 DIAGNOSIS — I25810 Atherosclerosis of coronary artery bypass graft(s) without angina pectoris: Secondary | ICD-10-CM | POA: Diagnosis not present

## 2016-06-29 DIAGNOSIS — J410 Simple chronic bronchitis: Secondary | ICD-10-CM | POA: Diagnosis not present

## 2016-06-29 DIAGNOSIS — I208 Other forms of angina pectoris: Secondary | ICD-10-CM | POA: Diagnosis not present

## 2016-06-29 DIAGNOSIS — Z9889 Other specified postprocedural states: Secondary | ICD-10-CM | POA: Diagnosis not present

## 2016-06-29 DIAGNOSIS — R0602 Shortness of breath: Secondary | ICD-10-CM | POA: Diagnosis not present

## 2016-06-29 DIAGNOSIS — R079 Chest pain, unspecified: Secondary | ICD-10-CM | POA: Diagnosis not present

## 2016-07-15 ENCOUNTER — Telehealth: Payer: Self-pay | Admitting: Internal Medicine

## 2016-07-15 MED ORDER — METOPROLOL TARTRATE 50 MG PO TABS: 50.0000 mg | ORAL_TABLET | Freq: Every day | ORAL | 1 refills | Status: DC

## 2016-07-15 NOTE — Telephone Encounter (Signed)
Pt wife called about pt needing a refill for metoprolol (LOPRESSOR) 50 MG tablet  Pharmacy is [facility], [place]  Call wife @ [phone]. Thank you!

## 2016-07-15 NOTE — Telephone Encounter (Signed)
Medication refill

## 2016-07-19 ENCOUNTER — Ambulatory Visit (INDEPENDENT_AMBULATORY_CARE_PROVIDER_SITE_OTHER): Payer: Medicare Other

## 2016-07-19 VITALS — BP 138/70 | HR 58 | Temp 97.8°F | Resp 14 | Ht 65.5 in | Wt 162.1 lb

## 2016-07-19 DIAGNOSIS — Z0000 Encounter for general adult medical examination without abnormal findings: Secondary | ICD-10-CM | POA: Diagnosis not present

## 2016-07-19 NOTE — Patient Instructions (Addendum)
  John Burns , Thank you for taking time to come for your Medicare Wellness Visit. I appreciate your ongoing commitment to your health goals. Please review the following plan we discussed and let me know if I can assist you in the future.   RETURN FOR B12 injection AND FLU VACCINE.  FOLLOW UP WITH ADVANCED DIRECTIVES (HEALTH CARE POWER OF ATTORNEY/LIVING WILL)   These are the goals we discussed: Goals    . Healthy Lifestyle          Stay hydrated and drink plenty of water Stay active and continue exercising Low carb foods. Lean meats, vegetables.       This is a list of the screening recommended for you and due dates:  Health Maintenance  Topic Date Due  . Flu Shot  06/22/2016  . Tetanus Vaccine  11/22/2019  . Colon Cancer Screening  02/20/2024  . Shingles Vaccine  Completed  . Pneumonia vaccines  Completed

## 2016-07-19 NOTE — Progress Notes (Signed)
Subjective:   John Burns is a 75 y.o. male who presents for Medicare Annual/Subsequent preventive examination.  Review of Systems:  No ROS.  Medicare Wellness Visit.  Cardiac Risk Factors include: advanced age (>58men, >28 women);hypertension;male gender     Objective:    Vitals: BP 138/70 (BP Location: Left Arm, Patient Position: Sitting, Cuff Size: Normal)   Pulse (!) 58   Temp 97.8 F (36.6 C) (Oral)   Resp 14   Ht 5' 5.5" (1.664 m)   Wt 162 lb 1.9 oz (73.5 kg)   SpO2 98%   BMI 26.57 kg/m   Body mass index is 26.57 kg/m.  Tobacco History  Smoking Status  . Former Smoker  . Packs/day: 1.00  . Years: 50.00  . Types: Cigarettes  . Quit date: 11/22/1993  Smokeless Tobacco  . Former Systems developer  . Types: Chew    Comment: used chew to help quit smoking X1 year.     Counseling given: Not Answered   Past Medical History:  Diagnosis Date  . ASCVD (arteriosclerotic cardiovascular disease)   . Bladder neck obstruction   . CAD (coronary artery disease) 2007   s/p PCI to mid LAD  . COPD (chronic obstructive pulmonary disease) ([place])   . Diverticulosis   . GERD (gastroesophageal reflux disease)   . Heart attack ([place])   . History of hiatal hernia   . History of tobacco abuse   . HOH (hard of hearing)   . Hypercholesteremia   . Hypertension   . Shortness of breath dyspnea    on exertion  . Stenosis of coronary stent 2013   Past Surgical History:  Procedure Laterality Date  . ANGIOPLASTY / STENTING FEMORAL    . CARDIAC CATHETERIZATION  05/10/12   patent LAD stent w/ 50% in stent restenosis; 70% stenosis ostium of left circumflex; 75% stenosis mid left circumflex  . CATARACT EXTRACTION W/PHACO Left 12/29/2015   Procedure: CATARACT EXTRACTION PHACO AND INTRAOCULAR LENS PLACEMENT (I[facility]);  Surgeon: Estill Cotta, MD;  Location: [facility] ORS;  Service: Ophthalmology;  Laterality: Left;  [place]: 01:53.8 AP%: 26.2 CDE: 49.38 Lot # [id number]  . CATARACT EXTRACTION W/PHACO Right  01/26/2016   Procedure: CATARACT EXTRACTION PHACO AND INTRAOCULAR LENS PLACEMENT ([facility]);  Surgeon: Estill Cotta, MD;  Location: [facility] ORS;  Service: Ophthalmology;  Laterality: Right;  [place] 01:13 AP% 25.3 CDE 33.95 fluid pack lot # [id number]  . COLONOSCOPY  02/05/14  . CORONARY ARTERY BYPASS GRAFT  06/04/14   x2 [place]  2007  . ESOPHAGOGASTRODUODENOSCOPY  02/05/14   Family History  Problem Relation Age of Onset  . Heart disease Mother   . Cancer Father 40    prostate and colon cancer   History  Sexual Activity  . Sexual activity: Yes    Outpatient Encounter Prescriptions as of 07/19/2016  Medication Sig  . aspirin 81 MG tablet Take 1 tablet (81 mg total) by mouth daily.  Marland Kitchen atorvastatin (LIPITOR) 20 MG tablet Take 1 tablet (20 mg total) by mouth daily at 6 PM.  . CARTIA XT 180 MG 24 hr capsule TAKE ONE CAPSULE BY MOUTH ONCE DAILY  . ergocalciferol (DRISDOL) 50000 units capsule Take 1 capsule (50,000 Units total) by mouth once a week.  . metoprolol (LOPRESSOR) 50 MG tablet Take 1 tablet (50 mg total) by mouth daily.  Marland Kitchen omeprazole (PRILOSEC) 20 MG capsule Take 1 capsule (20 mg total) by mouth daily.  . sildenafil (VIAGRA) 100 MG tablet Take  1 tablet (100 mg total) by mouth daily as needed for erectile dysfunction.  . vitamin E 400 UNIT capsule Take 400 Units by mouth daily.  . [DISCONTINUED] ergocalciferol (DRISDOL) 50000 units capsule Take 1 capsule (50,000 Units total) by mouth once a week. (Patient not taking: Reported on 07/19/2016)   No facility-administered encounter medications on file as of 07/19/2016.     Activities of Daily Living In your present state of health, do you have any difficulty performing the following activities: 07/19/2016  Hearing? N  Vision? N  Difficulty concentrating or making decisions? Y  Walking or climbing stairs? N  Dressing or bathing? N  Doing errands, shopping? N  Preparing Food and eating ? N  Using the Toilet?  N  In the past six months, have you accidently leaked urine? N  Do you have problems with loss of bowel control? N  Managing your Medications? N  Managing your Finances? N  Housekeeping or managing your Housekeeping? N  Some recent data might be hidden    Patient Care Team: Crecencio Mc, MD as PCP - General (Internal Medicine)   Assessment:    This is a routine wellness examination for [facility]. The goal of the wellness visit is to assist the patient how to close the gaps in care and create a preventative care plan for the patient.   Taking calcium VIT D as appropriate/Osteoporosis risk reviewed.  Medications reviewed; taking without issues or barriers.  Safety issues reviewed; smoke and carbon monoxide detectors in the home. Firearms locked in a safe in the home. Wears seatbelts when driving or riding with others. No violence in the home.  No identified risk were noted; The patient was oriented x 3; appropriate in dress and manner and no objective failures at ADL's or IADL's.   [place]ox atrial tachycardia- stable and followed by Dr.Paraschos  Patient Concerns: None at this time. Follow up with PCP as needed.  Exercise Activities and Dietary recommendations Current Exercise Habits: Home exercise routine, Type of exercise: walking, Time (Minutes): 30, Frequency (Times/Week): 6, Weekly Exercise (Minutes/Week): 180, Intensity: Moderate  Goals    . Healthy Lifestyle          Stay hydrated and drink plenty of water Stay active and continue exercising Low carb foods. Lean meats, vegetables.      Fall Risk Fall Risk  07/19/2016 11/21/2014  Falls in the past year? No Yes  Number falls in past yr: - 1  Injury with Fall? - Yes   Depression Screen PHQ 2/9 Scores 07/19/2016 11/21/2014  PHQ - 2 Score 0 0    Cognitive Testing MMSE - Mini Mental State Exam 07/19/2016  Orientation to time 5  Orientation to Place 5  Registration 3  Attention/ Calculation 5  Recall 2    Language- name 2 objects 2  Language- repeat 1  Language- follow 3 step command 3  Language- read & follow direction 1  Write a sentence 1  Copy design 1  Total score 29    Immunization History  Administered Date(s) Administered  . Influenza Split 09/26/2013  . Influenza,inj,Quad PF,36+ Mos 08/28/2014, 09/30/2015  . Pneumococcal Conjugate-13 12/18/2014  . Pneumococcal Polysaccharide-23 11/26/2013  . Tdap 11/21/2009  . Zoster 11/21/2004   Screening Tests Health Maintenance  Topic Date Due  . INFLUENZA VACCINE  06/22/2016  . TETANUS/TDAP  11/22/2019  . COLONOSCOPY  02/20/2024  . ZOSTAVAX  Completed  . PNA vac Low Risk Adult  Completed      Plan:  End of life planning; Advance aging; Advanced directives discussed. Copy of current HCPOA/Living Will requested.  During the course of the visit the patient was educated and counseled about the following appropriate screening and preventive services:   Vaccines to include Pneumoccal, Influenza, Hepatitis B, Td, Zostavax, HCV  Electrocardiogram  Cardiovascular Disease  Colorectal cancer screening  Diabetes screening  Prostate Cancer Screening  Glaucoma screening  Nutrition counseling   Smoking cessation counseling  Patient Instructions (the written plan) was given to the patient.    Varney Biles, LPN  [phone]

## 2016-07-20 NOTE — Progress Notes (Signed)
  I have reviewed the attached  information and agree with above.   Deborra Medina, MD

## 2016-07-20 NOTE — Progress Notes (Signed)
  I have reviewed the above information and agree with above.   Alivea Gladson, MD 

## 2016-07-28 ENCOUNTER — Ambulatory Visit (INDEPENDENT_AMBULATORY_CARE_PROVIDER_SITE_OTHER): Payer: Medicare Other | Admitting: Surgical

## 2016-07-28 DIAGNOSIS — E538 Deficiency of other specified B group vitamins: Secondary | ICD-10-CM

## 2016-07-28 DIAGNOSIS — Z23 Encounter for immunization: Secondary | ICD-10-CM | POA: Diagnosis not present

## 2016-07-28 MED ORDER — CYANOCOBALAMIN 1000 MCG/ML IJ SOLN
1000.0000 ug | Freq: Once | INTRAMUSCULAR | Status: AC
  Administered 2018-02-16: 1000 ug via INTRAMUSCULAR

## 2016-07-28 NOTE — Progress Notes (Signed)
Patient came in today for B12 injection. Injection given in right deltoid. Patient tolerated well.

## 2016-08-31 ENCOUNTER — Ambulatory Visit: Payer: Medicare Other

## 2016-08-31 ENCOUNTER — Ambulatory Visit (INDEPENDENT_AMBULATORY_CARE_PROVIDER_SITE_OTHER): Payer: Medicare Other

## 2016-08-31 DIAGNOSIS — E538 Deficiency of other specified B group vitamins: Secondary | ICD-10-CM

## 2016-08-31 MED ORDER — CYANOCOBALAMIN 1000 MCG/ML IJ SOLN
1000.0000 ug | Freq: Once | INTRAMUSCULAR | Status: AC
  Administered 2016-08-31: 1000 ug via INTRAMUSCULAR

## 2016-08-31 NOTE — Progress Notes (Addendum)
Patient came in for B12 injection.  Received in Right deltoid.  Patient tolerated well.   Reviewed.  Dr Nicki Reaper

## 2016-10-05 ENCOUNTER — Ambulatory Visit (INDEPENDENT_AMBULATORY_CARE_PROVIDER_SITE_OTHER): Payer: Medicare Other

## 2016-10-05 DIAGNOSIS — E538 Deficiency of other specified B group vitamins: Secondary | ICD-10-CM | POA: Diagnosis not present

## 2016-10-05 MED ORDER — CYANOCOBALAMIN 1000 MCG/ML IJ SOLN
1000.0000 ug | Freq: Once | INTRAMUSCULAR | Status: AC
  Administered 2016-10-05: 1000 ug via INTRAMUSCULAR

## 2016-10-05 NOTE — Progress Notes (Addendum)
Patient comes in for B 12 injection .  Injected left deltoid patient tolerated injection well.    Reviewed.  Dr Scott  

## 2016-10-27 DIAGNOSIS — Z961 Presence of intraocular lens: Secondary | ICD-10-CM | POA: Diagnosis not present

## 2016-11-17 ENCOUNTER — Ambulatory Visit (INDEPENDENT_AMBULATORY_CARE_PROVIDER_SITE_OTHER): Payer: Medicare Other | Admitting: Internal Medicine

## 2016-11-17 ENCOUNTER — Encounter: Payer: Self-pay | Admitting: Internal Medicine

## 2016-11-17 VITALS — BP 140/70 | HR 58 | Temp 97.5°F | Resp 16 | Ht 65.0 in | Wt 168.8 lb

## 2016-11-17 DIAGNOSIS — B9789 Other viral agents as the cause of diseases classified elsewhere: Secondary | ICD-10-CM

## 2016-11-17 DIAGNOSIS — E559 Vitamin D deficiency, unspecified: Secondary | ICD-10-CM

## 2016-11-17 DIAGNOSIS — E538 Deficiency of other specified B group vitamins: Secondary | ICD-10-CM

## 2016-11-17 DIAGNOSIS — J069 Acute upper respiratory infection, unspecified: Secondary | ICD-10-CM | POA: Insufficient documentation

## 2016-11-17 DIAGNOSIS — I25810 Atherosclerosis of coronary artery bypass graft(s) without angina pectoris: Secondary | ICD-10-CM

## 2016-11-17 DIAGNOSIS — I2510 Atherosclerotic heart disease of native coronary artery without angina pectoris: Secondary | ICD-10-CM

## 2016-11-17 DIAGNOSIS — K22719 Barrett's esophagus with dysplasia, unspecified: Secondary | ICD-10-CM

## 2016-11-17 DIAGNOSIS — R5383 Other fatigue: Secondary | ICD-10-CM

## 2016-11-17 DIAGNOSIS — R748 Abnormal levels of other serum enzymes: Secondary | ICD-10-CM

## 2016-11-17 LAB — CBC WITH DIFFERENTIAL/PLATELET
BASOS PCT: 0.7 % (ref 0.0–3.0)
Basophils Absolute: 0.1 10*3/uL (ref 0.0–0.1)
EOS PCT: 5.3 % — AB (ref 0.0–5.0)
Eosinophils Absolute: 0.4 10*3/uL (ref 0.0–0.7)
HEMATOCRIT: 43.1 % (ref 39.0–52.0)
HEMOGLOBIN: 14.5 g/dL (ref 13.0–17.0)
LYMPHS PCT: 13.6 % (ref 12.0–46.0)
Lymphs Abs: 1.1 10*3/uL (ref 0.7–4.0)
MCHC: 33.7 g/dL (ref 30.0–36.0)
MCV: 89.7 fl (ref 78.0–100.0)
MONOS PCT: 10.5 % (ref 3.0–12.0)
Monocytes Absolute: 0.8 10*3/uL (ref 0.1–1.0)
NEUTROS ABS: 5.5 10*3/uL (ref 1.4–7.7)
Neutrophils Relative %: 69.9 % (ref 43.0–77.0)
PLATELETS: 189 10*3/uL (ref 150.0–400.0)
RBC: 4.8 Mil/uL (ref 4.22–5.81)
RDW: 14 % (ref 11.5–15.5)
WBC: 7.8 10*3/uL (ref 4.0–10.5)

## 2016-11-17 LAB — COMPREHENSIVE METABOLIC PANEL
ALK PHOS: 142 U/L — AB (ref 39–117)
ALT: 18 U/L (ref 0–53)
AST: 19 U/L (ref 0–37)
Albumin: 4.3 g/dL (ref 3.5–5.2)
BUN: 17 mg/dL (ref 6–23)
CO2: 28 meq/L (ref 19–32)
Calcium: 9.7 mg/dL (ref 8.4–10.5)
Chloride: 106 mEq/L (ref 96–112)
Creatinine, Ser: 1.02 mg/dL (ref 0.40–1.50)
GFR: 75.47 mL/min (ref >60.00)
GLUCOSE: 105 mg/dL — AB (ref 70–99)
POTASSIUM: 5.2 meq/L — AB (ref 3.5–5.1)
SODIUM: 140 meq/L (ref 135–145)
TOTAL PROTEIN: 6.6 g/dL (ref 6.0–8.3)
Total Bilirubin: 0.5 mg/dL (ref 0.2–1.2)

## 2016-11-17 LAB — TSH: TSH: 3.73 u[IU]/mL (ref 0.35–4.50)

## 2016-11-17 LAB — VITAMIN D 25 HYDROXY (VIT D DEFICIENCY, FRACTURES): VITD: 26.28 ng/mL — ABNORMAL LOW (ref 30.00–100.00)

## 2016-11-17 LAB — LIPID PANEL
CHOL/HDL RATIO: 3
Cholesterol: 140 mg/dL (ref 0–200)
HDL: 51.2 mg/dL (ref >39.00)
LDL CALC: 75 mg/dL (ref 0–99)
NONHDL: 89.19
TRIGLYCERIDES: 71 mg/dL (ref 0.0–149.0)
VLDL: 14.2 mg/dL (ref 0.0–40.0)

## 2016-11-17 LAB — VITAMIN B12: VITAMIN B 12: 282 pg/mL (ref 211–911)

## 2016-11-17 NOTE — Assessment & Plan Note (Signed)
URI is most likely viral given the mild HEENT Symptoms  And normal exam.   I have explained that in viral URIS, an antibiotic will not help the symptoms and will increase the risk of developing diarrhea.,  Continue oral and nasal decongestants,prn tylenol 650 mq 8 hrs for aches and pains,  Sinus flushes with Neil Med's rinse,  .  

## 2016-11-17 NOTE — Progress Notes (Signed)
Patient ID: John Burns, male    DOB: [birth year]  Age: 75 y.o. MRN: [id number]  The patient is here for follow up and management of  chronic and acute problems.  he is fasting today  Last b12 injection  was 6 weeks ago.  He has had symptoms of a viral URI with cough since Monday  No fevers or body aches, but a sore throat, runny nose and congestion.  His wife reports decreased memory  He does not snore MMSE 28/30   The risk factors are reflected in the social history.  The roster of all physicians providing medical care to patient - is listed in the Snapshot section of the chart.  Activities of daily living:  The patient is 100% independent in all ADLs: dressing, toileting, feeding as well as independent mobility  Home safety : The patient has smoke detectors in the home. They wear seatbelts.  There are no firearms at home. There is no violence in the home.   There is no risks for hepatitis, STDs or HIV. There is no   history of blood transfusion. They have no travel history to infectious disease endemic areas of the world.  The patient has seen their dentist in the last six month. They have seen their eye doctor in the last year. They admit to slight hearing difficulty with regard to whispered voices and some television programs.  They have deferred audiologic testing in the last year.  They do not  have excessive sun exposure. Discussed the need for sun protection: hats, long sleeves and use of sunscreen if there is significant sun exposure.   Diet: the importance of a healthy diet is discussed. They do have a healthy diet.  The benefits of regular aerobic exercise were discussed. he walks 4 times per week ,  20 minutes.   Depression screen: there are no signs or vegative symptoms of depression- irritability, change in appetite, anhedonia, sadness/tearfullness.  Cognitive assessment: the patient manages all their financial and personal affairs and is actively engaged. They could  relate day,date,year and events; recalled 2/3 objects at 3 minutes; performed clock-face test normally.  The following portions of the patient's history were reviewed and updated as appropriate: allergies, current medications, past family history, past medical history,  past surgical history, past social history  and problem list.  Visual acuity was not assessed per patient preference since she has regular follow up with her ophthalmologist. Hearing and body mass index were assessed and reviewed.   During the course of the visit the patient was educated and counseled about appropriate screening and preventive services including : fall prevention , diabetes screening, nutrition counseling, colorectal cancer screening, and recommended immunizations.    CC: The primary encounter diagnosis was B12 deficiency. 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  History Cyncere has a past medical history of ASCVD (arteriosclerotic cardiovascular disease); Bladder neck obstruction; CAD (coronary artery disease) (2007); COPD (chronic obstructive pulmonary disease) ([place]); Diverticulosis; GERD (gastroesophageal reflux disease); Heart attack; History of hiatal hernia; History of tobacco abuse; HOH (hard of hearing); Hypercholesteremia; Hypertension; Shortness of breath dyspnea; and Stenosis of coronary stent (2013).   He has a past surgical history that includes Coronary stent placement (2007); Angioplasty / stenting femoral; Cardiac catheterization (05/10/12); Colonoscopy (02/05/14); Esophagogastroduodenoscopy (02/05/14); Coronary artery bypass graft (06/04/14); Cataract extraction w/PHACO (  Left, 12/29/2015); and Cataract extraction w/PHACO (Right, 01/26/2016).   His family history includes Cancer (age of  onset: 27) in his father; Heart disease in his mother.He reports that he quit smoking about 23 years ago. His smoking use included Cigarettes. He has a 50.00 pack-year smoking history. He has quit using smokeless tobacco. His smokeless tobacco use included Chew. He reports that he does not drink alcohol or use drugs.  Outpatient Medications Prior to Visit  Medication Sig Dispense Refill  . aspirin 81 MG tablet Take 1 tablet (81 mg total) by mouth daily. 90 tablet 1  . atorvastatin (LIPITOR) 20 MG tablet Take 1 tablet (20 mg total) by mouth daily at 6 PM. 90 tablet 2  . CARTIA XT 180 MG 24 hr capsule TAKE ONE CAPSULE BY MOUTH ONCE DAILY 90 capsule 2  . metoprolol (LOPRESSOR) 50 MG tablet Take 1 tablet (50 mg total) by mouth daily. 90 tablet 1  . omeprazole (PRILOSEC) 20 MG capsule Take 1 capsule (20 mg total) by mouth daily. 90 capsule 1  . sildenafil (VIAGRA) 100 MG tablet Take 1 tablet (100 mg total) by mouth daily as needed for erectile dysfunction. 10 tablet 3  . vitamin E 400 UNIT capsule Take 400 Units by mouth daily.    . ergocalciferol (DRISDOL) 50000 units capsule Take 1 capsule (50,000 Units total) by mouth once a week. 12 capsule 0   Facility-Administered Medications Prior to Visit  Medication Dose Route Frequency Provider Last Rate Last Dose  . cyanocobalamin ((VITAMIN B-12)) injection 1,000 mcg  1,000 mcg Intramuscular Once Leone Haven, MD        Review of Systems   Patient denies headache, fevers, malaise, unintentional weight loss, skin rash, eye pain, dysphagia,  Hemoptysis,  dyspnea, wheezing, chest pain, palpitations, orthopnea, edema, abdominal pain, nausea, melena, diarrhea, constipation, flank pain, dysuria, hematuria, urinary  Frequency, nocturia, numbness, tingling, seizures,  Focal weakness, Loss of consciousness,  Tremor, insomnia, depression, anxiety, and suicidal ideation.      Objective:  BP 140/70   Pulse (!) 58   Temp 97.5 F (36.4 C) (Oral)   Resp 16    Ht _0  (1.651 m)   Wt 168 lb 12 oz (76.5 kg)   BMI 28.08 kg/m   Physical Exam  General appearance: alert, cooperative and appears stated age Ears: normal TM's and external ear canals both ears Throat: lips, mucosa, and tongue normal; teeth and gums normal Neck: no adenopathy, no carotid bruit, supple, symmetrical, trachea midline and thyroid not enlarged, symmetric, no tenderness/mass/nodules Back: symmetric, no curvature. ROM normal. No CVA tenderness. Lungs: clear to auscultation bilaterally Heart: regular rate and rhythm, S1, S2 normal, no murmur, click, rub or gallop Abdomen: soft, non-tender; bowel sounds normal; no masses,  no organomegaly Pulses: 2+ and symmetric Skin: Skin color, texture, turgor normal. No rashes or lesions Lymph nodes: Cervical, supraclavicular, and axillary nodes normal.   Assessment & Plan:   Problem List Items Addressed This Visit    B12 deficiency - Primary    Secondary to prolonged ppi use which is necessary  Due to history of Barrett;s esophagus, has had monthly injections x 6.  checking b12 level  Today   Lab Results  Component Value Date   VITAMINB12 282 11/17/2016          Relevant Orders   Intrinsic Factor Antibodies   CBC with Differential/Platelet (Completed)   B12 (Completed)   Barrett's esophagus    Discussed current controversy regarding prolonged use  of PPI as applying only to  patients without documented Barretts esophagus.   Advised to continue daily use of PPI g iven risk of esophageal CA higher than risk of long term complications         CAD (coronary artery disease) of artery bypass graft    LDL and triglycerides are at goal on current medications. He has no side effects and liver enzymes are normal. No changes today.  Lab Results  Component Value Date   CHOL 140 11/17/2016   HDL 51.20 11/17/2016   LDLCALC 75 11/17/2016   TRIG 71.0 11/17/2016   CHOLHDL 3 11/17/2016         Relevant Orders   Lipid panel  (Completed)   Elevated alkaline phosphatase level    Only his alk phos remains mildly  is elevated,  Will recommend evaluation with ruq ultrasound was normal .   Lab Results  Component Value Date   ALT 18 11/17/2016   AST 19 11/17/2016   ALKPHOS 142 (H) 11/17/2016   BILITOT 0.5 11/17/2016           Vitamin D deficiency   Relevant Orders   VITAMIN D 25 Hydroxy (Vit-D Deficiency, Fractures) (Completed)    Other Visit Diagnoses    Fatigue, unspecified type       Relevant Orders   Comprehensive metabolic panel (Completed)   TSH (Completed)      I have discontinued Mr. Maybury ergocalciferol. I am also having him maintain his vitamin E, sildenafil, omeprazole, aspirin, atorvastatin, CARTIA XT, and metoprolol. We will continue to administer cyanocobalamin.  No orders of the defined types were placed in this encounter.  A total of 25 minutes of face to face time was spent with patient more than half of which was spent in counselling about the above mentioned conditions  and coordination of care  Medications Discontinued During This Encounter  Medication Reason  . ergocalciferol (DRISDOL) 50000 units capsule Completed Course    Follow-up: No Follow-up on file.   Crecencio Mc, MD

## 2016-11-17 NOTE — Assessment & Plan Note (Signed)
LDL and triglycerides are at goal on current medications. He has no side effects and liver enzymes are normal. No changes today.  Lab Results  Component Value Date   CHOL 140 11/17/2016   HDL 51.20 11/17/2016   LDLCALC 75 11/17/2016   TRIG 71.0 11/17/2016   CHOLHDL 3 11/17/2016

## 2016-11-17 NOTE — Assessment & Plan Note (Signed)
Secondary to prolonged ppi use which is necessary  Due to history of Barrett;s esophagus, has had monthly injections x 6.  checking b12 level  Today   Lab Results  Component Value Date   VITAMINB12 282 11/17/2016

## 2016-11-17 NOTE — Assessment & Plan Note (Addendum)
Only his alk phos remains mildly  is elevated,  Will recommend evaluation with ruq ultrasound was normal .   Lab Results  Component Value Date   ALT 18 11/17/2016   AST 19 11/17/2016   ALKPHOS 142 (H) 11/17/2016   BILITOT 0.5 11/17/2016

## 2016-11-17 NOTE — Assessment & Plan Note (Signed)
Discussed current controversy regarding prolonged use of PPI as applying only to  patients without documented Barretts esophagus.   Advised to continue daily use of PPI g iven risk of esophageal CA higher than risk of long term complications

## 2016-11-17 NOTE — Patient Instructions (Addendum)
You have a viral  Syndrome .  The post nasal drip is causing your sore throat and cough .  Flush your sinuses once or  twice daily with  NeilMed's sinus rinse.   Use benadryl 25 mg at bedtime  for the post nasal drip and Afrin nasal spray every 12 hours as needed for the congestion.  Gargle with salt water as needed for the sore throat. Robitussin  for the cough

## 2016-11-18 ENCOUNTER — Other Ambulatory Visit: Payer: Self-pay | Admitting: Internal Medicine

## 2016-11-18 DIAGNOSIS — E875 Hyperkalemia: Secondary | ICD-10-CM

## 2016-11-18 LAB — INTRINSIC FACTOR ANTIBODIES: INTRINSIC FACTOR: NEGATIVE

## 2016-11-18 NOTE — Progress Notes (Signed)
Pre-visit discussion using our clinic review tool. No additional management support is needed unless otherwise documented below in the visit note.  

## 2016-11-23 ENCOUNTER — Telehealth: Payer: Self-pay | Admitting: *Deleted

## 2016-11-23 ENCOUNTER — Telehealth: Payer: Self-pay | Admitting: Internal Medicine

## 2016-11-23 MED ORDER — OMEPRAZOLE 20 MG PO CPDR: 20.0000 mg | DELAYED_RELEASE_CAPSULE | Freq: Every day | ORAL | 1 refills | Status: DC

## 2016-11-23 MED ORDER — METOPROLOL TARTRATE 50 MG PO TABS: 50.0000 mg | ORAL_TABLET | Freq: Every day | ORAL | 1 refills | Status: DC

## 2016-11-23 MED ORDER — DILTIAZEM HCL ER COATED BEADS 180 MG PO CP24: 180.0000 mg | ORAL_CAPSULE | Freq: Every day | ORAL | 2 refills | Status: DC

## 2016-11-23 MED ORDER — ATORVASTATIN CALCIUM 20 MG PO TABS: 20.0000 mg | ORAL_TABLET | Freq: Every day | ORAL | 2 refills | Status: DC

## 2016-11-23 NOTE — Telephone Encounter (Signed)
Pt also needs refill on the rest of the medications.  Call pt @ [phone]. Thank you!

## 2016-11-23 NOTE — Telephone Encounter (Signed)
Patients wife requested lab results  Contact [name] [phone]

## 2016-11-23 NOTE — Telephone Encounter (Signed)
Pt called about needing a refill for atorvastatin (LIPITOR) 20 MG tablet,   Pharmacy is [facility], [place]

## 2016-11-23 NOTE — Telephone Encounter (Signed)
Patient notified of labs and appointment set up.

## 2016-11-23 NOTE — Telephone Encounter (Signed)
Refills sent

## 2016-11-24 ENCOUNTER — Ambulatory Visit (INDEPENDENT_AMBULATORY_CARE_PROVIDER_SITE_OTHER): Payer: Medicare Other

## 2016-11-24 ENCOUNTER — Other Ambulatory Visit (INDEPENDENT_AMBULATORY_CARE_PROVIDER_SITE_OTHER): Payer: Medicare Other

## 2016-11-24 DIAGNOSIS — E875 Hyperkalemia: Secondary | ICD-10-CM

## 2016-11-24 DIAGNOSIS — E538 Deficiency of other specified B group vitamins: Secondary | ICD-10-CM

## 2016-11-24 LAB — BASIC METABOLIC PANEL
BUN: 12 mg/dL (ref 6–23)
CALCIUM: 9.5 mg/dL (ref 8.4–10.5)
CO2: 27 meq/L (ref 19–32)
Chloride: 107 mEq/L (ref 96–112)
Creatinine, Ser: 1.04 mg/dL (ref 0.40–1.50)
GFR: 73.79 mL/min (ref >60.00)
GLUCOSE: 88 mg/dL (ref 70–99)
Potassium: 4.6 mEq/L (ref 3.5–5.1)
Sodium: 140 mEq/L (ref 135–145)

## 2016-11-24 MED ORDER — CYANOCOBALAMIN 1000 MCG/ML IJ SOLN
1000.0000 ug | Freq: Once | INTRAMUSCULAR | Status: AC
  Administered 2016-11-24: 1000 ug via INTRAMUSCULAR

## 2016-11-24 NOTE — Progress Notes (Signed)
  I have reviewed the above information and agree with above.   Teresa Tullo, MD 

## 2016-11-24 NOTE — Progress Notes (Signed)
Patient comes in for B 12 injection.  Injected left deltoid.  Patient tolerated injection well.  

## 2016-11-25 NOTE — Progress Notes (Signed)
Spoke with patient wife and she expressed understanding

## 2016-12-19 ENCOUNTER — Other Ambulatory Visit: Payer: Self-pay | Admitting: Internal Medicine

## 2016-12-20 NOTE — Telephone Encounter (Signed)
Pt needs Viagra refilled but wanted to confirm Qty for dispense. Last refill was for 5 tab and 7 refills. Is this correct?

## 2016-12-20 NOTE — Telephone Encounter (Signed)
I will refill for however many his insurance will pay for.  The number was not set by me.

## 2016-12-28 ENCOUNTER — Ambulatory Visit: Payer: Medicare Other

## 2016-12-29 ENCOUNTER — Ambulatory Visit (INDEPENDENT_AMBULATORY_CARE_PROVIDER_SITE_OTHER): Payer: Medicare Other

## 2016-12-29 DIAGNOSIS — I25810 Atherosclerosis of coronary artery bypass graft(s) without angina pectoris: Secondary | ICD-10-CM | POA: Diagnosis not present

## 2016-12-29 DIAGNOSIS — E538 Deficiency of other specified B group vitamins: Secondary | ICD-10-CM | POA: Diagnosis not present

## 2016-12-29 DIAGNOSIS — I208 Other forms of angina pectoris: Secondary | ICD-10-CM | POA: Diagnosis not present

## 2016-12-29 DIAGNOSIS — R0602 Shortness of breath: Secondary | ICD-10-CM | POA: Diagnosis not present

## 2016-12-29 DIAGNOSIS — I2510 Atherosclerotic heart disease of native coronary artery without angina pectoris: Secondary | ICD-10-CM | POA: Diagnosis not present

## 2016-12-29 DIAGNOSIS — Z9889 Other specified postprocedural states: Secondary | ICD-10-CM | POA: Diagnosis not present

## 2016-12-29 DIAGNOSIS — Z951 Presence of aortocoronary bypass graft: Secondary | ICD-10-CM | POA: Diagnosis not present

## 2016-12-29 DIAGNOSIS — I471 Supraventricular tachycardia: Secondary | ICD-10-CM | POA: Diagnosis not present

## 2016-12-29 DIAGNOSIS — E7800 Pure hypercholesterolemia, unspecified: Secondary | ICD-10-CM | POA: Diagnosis not present

## 2016-12-29 DIAGNOSIS — J410 Simple chronic bronchitis: Secondary | ICD-10-CM | POA: Diagnosis not present

## 2016-12-29 MED ORDER — CYANOCOBALAMIN 1000 MCG/ML IJ SOLN
1000.0000 ug | Freq: Once | INTRAMUSCULAR | Status: AC
  Administered 2016-12-29: 1000 ug via INTRAMUSCULAR

## 2016-12-29 NOTE — Progress Notes (Signed)
  I have reviewed the above information and agree with above.   Teresa Tullo, MD 

## 2016-12-29 NOTE — Progress Notes (Signed)
Patient comes in for B 12 injection.  Injected right deltoid.  Patient tolerated injection well.   

## 2016-12-29 NOTE — Progress Notes (Signed)
  I have reviewed the above information and agree with above.   Lemma Tetro, MD 

## 2017-02-01 ENCOUNTER — Ambulatory Visit: Payer: Medicare Other

## 2017-02-02 ENCOUNTER — Ambulatory Visit (INDEPENDENT_AMBULATORY_CARE_PROVIDER_SITE_OTHER): Payer: Medicare Other | Admitting: *Deleted

## 2017-02-02 DIAGNOSIS — E538 Deficiency of other specified B group vitamins: Secondary | ICD-10-CM | POA: Diagnosis not present

## 2017-02-02 MED ORDER — CYANOCOBALAMIN 1000 MCG/ML IJ SOLN
1000.0000 ug | Freq: Once | INTRAMUSCULAR | Status: AC
  Administered 2017-02-02: 1000 ug via INTRAMUSCULAR

## 2017-02-02 NOTE — Progress Notes (Signed)
Patient presented for B 12 injection to left deltoid , patient showed no signs of discomfort during injection.

## 2017-02-06 NOTE — Progress Notes (Signed)
  I have reviewed the above information and agree with above.   Ceria Suminski, MD 

## 2017-03-08 ENCOUNTER — Ambulatory Visit (INDEPENDENT_AMBULATORY_CARE_PROVIDER_SITE_OTHER): Payer: Medicare Other | Admitting: *Deleted

## 2017-03-08 DIAGNOSIS — E538 Deficiency of other specified B group vitamins: Secondary | ICD-10-CM

## 2017-03-08 MED ORDER — CYANOCOBALAMIN 1000 MCG/ML IJ SOLN
1000.0000 ug | Freq: Once | INTRAMUSCULAR | Status: AC
  Administered 2017-03-08: 1000 ug via INTRAMUSCULAR

## 2017-03-08 NOTE — Progress Notes (Signed)
Patient presented for B 12 injection to left deltoid, patient voiced no concerns or showed any signs of distress during injection. 

## 2017-03-13 NOTE — Progress Notes (Signed)
  I have reviewed the above information and agree with above.   Aleighya Mcanelly, MD 

## 2017-04-07 ENCOUNTER — Ambulatory Visit (INDEPENDENT_AMBULATORY_CARE_PROVIDER_SITE_OTHER): Payer: Medicare Other | Admitting: *Deleted

## 2017-04-07 DIAGNOSIS — E538 Deficiency of other specified B group vitamins: Secondary | ICD-10-CM | POA: Diagnosis not present

## 2017-04-07 MED ORDER — CYANOCOBALAMIN 1000 MCG/ML IJ SOLN
1000.0000 ug | Freq: Once | INTRAMUSCULAR | Status: AC
Start: 2017-04-07 — End: 2017-04-07
  Administered 2017-04-07: 1000 ug via INTRAMUSCULAR

## 2017-04-07 NOTE — Progress Notes (Addendum)
Patient presented for B 12 injection to right deltoid , patient voiced no concern, nor showed any sign of distress during injection.  Reviewed.  Dr Nicki Reaper

## 2017-05-11 ENCOUNTER — Ambulatory Visit (INDEPENDENT_AMBULATORY_CARE_PROVIDER_SITE_OTHER): Payer: Medicare Other

## 2017-05-11 DIAGNOSIS — E538 Deficiency of other specified B group vitamins: Secondary | ICD-10-CM

## 2017-05-11 MED ORDER — CYANOCOBALAMIN 1000 MCG/ML IJ SOLN
1000.0000 ug | Freq: Once | INTRAMUSCULAR | Status: AC
  Administered 2017-05-11: 1000 ug via INTRAMUSCULAR

## 2017-05-11 NOTE — Progress Notes (Signed)
Patient came in for a B12 injection, received in Left deltoid.  Patient tolerated well. b12

## 2017-05-18 ENCOUNTER — Encounter: Payer: Self-pay | Admitting: Internal Medicine

## 2017-05-18 ENCOUNTER — Ambulatory Visit (INDEPENDENT_AMBULATORY_CARE_PROVIDER_SITE_OTHER): Payer: Medicare Other | Admitting: Internal Medicine

## 2017-05-18 VITALS — BP 138/80 | HR 53 | Temp 97.4°F | Resp 15 | Ht 65.0 in | Wt 167.0 lb

## 2017-05-18 DIAGNOSIS — E538 Deficiency of other specified B group vitamins: Secondary | ICD-10-CM | POA: Diagnosis not present

## 2017-05-18 DIAGNOSIS — I471 Supraventricular tachycardia: Secondary | ICD-10-CM | POA: Diagnosis not present

## 2017-05-18 DIAGNOSIS — K22719 Barrett's esophagus with dysplasia, unspecified: Secondary | ICD-10-CM | POA: Diagnosis not present

## 2017-05-18 DIAGNOSIS — R748 Abnormal levels of other serum enzymes: Secondary | ICD-10-CM | POA: Diagnosis not present

## 2017-05-18 DIAGNOSIS — E559 Vitamin D deficiency, unspecified: Secondary | ICD-10-CM | POA: Diagnosis not present

## 2017-05-18 DIAGNOSIS — E7800 Pure hypercholesterolemia, unspecified: Secondary | ICD-10-CM

## 2017-05-18 DIAGNOSIS — I2510 Atherosclerotic heart disease of native coronary artery without angina pectoris: Secondary | ICD-10-CM

## 2017-05-18 LAB — LIPID PANEL
CHOL/HDL RATIO: 3
Cholesterol: 146 mg/dL (ref 0–200)
HDL: 46.8 mg/dL (ref >39.00)
LDL CALC: 88 mg/dL (ref 0–99)
NONHDL: 99.16
TRIGLYCERIDES: 57 mg/dL (ref 0.0–149.0)
VLDL: 11.4 mg/dL (ref 0.0–40.0)

## 2017-05-18 LAB — COMPREHENSIVE METABOLIC PANEL
ALK PHOS: 128 U/L — AB (ref 39–117)
ALT: 14 U/L (ref 0–53)
AST: 18 U/L (ref 0–37)
Albumin: 4.2 g/dL (ref 3.5–5.2)
BUN: 16 mg/dL (ref 6–23)
CO2: 28 mEq/L (ref 19–32)
CREATININE: 1.04 mg/dL (ref 0.40–1.50)
Calcium: 10 mg/dL (ref 8.4–10.5)
Chloride: 108 mEq/L (ref 96–112)
GFR: 73.7 mL/min (ref >60.00)
GLUCOSE: 102 mg/dL — AB (ref 70–99)
POTASSIUM: 5.1 meq/L (ref 3.5–5.1)
SODIUM: 139 meq/L (ref 135–145)
TOTAL PROTEIN: 6.6 g/dL (ref 6.0–8.3)
Total Bilirubin: 0.6 mg/dL (ref 0.2–1.2)

## 2017-05-18 LAB — VITAMIN D 25 HYDROXY (VIT D DEFICIENCY, FRACTURES): VITD: 29.47 ng/mL — ABNORMAL LOW (ref 30.00–100.00)

## 2017-05-18 LAB — TSH: TSH: 5.73 u[IU]/mL — ABNORMAL HIGH (ref 0.35–4.50)

## 2017-05-18 NOTE — Patient Instructions (Addendum)
  The new goals for optimal blood pressure management are 130/80 FOR YOUR AGE. John Burns  Please check your blood pressure a few times at home, and bring the readings AND THE BLOOD PRESSURE MACHINE with you to your nurse visit in 3 weeks  so I can determine if you need a change in medication

## 2017-05-18 NOTE — Progress Notes (Signed)
Subjective:  Patient ID: John Burns, male    DOB: 10-01-1941  Age: 76 y.o. MRN: [id number]  CC: The primary encounter diagnosis was Vitamin D deficiency. Diagnoses of Supraventricular tachycardia ([place]), B12 deficiency, Elevated alkaline phosphatase level, Arteriosclerosis of coronary artery, Pure hypercholesterolemia, and Barrett's esophagus with dysplasia were also pertinent to this visit.  HPI RUSHTON EARLY presents for follow up on hypertension and hyperlipidemia, and B12 deficiency , Patient is taking is medications as prescribed and notes no adverse effects.  Home BP readings have been done about once per week and are  generally < 130/80 .  he is avoiding added salt in his  diet and walking regularly about 3 times per week for exercise.   . Outpatient Medications Prior to Visit  Medication Sig Dispense Refill  . aspirin 81 MG tablet Take 1 tablet (81 mg total) by mouth daily. 90 tablet 1  . atorvastatin (LIPITOR) 20 MG tablet Take 1 tablet (20 mg total) by mouth daily at 6 PM. 90 tablet 2  . diltiazem (CARTIA XT) 180 MG 24 hr capsule Take 1 capsule (180 mg total) by mouth daily. 90 capsule 2  . metoprolol (LOPRESSOR) 50 MG tablet Take 1 tablet (50 mg total) by mouth daily. 90 tablet 1  . omeprazole (PRILOSEC) 20 MG capsule Take 1 capsule (20 mg total) by mouth daily. 90 capsule 1  . VIAGRA 100 MG tablet TAKE ONE TABLET BY MOUTH ONCE DAILY AS NEEDED FOR  ERECTILE  DYSFUNCTION 10 tablet 7  . vitamin E 400 UNIT capsule Take 400 Units by mouth daily.     Facility-Administered Medications Prior to Visit  Medication Dose Route Frequency Provider Last Rate Last Dose  . cyanocobalamin ((VITAMIN B-12)) injection 1,000 mcg  1,000 mcg Intramuscular Once Leone Haven, MD        Review of Systems;  Patient denies headache, fevers, malaise, unintentional weight loss, skin rash, eye pain, sinus congestion and sinus pain, sore throat, dysphagia,  hemoptysis , cough, dyspnea, wheezing,  chest pain, palpitations, orthopnea, edema, abdominal pain, nausea, melena, diarrhea, constipation, flank pain, dysuria, hematuria, urinary  Frequency, nocturia, numbness, tingling, seizures,  Focal weakness, Loss of consciousness,  Tremor, insomnia, depression, anxiety, and suicidal ideation.      Objective:  BP 138/80 (BP Location: Left Arm, Patient Position: Sitting, Cuff Size: Normal)   Pulse (!) 53   Temp 97.4 F (36.3 C) (Oral)   Resp 15   Ht _0  (1.651 m)   Wt 167 lb (75.8 kg)   SpO2 96%   BMI 27.79 kg/m   BP Readings from Last 3 Encounters:  05/18/17 138/80  11/17/16 140/70  07/19/16 138/70    Wt Readings from Last 3 Encounters:  05/18/17 167 lb (75.8 kg)  11/17/16 168 lb 12 oz (76.5 kg)  07/19/16 162 lb 1.9 oz (73.5 kg)    General appearance: alert, cooperative and appears stated age Ears: normal TM's and external ear canals both ears Throat: lips, mucosa, and tongue normal; teeth and gums normal Neck: no adenopathy, no carotid bruit, supple, symmetrical, trachea midline and thyroid not enlarged, symmetric, no tenderness/mass/nodules Back: symmetric, no curvature. ROM normal. No CVA tenderness. Lungs: clear to auscultation bilaterally Heart: regular rate and rhythm, S1, S2 normal, no murmur, click, rub or gallop Abdomen: soft, non-tender; bowel sounds normal; no masses,  no organomegaly Pulses: 2+ and symmetric Skin: Skin color, texture, turgor normal. No rashes or lesions Lymph nodes: Cervical, supraclavicular, and axillary nodes normal.  No results found for: HGBA1C  Lab Results  Component Value Date   CREATININE 1.04 05/18/2017   CREATININE 1.04 11/24/2016   CREATININE 1.02 11/17/2016    Lab Results  Component Value Date   WBC 7.8 11/17/2016   HGB 14.5 11/17/2016   HCT 43.1 11/17/2016   PLT 189.0 11/17/2016   GLUCOSE 102 (H) 05/18/2017   CHOL 146 05/18/2017   TRIG 57.0 05/18/2017   HDL 46.80 05/18/2017   LDLCALC 88 05/18/2017   ALT 14  05/18/2017   AST 18 05/18/2017   NA 139 05/18/2017   K 5.1 05/18/2017   CL 108 05/18/2017   CREATININE 1.04 05/18/2017   BUN 16 05/18/2017   CO2 28 05/18/2017   TSH 5.73 (H) 05/18/2017   PSA 2.33 05/22/2015     Assessment & Plan:   Problem List Items Addressed This Visit    Vitamin D deficiency - Primary   Relevant Orders   VITAMIN D 25 Hydroxy (Vit-D Deficiency, Fractures) (Completed)   Supraventricular tachycardia ([place])    S/p catheter ablation in 2015 at [facility].  normal LV function by stress ECHO 9/16 ([place]).  Rate is well controlled on metoprolol and cardizem.  No changes today       Elevated alkaline phosphatase level    Etiology unclear,  Abdominal ultrasound was normal July 2017 . Alk phos is minimally elevated.    Lab Results  Component Value Date   ALT 14 05/18/2017   AST 18 05/18/2017   ALKPHOS 128 (H) 05/18/2017   BILITOT 0.6 05/18/2017           Relevant Orders   Comprehensive metabolic panel (Completed)   Barrett's esophagus    Unclear when his last EGD was done by [facility].  Continue daily PPI,  Surveillance EGD needed.        B12 deficiency    Secondary to prolonged ppi use which is necessary  due to history of Barrett's esophagus.  Intrinsic factor ab test is negative,  Her prefers to continue monthly injections. Lab Results  Component Value Date   VITAMINB12 282 11/17/2016         Relevant Orders   Intrinsic Factor Antibodies (Completed)   Arteriosclerosis of coronary artery   Relevant Orders   Lipid panel (Completed)    Other Visit Diagnoses    Pure hypercholesterolemia       Relevant Orders   TSH (Completed)      I am having Mr. Fedak maintain his vitamin E, aspirin, atorvastatin, diltiazem, metoprolol tartrate, omeprazole, and VIAGRA. We will continue to administer cyanocobalamin.  No orders of the defined types were placed in this encounter.   There are no discontinued medications.  Follow-up: Return in about 6 months  (around 11/17/2017) for RN VISIT FOR BP CHECK 3 WEEKS .   Crecencio Mc, MD

## 2017-05-19 LAB — INTRINSIC FACTOR ANTIBODIES: INTRINSIC FACTOR: NEGATIVE

## 2017-05-21 NOTE — Assessment & Plan Note (Addendum)
Etiology unclear,  Abdominal ultrasound was normal July 2017 . Alk phos is minimally elevated.    Lab Results  Component Value Date   ALT 14 05/18/2017   AST 18 05/18/2017   ALKPHOS 128 (H) 05/18/2017   BILITOT 0.6 05/18/2017

## 2017-05-21 NOTE — Assessment & Plan Note (Addendum)
S/p catheter ablation in 2015 at [facility].  normal LV function by stress ECHO 9/16 ([place]).  Rate is well controlled on metoprolol and cardizem.  No changes today  

## 2017-05-21 NOTE — Assessment & Plan Note (Signed)
Secondary to prolonged ppi use which is necessary  due to history of Barrett's esophagus.  Intrinsic factor ab test is negative,  Her prefers to continue monthly injections. Lab Results  Component Value Date   [id number] 734 11/17/2016

## 2017-05-21 NOTE — Assessment & Plan Note (Signed)
Unclear when his last EGD was done by [facility].  Continue daily PPI,  Surveillance EGD needed.   

## 2017-05-23 ENCOUNTER — Telehealth: Payer: Self-pay | Admitting: *Deleted

## 2017-05-23 NOTE — Telephone Encounter (Signed)
See result note message 

## 2017-05-23 NOTE — Telephone Encounter (Signed)
Patient requested lab results  Pt contact [phone]

## 2017-05-29 ENCOUNTER — Other Ambulatory Visit: Payer: Self-pay | Admitting: Internal Medicine

## 2017-06-01 DIAGNOSIS — L570 Actinic keratosis: Secondary | ICD-10-CM | POA: Diagnosis not present

## 2017-06-01 DIAGNOSIS — Z872 Personal history of diseases of the skin and subcutaneous tissue: Secondary | ICD-10-CM | POA: Diagnosis not present

## 2017-06-14 ENCOUNTER — Ambulatory Visit (INDEPENDENT_AMBULATORY_CARE_PROVIDER_SITE_OTHER): Payer: Medicare Other | Admitting: *Deleted

## 2017-06-14 ENCOUNTER — Ambulatory Visit: Payer: Medicare Other

## 2017-06-14 VITALS — BP 130/80 | HR 54 | Resp 16

## 2017-06-14 DIAGNOSIS — E538 Deficiency of other specified B group vitamins: Secondary | ICD-10-CM | POA: Diagnosis not present

## 2017-06-14 MED ORDER — CYANOCOBALAMIN 1000 MCG/ML IJ SOLN
1000.0000 ug | Freq: Once | INTRAMUSCULAR | Status: AC
  Administered 2017-06-14: 1000 ug via INTRAMUSCULAR

## 2017-06-14 NOTE — Progress Notes (Signed)
Patient presented for B 12 injection to right deltoid, patient voiced no concerns nor showed any signs of distress during injection.  Patient also in for BP check with home cuff , patient BP readings by nurse today Left arm 140/80 pulse 53 and right arm 138/80 pulse 54 , patient applied home cuff to arm as nurse watched patient applied cuff opposite of how cuff should be applied. Nurse instructed patient to line up arrow on cuff with radial or brachial artery in arm to receive proper reading , patient stated he had always taken by with arrow to back of arm. After correction BP with home cuff 143/78 pulse 55 in left arm. Patient brought list of home readings which may not be accurate due to patient applying home cuff  Inaccurately . IN yellow folder.

## 2017-06-24 ENCOUNTER — Telehealth: Payer: Self-pay | Admitting: Internal Medicine

## 2017-06-24 NOTE — Telephone Encounter (Signed)
Blood pressure readings are fine for the most part .  As long as most are 140/80  Or less,  No need to change anything

## 2017-06-27 NOTE — Telephone Encounter (Signed)
LMTCB

## 2017-06-27 NOTE — Telephone Encounter (Signed)
Attempted to call pt. No answer no voicemail.  

## 2017-06-28 NOTE — Telephone Encounter (Signed)
Spoke with pt's wife(on DPR) and informed her of Dr. Lupita Dawn message below. The pt's wife gave a verbal understanding.

## 2017-07-05 DIAGNOSIS — Z951 Presence of aortocoronary bypass graft: Secondary | ICD-10-CM | POA: Diagnosis not present

## 2017-07-05 DIAGNOSIS — I2510 Atherosclerotic heart disease of native coronary artery without angina pectoris: Secondary | ICD-10-CM | POA: Diagnosis not present

## 2017-07-05 DIAGNOSIS — J449 Chronic obstructive pulmonary disease, unspecified: Secondary | ICD-10-CM | POA: Diagnosis not present

## 2017-07-05 DIAGNOSIS — Z9889 Other specified postprocedural states: Secondary | ICD-10-CM | POA: Diagnosis not present

## 2017-07-05 DIAGNOSIS — R0602 Shortness of breath: Secondary | ICD-10-CM | POA: Diagnosis not present

## 2017-07-05 DIAGNOSIS — E785 Hyperlipidemia, unspecified: Secondary | ICD-10-CM | POA: Diagnosis not present

## 2017-07-05 DIAGNOSIS — I471 Supraventricular tachycardia: Secondary | ICD-10-CM | POA: Diagnosis not present

## 2017-07-19 ENCOUNTER — Ambulatory Visit: Payer: Medicare Other

## 2017-07-19 ENCOUNTER — Ambulatory Visit (INDEPENDENT_AMBULATORY_CARE_PROVIDER_SITE_OTHER): Payer: Medicare Other

## 2017-07-19 VITALS — BP 140/60 | HR 58 | Temp 97.7°F | Resp 14 | Ht 66.0 in | Wt 164.1 lb

## 2017-07-19 DIAGNOSIS — Z1389 Encounter for screening for other disorder: Secondary | ICD-10-CM

## 2017-07-19 DIAGNOSIS — Z1331 Encounter for screening for depression: Secondary | ICD-10-CM

## 2017-07-19 DIAGNOSIS — Z0000 Encounter for general adult medical examination without abnormal findings: Secondary | ICD-10-CM

## 2017-07-19 DIAGNOSIS — E538 Deficiency of other specified B group vitamins: Secondary | ICD-10-CM | POA: Diagnosis not present

## 2017-07-19 MED ORDER — CYANOCOBALAMIN 1000 MCG/ML IJ SOLN
1000.0000 ug | Freq: Once | INTRAMUSCULAR | Status: AC
  Administered 2017-07-19: 1000 ug via INTRAMUSCULAR

## 2017-07-19 MED ORDER — CYANOCOBALAMIN 1000 MCG/ML IJ SOLN: 1000.0000 ug | Freq: Once | INTRAMUSCULAR | 0 refills | Status: DC

## 2017-07-19 NOTE — Patient Instructions (Addendum)
  John Burns , Thank you for taking time to come for your Medicare Wellness Visit. I appreciate your ongoing commitment to your health goals. Please review the following plan we discussed and let me know if I can assist you in the future.   Follow up with Dr. Derrel Nip as needed.    Have a great day!  These are the goals we discussed: Goals    . Healthy Lifestyle          Stay hydrated and drink plenty of water Stay active and continue exercising Low carb foods. Lean meats, vegetables.       This is a list of the screening recommended for you and due dates:  Health Maintenance  Topic Date Due  . Flu Shot  06/22/2017  . Tetanus Vaccine  11/22/2019  . Pneumonia vaccines  Completed

## 2017-07-19 NOTE — Progress Notes (Signed)
Subjective:   John Burns is a 76 y.o. male who presents for Medicare Annual/Subsequent preventive examination.  Review of Systems:  No ROS.  Medicare Wellness Visit. Additional risk factors are reflected in the social history. Cardiac Risk Factors include: advanced age (>20men, >48 women);male gender;hypertension     Objective:    Vitals: BP 140/60 (BP Location: Left Arm, Patient Position: Sitting, Cuff Size: Normal)   Pulse (!) 58   Temp 97.7 F (36.5 C) (Oral)   Resp 14   Ht 5\' 6"  (1.676 m)   Wt 164 lb 1.9 oz (74.4 kg)   SpO2 97%   BMI 26.49 kg/m   Body mass index is 26.49 kg/m.  Tobacco History  Smoking Status  . Former Smoker  . Packs/day: 1.00  . Years: 50.00  . Types: Cigarettes  . Quit date: 11/22/1993  Smokeless Tobacco  . Former Systems developer  . Types: Chew    Comment: used chew to help quit smoking X1 year.     Counseling given: Not Answered   Past Medical History:  Diagnosis Date  . ASCVD (arteriosclerotic cardiovascular disease)   . Bladder neck obstruction   . CAD (coronary artery disease) 2007   s/p PCI to mid LAD  . COPD (chronic obstructive pulmonary disease) ([place])   . Diverticulosis   . GERD (gastroesophageal reflux disease)   . Heart attack ([place])   . History of hiatal hernia   . History of tobacco abuse   . HOH (hard of hearing)   . Hypercholesteremia   . Hypertension   . Shortness of breath dyspnea    on exertion  . Stenosis of coronary stent 2013   Past Surgical History:  Procedure Laterality Date  . ANGIOPLASTY / STENTING FEMORAL    . CARDIAC CATHETERIZATION  05/10/12   patent LAD stent w/ 50% in stent restenosis; 70% stenosis ostium of left circumflex; 75% stenosis mid left circumflex  . CATARACT EXTRACTION W/PHACO Left 12/29/2015   Procedure: CATARACT EXTRACTION PHACO AND INTRAOCULAR LENS PLACEMENT (IOC);  Surgeon: Estill Cotta, MD;  Location: [facility] ORS;  Service: Ophthalmology;  Laterality: Left;  [place]: 01:53.8 AP%: 26.2 CDE:  49.38 Lot # [id number]  . CATARACT EXTRACTION W/PHACO Right 01/26/2016   Procedure: CATARACT EXTRACTION PHACO AND INTRAOCULAR LENS PLACEMENT (IOC);  Surgeon: Estill Cotta, MD;  Location: [facility] ORS;  Service: Ophthalmology;  Laterality: Right;  [place] 01:13 AP% 25.3 CDE 33.95 fluid pack lot # [id number]  . COLONOSCOPY  02/05/14  . CORONARY ARTERY BYPASS GRAFT  06/04/14   x2 [place]  2007  . ESOPHAGOGASTRODUODENOSCOPY  02/05/14   Family History  Problem Relation Age of Onset  . Heart disease Mother   . Cancer Father 68       prostate and colon cancer   History  Sexual Activity  . Sexual activity: Yes    Outpatient Encounter Prescriptions as of 07/19/2017  Medication Sig  . aspirin 81 MG tablet Take 1 tablet (81 mg total) by mouth daily.  Marland Kitchen atorvastatin (LIPITOR) 20 MG tablet Take 1 tablet (20 mg total) by mouth daily at 6 PM.  . Calcium Carb-Cholecalciferol (CALCIUM-VITAMIN D) 500-200 MG-UNIT tablet Take 1 tablet by mouth daily.  Marland Kitchen diltiazem (CARTIA XT) 180 MG 24 hr capsule Take 1 capsule (180 mg total) by mouth daily.  . metoprolol (LOPRESSOR) 50 MG tablet Take 1 tablet (50 mg total) by mouth daily.  Marland Kitchen omeprazole (PRILOSEC) 20 MG capsule TAKE ONE CAPSULE BY MOUTH ONCE  DAILY  . VIAGRA 100 MG tablet TAKE ONE TABLET BY MOUTH ONCE DAILY AS NEEDED FOR  ERECTILE  DYSFUNCTION  . vitamin E 400 UNIT capsule Take 400 Units by mouth daily.  . [DISCONTINUED] cyanocobalamin (,VITAMIN B-12,) 1000 MCG/ML injection Inject 1 mL (1,000 mcg total) into the muscle once.   Facility-Administered Encounter Medications as of 07/19/2017  Medication  . cyanocobalamin ((VITAMIN B-12)) injection 1,000 mcg  . [COMPLETED] cyanocobalamin ((VITAMIN B-12)) injection 1,000 mcg    Activities of Daily Living In your present state of health, do you have any difficulty performing the following activities: 07/19/2017 07/19/2016  Hearing? N N  Vision? N N  Difficulty concentrating or  making decisions? Y Y  Comment Difficulty remembering  Memory delay  Walking or climbing stairs? N N  Dressing or bathing? N N  Doing errands, shopping? N N  Preparing Food and eating ? N N  Using the Toilet? N N  In the past six months, have you accidently leaked urine? N N  Do you have problems with loss of bowel control? N N  Managing your Medications? N N  Managing your Finances? N N  Housekeeping or managing your Housekeeping? N N  Some recent data might be hidden    Patient Care Team: Crecencio Mc, MD as PCP - General (Internal Medicine)   Assessment:    This is a routine wellness examination for [facility]. The goal of the wellness visit is to assist the patient how to close the gaps in care and create a preventative care plan for the patient.   The roster of all physicians providing medical care to patient is listed in the Snapshot section of the chart.  Taking calcium VIT D as appropriate/Osteoporosis risk reviewed.    Safety issues reviewed; Smoke and carbon monoxide detectors in the home. Firearms locked up in the home. Wears seatbelts when driving or riding with others. Patient does wear sunscreen or protective clothing when in direct sunlight. No violence in the home.  Depression- PHQ 2 &9 complete.  No signs/symptoms or verbal communication regarding little pleasure in doing things, feeling down, depressed or hopeless. No changes in sleeping, energy, eating, concentrating.  No thoughts of self harm or harm towards others.  Time spent on this topic is 8 minutes.   Patient is alert, normal appearance, oriented to person/place/and time.  Correctly identified the president of the [place], recall of 1/3 words, and performing simple calculations. Displays appropriate judgement and can read correct time from watch face.   No new identified risk were noted.  No failures at ADL's or IADL's.    BMI- discussed the importance of a healthy diet, water intake and the benefits of  aerobic exercise. Educational material provided.   24 hour diet recall: Breakfast: cereal, fruit, cookies Lunch: vegetables  Dinner: fish, green vegetable  Daily fluid intake: 1 cups of caffeine, 8 cups of water  Dental- dentures  Eye- Visual acuity not assessed per patient preference since they have regular follow up with the ophthalmologist.  Wears corrective lenses.  Sleep patterns- Sleeps 8 hours at night.  Wakes feeling rested.  B12 administered L deltoid, per PCP order. Tolerated well.  Patient Concerns: None at this time. Follow up with PCP as needed.  Exercise Activities and Dietary recommendations Current Exercise Habits: Home exercise routine, Type of exercise: walking, Time (Minutes): 60, Frequency (Times/Week): 6, Weekly Exercise (Minutes/Week): 360, Intensity: Mild  Goals    . Healthy Lifestyle  Stay hydrated and drink plenty of water Stay active and continue exercising Low carb foods. Lean meats, vegetables.      Fall Risk Fall Risk  07/19/2017 11/17/2016 07/19/2016 11/21/2014  Falls in the past year? No No No Yes  Number falls in past yr: - - - 1  Injury with Fall? - - - Yes  Comment - - - Bruising   Depression Screen PHQ 2/9 Scores 07/19/2017 11/17/2016 07/19/2016 11/21/2014  PHQ - 2 Score 0 0 0 0    Cognitive Function MMSE - Mini Mental State Exam 07/19/2016  Orientation to time 5  Orientation to Place 5  Registration 3  Attention/ Calculation 5  Recall 2  Language- name 2 objects 2  Language- repeat 1  Language- follow 3 step command 3  Language- read & follow direction 1  Write a sentence 1  Copy design 1  Total score 29     6CIT Screen 07/19/2017  What Year? 0 points  What month? 0 points  What time? 0 points  Count back from 20 0 points  Months in reverse 0 points  Repeat phrase 0 points  Total Score 0    Immunization History  Administered Date(s) Administered  . Influenza Split 09/26/2013  . Influenza, High Dose  Seasonal PF 07/28/2016  . Influenza,inj,Quad PF,6+ Mos 08/28/2014, 09/30/2015  . Pneumococcal Conjugate-13 12/18/2014  . Pneumococcal Polysaccharide-23 11/26/2013  . Tdap 11/21/2009  . Zoster 11/21/2004   Screening Tests Health Maintenance  Topic Date Due  . INFLUENZA VACCINE  06/22/2017  . TETANUS/TDAP  11/22/2019  . PNA vac Low Risk Adult  Completed      Plan:    End of life planning; Advance aging; Advanced directives discussed. Copy of current HCPOA/Living Will declined.    I have personally reviewed and noted the following in the patient's chart:   . Medical and social history . Use of alcohol, tobacco or illicit drugs  . Current medications and supplements . Functional ability and status . Nutritional status . Physical activity . Advanced directives . List of other physicians . Hospitalizations, surgeries, and ER visits in previous 12 months . Vitals . Screenings to include cognitive, depression, and falls . Referrals and appointments  In addition, I have reviewed and discussed with patient certain preventive protocols, quality metrics, and best practice recommendations. A written personalized care plan for preventive services as well as general preventive health recommendations were provided to patient.     OBrien-Blaney, Lop[place] Karge L, LPN  6/43/3295    I have reviewed the above information and agree with above.   Deborra Medina, MD

## 2017-07-29 DIAGNOSIS — Z951 Presence of aortocoronary bypass graft: Secondary | ICD-10-CM | POA: Diagnosis not present

## 2017-07-29 DIAGNOSIS — Z9889 Other specified postprocedural states: Secondary | ICD-10-CM | POA: Diagnosis not present

## 2017-07-29 DIAGNOSIS — E785 Hyperlipidemia, unspecified: Secondary | ICD-10-CM | POA: Diagnosis not present

## 2017-07-29 DIAGNOSIS — R0602 Shortness of breath: Secondary | ICD-10-CM | POA: Diagnosis not present

## 2017-07-29 DIAGNOSIS — I2510 Atherosclerotic heart disease of native coronary artery without angina pectoris: Secondary | ICD-10-CM | POA: Diagnosis not present

## 2017-08-10 DIAGNOSIS — E785 Hyperlipidemia, unspecified: Secondary | ICD-10-CM | POA: Diagnosis not present

## 2017-08-10 DIAGNOSIS — I25810 Atherosclerosis of coronary artery bypass graft(s) without angina pectoris: Secondary | ICD-10-CM | POA: Diagnosis not present

## 2017-08-10 DIAGNOSIS — I471 Supraventricular tachycardia: Secondary | ICD-10-CM | POA: Diagnosis not present

## 2017-08-10 DIAGNOSIS — J449 Chronic obstructive pulmonary disease, unspecified: Secondary | ICD-10-CM | POA: Diagnosis not present

## 2017-08-10 DIAGNOSIS — I208 Other forms of angina pectoris: Secondary | ICD-10-CM | POA: Diagnosis not present

## 2017-08-10 DIAGNOSIS — R0602 Shortness of breath: Secondary | ICD-10-CM | POA: Diagnosis not present

## 2017-08-10 DIAGNOSIS — Z9889 Other specified postprocedural states: Secondary | ICD-10-CM | POA: Diagnosis not present

## 2017-08-10 DIAGNOSIS — I2510 Atherosclerotic heart disease of native coronary artery without angina pectoris: Secondary | ICD-10-CM | POA: Diagnosis not present

## 2017-08-23 ENCOUNTER — Ambulatory Visit (INDEPENDENT_AMBULATORY_CARE_PROVIDER_SITE_OTHER): Payer: Medicare Other

## 2017-08-23 DIAGNOSIS — E538 Deficiency of other specified B group vitamins: Secondary | ICD-10-CM | POA: Diagnosis not present

## 2017-08-23 DIAGNOSIS — Z23 Encounter for immunization: Secondary | ICD-10-CM

## 2017-08-23 MED ORDER — CYANOCOBALAMIN 1000 MCG/ML IJ SOLN
1000.0000 ug | Freq: Once | INTRAMUSCULAR | Status: AC
  Administered 2017-08-23: 1000 ug via INTRAMUSCULAR

## 2017-08-23 NOTE — Progress Notes (Addendum)
Patient comes in for B 12 injection and flu shot.  B 12 injected into left deltoid.  Flu shot given in right deltoid.  Patient tolerated injections well.     I have reviewed the above information and agree with above.   Deborra Medina, MD

## 2017-08-27 ENCOUNTER — Other Ambulatory Visit: Payer: Self-pay | Admitting: Internal Medicine

## 2017-08-29 ENCOUNTER — Telehealth: Payer: Self-pay | Admitting: *Deleted

## 2017-08-29 NOTE — Telephone Encounter (Signed)
Pt has requested a medication refill for atorvastatin  Pharmacy [place]

## 2017-08-30 MED ORDER — METOPROLOL TARTRATE 50 MG PO TABS: 50.0000 mg | ORAL_TABLET | Freq: Every day | ORAL | 1 refills | Status: DC

## 2017-08-30 NOTE — Telephone Encounter (Signed)
Spoke with pt's wife(on DPR) and informed her that we sent in the atorvastatin yesterday. Pt's wife wanted [place] to refill the the metoprolol as well.

## 2017-09-27 ENCOUNTER — Ambulatory Visit (INDEPENDENT_AMBULATORY_CARE_PROVIDER_SITE_OTHER): Payer: Medicare Other

## 2017-09-27 DIAGNOSIS — E538 Deficiency of other specified B group vitamins: Secondary | ICD-10-CM | POA: Diagnosis not present

## 2017-09-27 MED ORDER — CYANOCOBALAMIN 1000 MCG/ML IJ SOLN
1000.0000 ug | Freq: Once | INTRAMUSCULAR | Status: AC
  Administered 2017-09-27: 1000 ug via INTRAMUSCULAR

## 2017-09-27 NOTE — Progress Notes (Signed)
Patient comes in for B 12 injection,.  Injected right  deltoid.  Patient tolerated injection well.

## 2017-09-30 IMAGING — US US ABDOMEN LIMITED
1 series · 14 of 25 positions shown · non-contrast
Comparison: 06/11/2015.

CLINICAL DATA: Elevated alkaline phosphatase.  Weight loss.

EXAM:
US ABDOMEN LIMITED - RIGHT UPPER QUADRANT

[Series 1: us abdomen limited · 0.19mm/px · 14 of 42 slices shown]
[im 1/42]
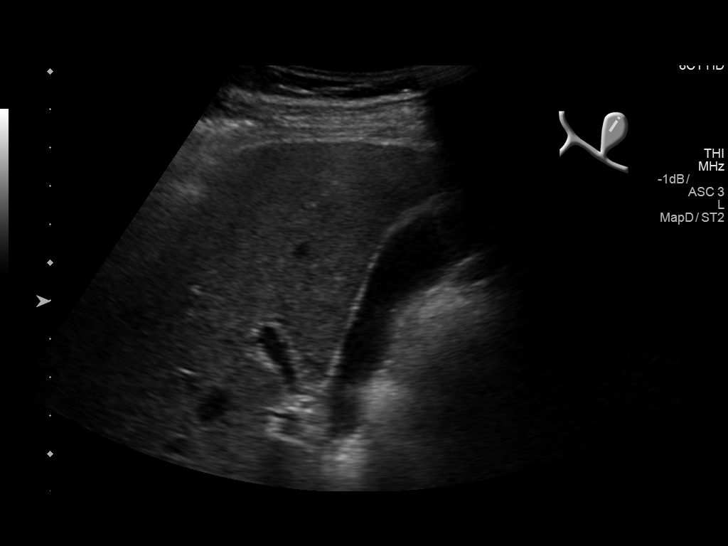
[im 4/42]
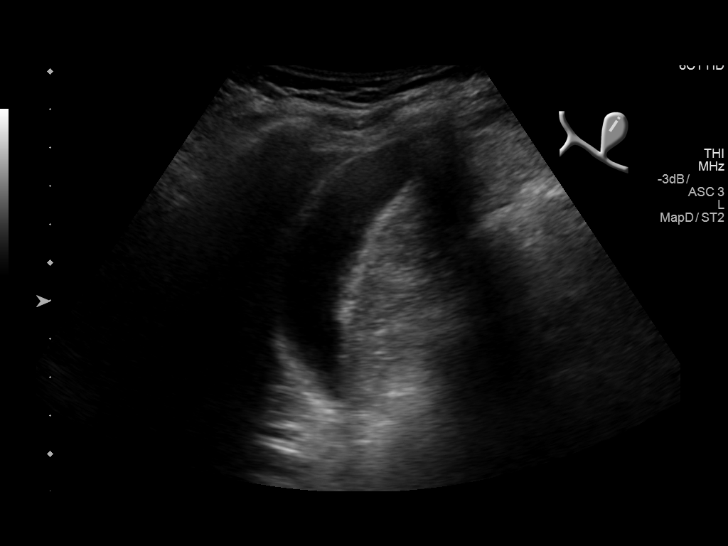
[im 7/42]
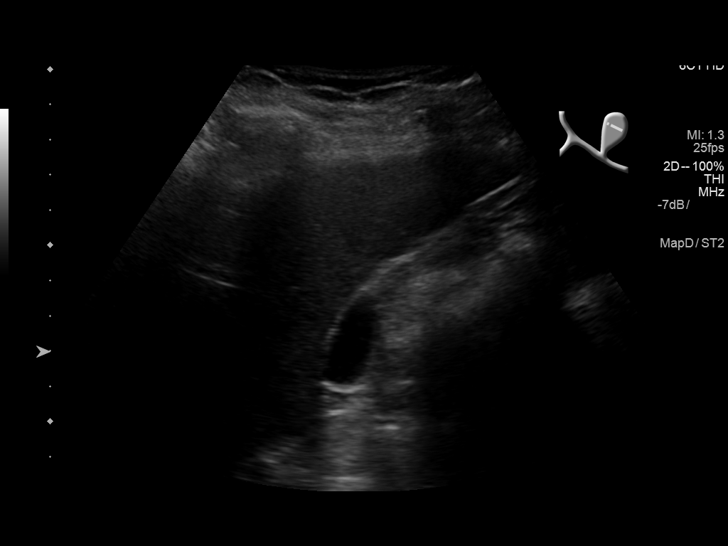
[im 11/42]
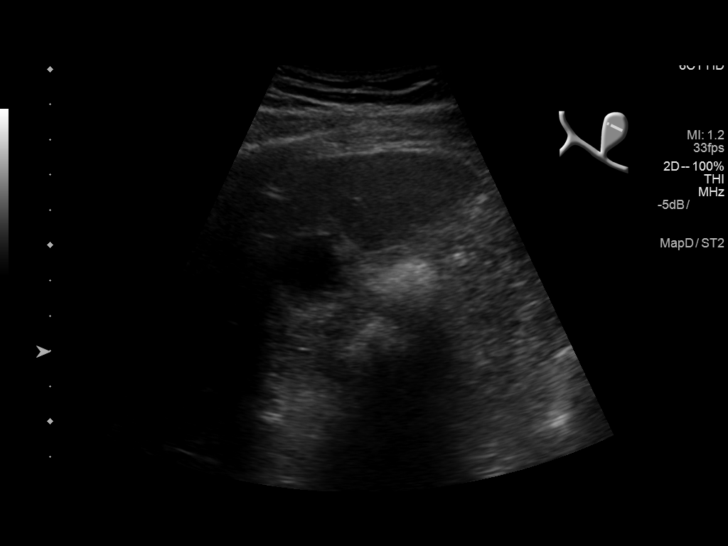
[im 14/42]
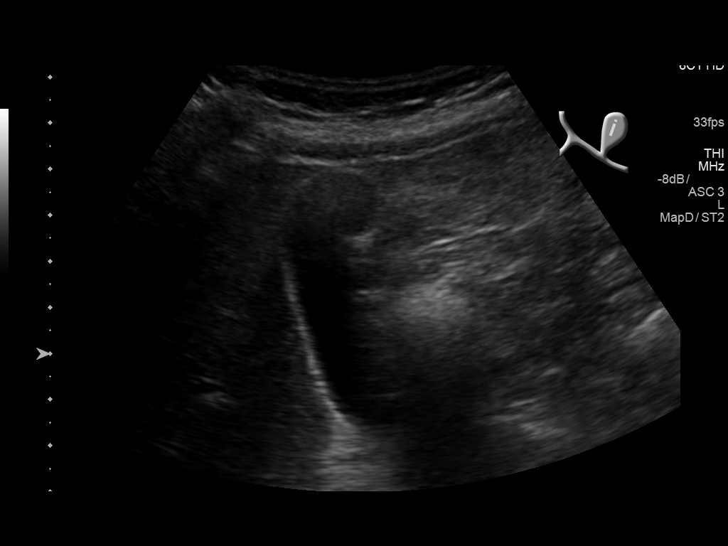
[im 16/42]
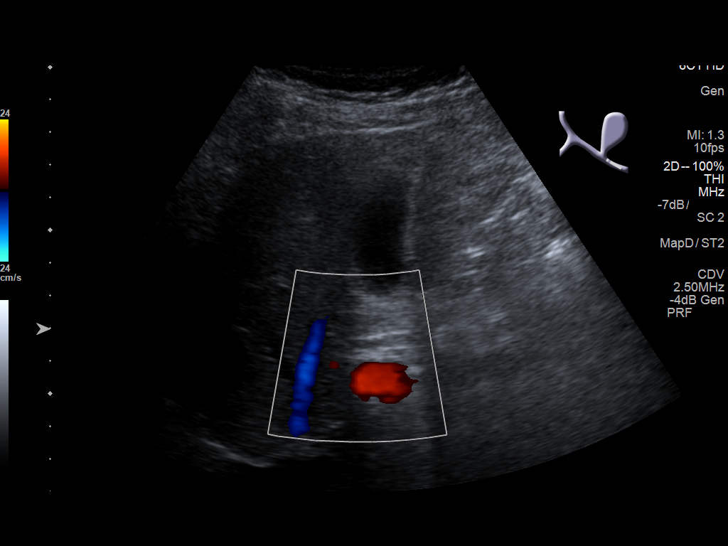
[im 19/42]
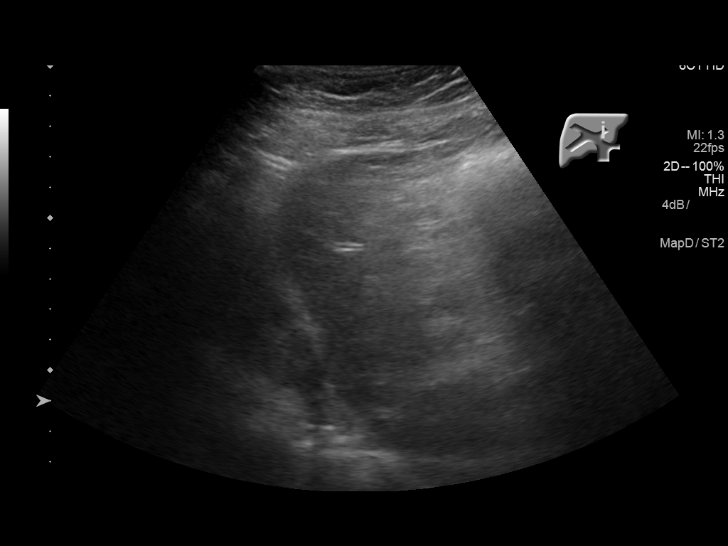
[im 23/42]
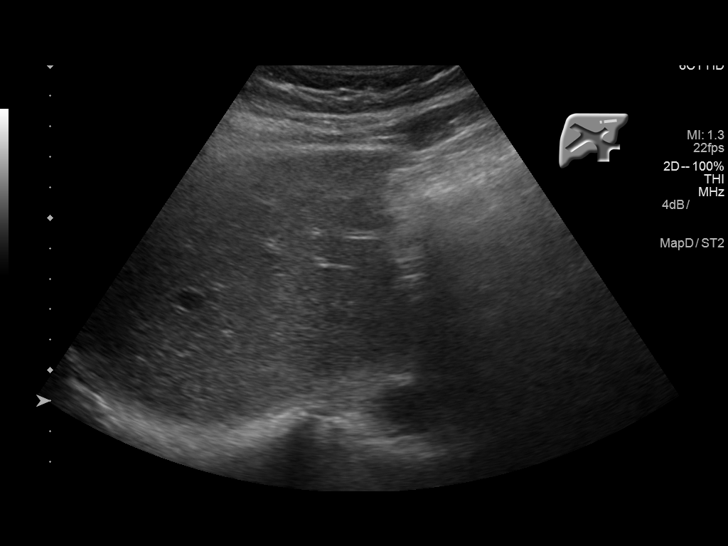
[im 26/42]
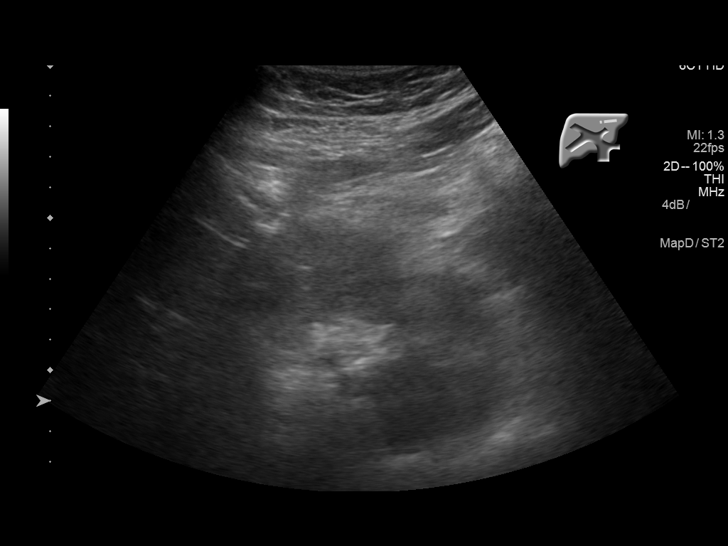
[im 28/42]
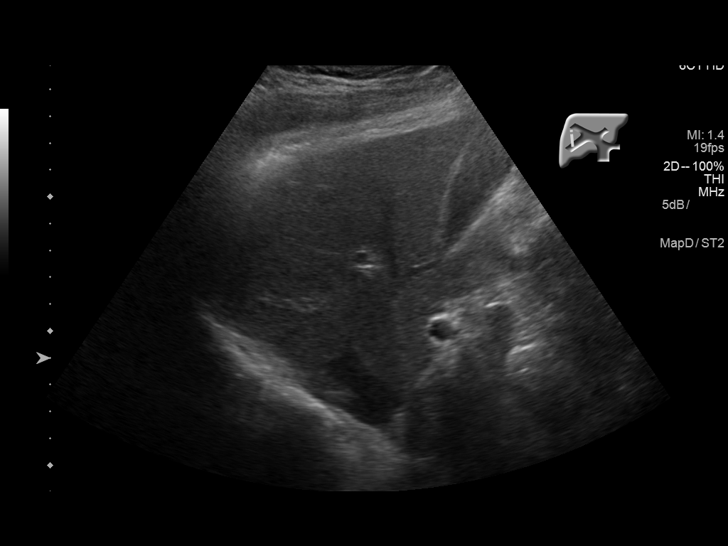
[im 31/42]
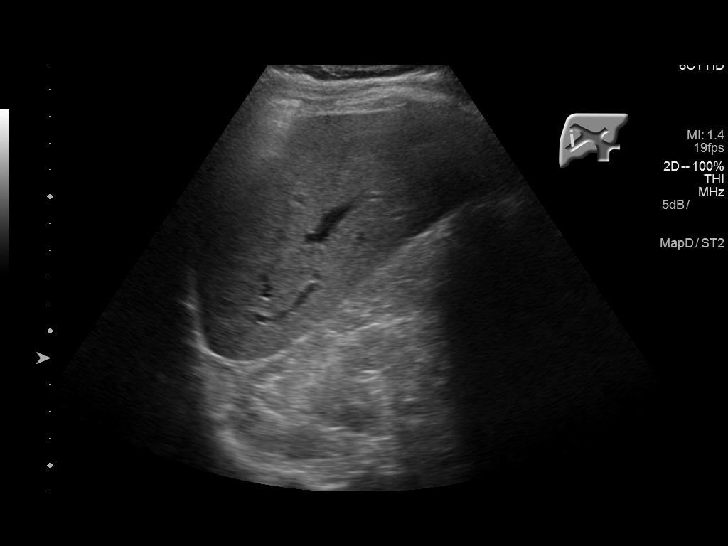
[im 35/42]
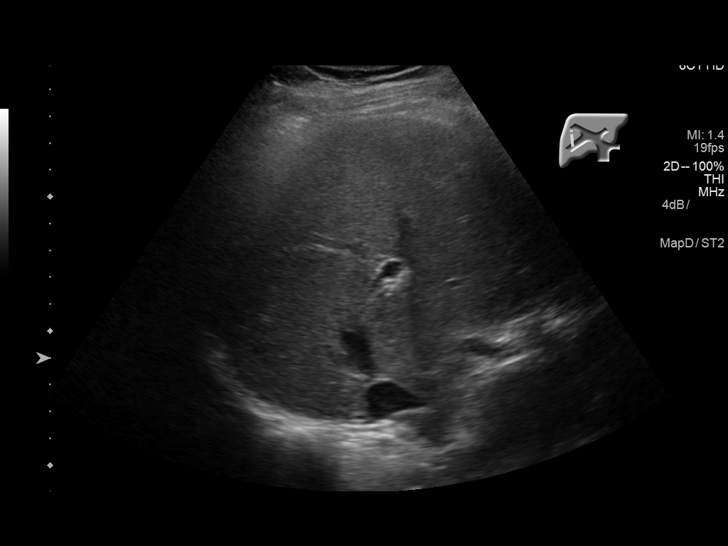
[im 38/42]
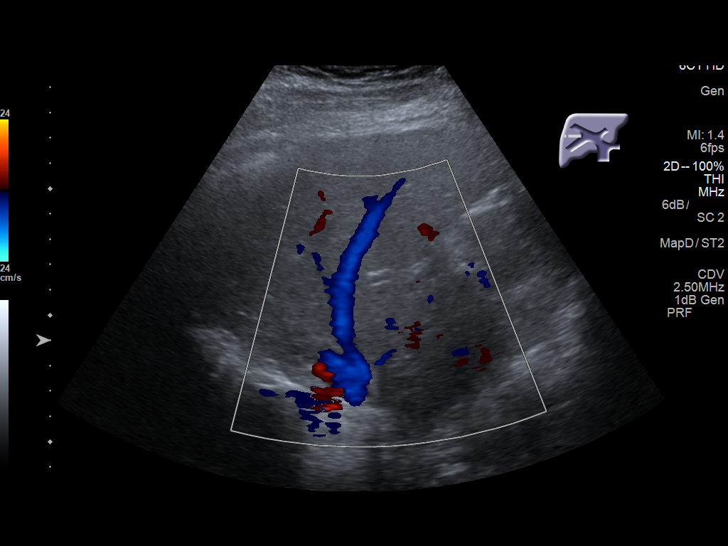
[im 42/42]
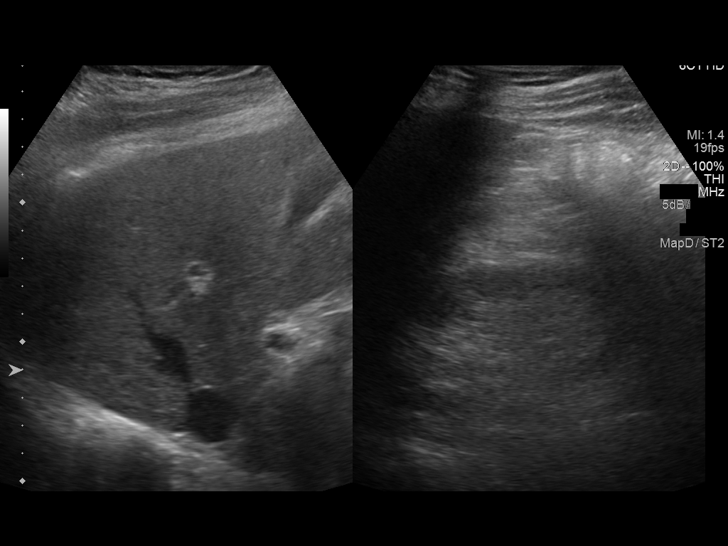

[14 of 25 positions shown; findings below may reference images not displayed]

FINDINGS: Gallbladder:

No gallstones or wall thickening visualized. No sonographic Murphy
sign noted by sonographer.

Common bile duct:

Diameter: 3.7 mm

Liver:

No focal abnormality. Previously identified hepatic cyst not
identified on today's exam. Liver echotexture normal.
IMPRESSION: No acute or focal abnormality. No evidence of gallstones or biliary
distention.Previously identified small hepatic cyst not identified
on today's exam.

## 2017-10-28 ENCOUNTER — Ambulatory Visit: Payer: Medicare Other

## 2017-11-17 ENCOUNTER — Encounter: Payer: Self-pay | Admitting: Internal Medicine

## 2017-11-17 ENCOUNTER — Ambulatory Visit (INDEPENDENT_AMBULATORY_CARE_PROVIDER_SITE_OTHER): Payer: Medicare Other | Admitting: Internal Medicine

## 2017-11-17 VITALS — BP 150/74 | HR 56 | Temp 97.6°F | Resp 15 | Ht 66.0 in | Wt 167.6 lb

## 2017-11-17 DIAGNOSIS — Z1211 Encounter for screening for malignant neoplasm of colon: Secondary | ICD-10-CM

## 2017-11-17 DIAGNOSIS — E538 Deficiency of other specified B group vitamins: Secondary | ICD-10-CM

## 2017-11-17 DIAGNOSIS — I10 Essential (primary) hypertension: Secondary | ICD-10-CM

## 2017-11-17 DIAGNOSIS — I471 Supraventricular tachycardia: Secondary | ICD-10-CM | POA: Diagnosis not present

## 2017-11-17 DIAGNOSIS — E785 Hyperlipidemia, unspecified: Secondary | ICD-10-CM | POA: Diagnosis not present

## 2017-11-17 DIAGNOSIS — I2510 Atherosclerotic heart disease of native coronary artery without angina pectoris: Secondary | ICD-10-CM | POA: Diagnosis not present

## 2017-11-17 DIAGNOSIS — R748 Abnormal levels of other serum enzymes: Secondary | ICD-10-CM

## 2017-11-17 DIAGNOSIS — R5383 Other fatigue: Secondary | ICD-10-CM | POA: Diagnosis not present

## 2017-11-17 DIAGNOSIS — E559 Vitamin D deficiency, unspecified: Secondary | ICD-10-CM | POA: Diagnosis not present

## 2017-11-17 LAB — COMPREHENSIVE METABOLIC PANEL
ALT: 15 U/L (ref 0–53)
AST: 19 U/L (ref 0–37)
Albumin: 4.1 g/dL (ref 3.5–5.2)
Alkaline Phosphatase: 120 U/L — ABNORMAL HIGH (ref 39–117)
BUN: 15 mg/dL (ref 6–23)
CALCIUM: 9.3 mg/dL (ref 8.4–10.5)
CHLORIDE: 108 meq/L (ref 96–112)
CO2: 26 meq/L (ref 19–32)
Creatinine, Ser: 1.07 mg/dL (ref 0.40–1.50)
GFR: 71.22 mL/min (ref >60.00)
Glucose, Bld: 109 mg/dL — ABNORMAL HIGH (ref 70–99)
POTASSIUM: 4.7 meq/L (ref 3.5–5.1)
Sodium: 138 mEq/L (ref 135–145)
Total Bilirubin: 0.5 mg/dL (ref 0.2–1.2)
Total Protein: 6.4 g/dL (ref 6.0–8.3)

## 2017-11-17 LAB — CBC WITH DIFFERENTIAL/PLATELET
BASOS PCT: 0.5 % (ref 0.0–3.0)
Basophils Absolute: 0 10*3/uL (ref 0.0–0.1)
EOS ABS: 0.2 10*3/uL (ref 0.0–0.7)
EOS PCT: 2 % (ref 0.0–5.0)
HCT: 45 % (ref 39.0–52.0)
Hemoglobin: 14.8 g/dL (ref 13.0–17.0)
LYMPHS PCT: 14.1 % (ref 12.0–46.0)
Lymphs Abs: 1.1 10*3/uL (ref 0.7–4.0)
MCHC: 32.9 g/dL (ref 30.0–36.0)
MCV: 94.8 fl (ref 78.0–100.0)
MONO ABS: 0.6 10*3/uL (ref 0.1–1.0)
Monocytes Relative: 8.2 % (ref 3.0–12.0)
NEUTROS ABS: 5.7 10*3/uL (ref 1.4–7.7)
Neutrophils Relative %: 75.2 % (ref 43.0–77.0)
PLATELETS: 174 10*3/uL (ref 150.0–400.0)
RBC: 4.75 Mil/uL (ref 4.22–5.81)
RDW: 13.8 % (ref 11.5–15.5)
WBC: 7.5 10*3/uL (ref 4.0–10.5)

## 2017-11-17 LAB — VITAMIN B12: Vitamin B-12: 385 pg/mL (ref 211–911)

## 2017-11-17 LAB — LIPID PANEL
CHOL/HDL RATIO: 3
CHOLESTEROL: 118 mg/dL (ref 0–200)
HDL: 41.6 mg/dL (ref >39.00)
LDL Cholesterol: 68 mg/dL (ref 0–99)
NonHDL: 76.44
TRIGLYCERIDES: 44 mg/dL (ref 0.0–149.0)
VLDL: 8.8 mg/dL (ref 0.0–40.0)

## 2017-11-17 LAB — VITAMIN D 25 HYDROXY (VIT D DEFICIENCY, FRACTURES): VITD: 30.07 ng/mL (ref 30.00–100.00)

## 2017-11-17 LAB — TSH: TSH: 3.17 u[IU]/mL (ref 0.35–4.50)

## 2017-11-17 MED ORDER — CYANOCOBALAMIN 1000 MCG/ML IJ SOLN
1000.0000 ug | Freq: Once | INTRAMUSCULAR | Status: AC
  Administered 2017-11-17: 1000 ug via INTRAMUSCULAR

## 2017-11-17 NOTE — Patient Instructions (Addendum)
You can take up to 2000 mg of tylenol every single day if needed for back pain .   You can use 4000 mg on a reealy bad day (1000 mg every 6 hours)  But not over and over    your blood pressure is  above the currently recommended goal of  120/70, so I am recommending that you have it checked a few times over the next 2 weeks or bring your home monitor for comparison so that we can use your home readings to adjust your medication    You are starting to develop "the hump" in your upper back from poor posture  Please do the exercises I demonstrated to you today DAILY:  Roll your shoulder frontwards and backwards . 10 times ,  3 sets Add 5 lb weight to each hand when it gets easy  Practice getting up from a chiar WITHOUT USING YOUR HANDS  10 times  3 sets  Stand on one leg for 10 seconds, near the kitchen counter to work on your balance.  Try to use only one finger on the counter 5 times each leg   3 sets

## 2017-11-17 NOTE — Progress Notes (Signed)
Subjective:  Patient ID: John Burns, male    DOB: Apr 08, 1941  Age: 76 y.o. MRN: [id number]  CC: The primary encounter diagnosis was Vitamin D deficiency. Diagnoses of B12 deficiency, Fatigue, unspecified type, Hyperlipidemia LDL goal <70, Arteriosclerosis of coronary artery, Supraventricular tachycardia ([place]), Elevated alkaline phosphatase level, Essential hypertension, and Colon cancer screening were also pertinent to this visit.  HPI READE TREFZ presents for follow up on hypertension, B12 deficiencies and CAD.  Hypertension: patient used to check  blood pressure twice weekly at home, but not in the last month .  Marland Kitchen Patient is following a reduced salt diet most days which is nd is taking medications as prescribed However,  He does admit that he ate country ham yesterday.  Not checking BP lately, machine at home is out of batteries.   Needs b12 injection. Has been feeling tired and his last injection 7 weeks ago  Feeling more tired than usual.  sleeping well,  Has  nocturia x 2 not new . Wakes up refreshed .  Denies shortness of breath . Has aching in upper back, managed with tylenol .  No radiculopathy .   Outpatient Medications Prior to Visit  Medication Sig Dispense Refill  . aspirin 81 MG tablet Take 1 tablet (81 mg total) by mouth daily. 90 tablet 1  . atorvastatin (LIPITOR) 20 MG tablet TAKE ONE TABLET BY MOUTH ONCE DAILY AT 6 PM 90 tablet 2  . Calcium Carb-Cholecalciferol (CALCIUM-VITAMIN D) 500-200 MG-UNIT tablet Take 1 tablet by mouth daily.    Marland Kitchen CARTIA XT 180 MG 24 hr capsule TAKE ONE CAPSULE BY MOUTH ONCE DAILY 90 capsule 2  . metoprolol tartrate (LOPRESSOR) 50 MG tablet Take 1 tablet (50 mg total) by mouth daily. 90 tablet 1  . omeprazole (PRILOSEC) 20 MG capsule TAKE ONE CAPSULE BY MOUTH ONCE DAILY 90 capsule 1  . VIAGRA 100 MG tablet TAKE ONE TABLET BY MOUTH ONCE DAILY AS NEEDED FOR  ERECTILE  DYSFUNCTION 10 tablet 7  . vitamin E 400 UNIT capsule Take 400 Units by  mouth daily.     Facility-Administered Medications Prior to Visit  Medication Dose Route Frequency Provider Last Rate Last Dose  . cyanocobalamin ((VITAMIN B-12)) injection 1,000 mcg  1,000 mcg Intramuscular Once Leone Haven, MD        Review of Systems;  Patient denies headache, fevers, malaise, unintentional weight loss, skin rash, eye pain, sinus congestion and sinus pain, sore throat, dysphagia,  hemoptysis , cough, dyspnea, wheezing, chest pain, palpitations, orthopnea, edema, abdominal pain, nausea, melena, diarrhea, constipation, flank pain, dysuria, hematuria, urinary  Frequency, nocturia, numbness, tingling, seizures,  Focal weakness, Loss of consciousness,  Tremor, insomnia, depression, anxiety, and suicidal ideation.      Objective:  BP (!) 150/74 (BP Location: Left Arm, Patient Position: Sitting, Cuff Size: Normal)   Pulse (!) 56   Temp 97.6 F (36.4 C) (Oral)   Resp 15   Ht '5\' 6"'$  (1.676 m)   Wt 167 lb 9.6 oz (76 kg)   SpO2 98%   BMI 27.05 kg/m   BP Readings from Last 3 Encounters:  11/17/17 (!) 150/74  07/19/17 140/60  06/14/17 130/80    Wt Readings from Last 3 Encounters:  11/17/17 167 lb 9.6 oz (76 kg)  07/19/17 164 lb 1.9 oz (74.4 kg)  05/18/17 167 lb (75.8 kg)    General appearance: alert, cooperative and appears stated age Ears: normal TM's and external ear canals both ears Throat: lips,  mucosa, and tongue normal; teeth and gums normal Neck: no adenopathy, no carotid bruit, supple, symmetrical, trachea midline and thyroid not enlarged, symmetric, no tenderness/mass/nodules Back: symmetric, no curvature. ROM normal. No CVA tenderness. Lungs: clear to auscultation bilaterally Heart: regular rate and rhythm, S1, S2 normal, no murmur, click, rub or gallop Abdomen: soft, non-tender; bowel sounds normal; no masses,  no organomegaly Pulses: 2+ and symmetric Skin: Skin color, texture, turgor normal. No rashes or lesions Lymph nodes: Cervical,  supraclavicular, and axillary nodes normal.  No results found for: HGBA1C  Lab Results  Component Value Date   CREATININE 1.07 11/17/2017   CREATININE 1.04 05/18/2017   CREATININE 1.04 11/24/2016    Lab Results  Component Value Date   WBC 7.5 11/17/2017   HGB 14.8 11/17/2017   HCT 45.0 11/17/2017   PLT 174.0 11/17/2017   GLUCOSE 109 (H) 11/17/2017   CHOL 118 11/17/2017   TRIG 44.0 11/17/2017   HDL 41.60 11/17/2017   LDLCALC 68 11/17/2017   ALT 15 11/17/2017   AST 19 11/17/2017   NA 138 11/17/2017   K 4.7 11/17/2017   CL 108 11/17/2017   CREATININE 1.07 11/17/2017   BUN 15 11/17/2017   CO2 26 11/17/2017   TSH 3.17 11/17/2017   PSA 2.33 05/22/2015    US Abdomen Limited Ruq  Result Date: 05/27/2016 CLINICAL DATA:  Elevated alkaline phosphatase.  Weight loss. EXAM: US ABDOMEN LIMITED - RIGHT UPPER QUADRANT COMPARISON:  06/11/2015. FINDINGS: Gallbladder: No gallstones or wall thickening visualized. No sonographic Murphy sign noted by sonographer. Common bile duct: Diameter: 3.7 mm Liver: No focal abnormality. Previously identified hepatic cyst not identified on today's exam. Liver echotexture normal. IMPRESSION: No acute or focal abnormality. No evidence of gallstones or biliary distention.Previously identified small hepatic cyst not identified on today's exam. Electronically Signed   By: Marcello Moores  Register   On: 05/27/2016 09:32    Assessment & Plan:   Problem List Items Addressed This Visit    Vitamin D deficiency - Primary   Relevant Orders   VITAMIN D 25 Hydroxy (Vit-D Deficiency, Fractures) (Completed)   Arteriosclerosis of coronary artery    LDL and triglycerides are at goal on current medications. He is asymptomatic,  Is tolerating his medications without  side effects and liver enzymes are normal except for chronic mild elevation of alk phos . No changes today.  Lab Results  Component Value Date   CHOL 118 11/17/2017   HDL 41.60 11/17/2017   LDLCALC 68 11/17/2017     TRIG 44.0 11/17/2017   CHOLHDL 3 11/17/2017   Lab Results  Component Value Date   ALT 15 11/17/2017   AST 19 11/17/2017   ALKPHOS 120 (H) 11/17/2017   BILITOT 0.5 11/17/2017         B12 deficiency    Secondary to prolonged ppi use which is necessary  due to history of Barrett's esophagus.  Intrinsic factor ab test is negative,  Her prefers to continue monthly injections but missed his last one and feels more tired than usual.  Repeat level has dropped slightly,  Ut not below 300. Resume monthly injections  Lab Results  Component Value Date   VITAMINB12 385 11/17/2017         Relevant Medications   cyanocobalamin ((VITAMIN B-12)) injection 1,000 mcg (Completed)   Other Relevant Orders   Vitamin B12 (Completed)   Colon cancer screening    5 year follow up is due in 2020 per chart Gustavo Lah)  Elevated alkaline phosphatase level    Etiology unclear,  Abdominal ultrasound was normal July 2017 . Alk phos is minimally elevated and stable no further workup at this time.  Continue statin therapy for known CAD .    Lab Results  Component Value Date   ALT 15 11/17/2017   AST 19 11/17/2017   ALKPHOS 120 (H) 11/17/2017   BILITOT 0.5 11/17/2017           Essential hypertension    he reports compliance with medication regimen  but has an elevated reading today in office.  He has been asked to check his BP at  Home and  submit readings for evaluation. Renal function will be checked today  Lab Results  Component Value Date   CREATININE 1.07 11/17/2017   No results found for: Derl Barrow       Supraventricular tachycardia ([place])    S/p catheter ablation in 2015 [facility].  normal LV function by stress ECHO 9/16 ([facility]).  Rate is well controlled on metoprolol and cardizem.  No changes today        Other Visit Diagnoses    Fatigue, unspecified type       Relevant Orders   Comprehensive metabolic panel (Completed)   TSH (Completed)   CBC with  Differential/Platelet (Completed)   Hyperlipidemia LDL goal <70       Relevant Orders   Lipid panel (Completed)     A total of 40 minutes was spent with patient more than half of which was spent in counseling patient on the above mentioned issues , reviewing and explaining recent labs and imaging studies done, and coordination of care.  I am having Jerrian L. Carlton maintain his vitamin E, aspirin, VIAGRA, omeprazole, calcium-vitamin D, CARTIA XT, atorvastatin, and metoprolol tartrate. We administered cyanocobalamin. We will continue to administer cyanocobalamin.  Meds ordered this encounter  Medications  . cyanocobalamin ((VITAMIN B-12)) injection 1,000 mcg    There are no discontinued medications.  Follow-up: Return in about 6 months (around 05/18/2018).   Crecencio Mc, MD

## 2017-11-19 ENCOUNTER — Encounter: Payer: Self-pay | Admitting: Internal Medicine

## 2017-11-19 DIAGNOSIS — I10 Essential (primary) hypertension: Secondary | ICD-10-CM | POA: Insufficient documentation

## 2017-11-19 NOTE — Assessment & Plan Note (Signed)
Secondary to prolonged ppi use which is necessary  due to history of Barrett's esophagus.  Intrinsic factor ab test is negative,  Her prefers to continue monthly injections but missed his last one and feels more tired than usual.  Repeat level has dropped slightly,  Ut not below 300. Resume monthly injections  Lab Results  Component Value Date   VITAMINB12 385 11/17/2017

## 2017-11-19 NOTE — Assessment & Plan Note (Signed)
he reports compliance with medication regimen  but has an elevated reading today in office.  He has been asked to check his BP at  Home and  submit readings for evaluation. Renal function will be checked today  Lab Results  Component Value Date   CREATININE 1.07 11/17/2017   No results found for: Derl Barrow

## 2017-11-19 NOTE — Assessment & Plan Note (Signed)
S/p catheter ablation in 2015 at [facility].  normal LV function by stress ECHO 9/16 ([facility]).  Rate is well controlled on metoprolol and cardizem.  No changes today

## 2017-11-19 NOTE — Assessment & Plan Note (Addendum)
5 year follow up is due in 2020 per chart Gustavo Lah)

## 2017-11-19 NOTE — Assessment & Plan Note (Signed)
LDL and triglycerides are at goal on current medications. He is asymptomatic,  Is tolerating his medications without  side effects and liver enzymes are normal except for chronic mild elevation of alk phos . No changes today.  Lab Results  Component Value Date   CHOL 118 11/17/2017   HDL 41.60 11/17/2017   LDLCALC 68 11/17/2017   TRIG 44.0 11/17/2017   CHOLHDL 3 11/17/2017   Lab Results  Component Value Date   ALT 15 11/17/2017   AST 19 11/17/2017   ALKPHOS 120 (H) 11/17/2017   BILITOT 0.5 11/17/2017

## 2017-11-19 NOTE — Assessment & Plan Note (Signed)
Etiology unclear,  Abdominal ultrasound was normal July 2017 . Alk phos is minimally elevated and stable no further workup at this time.  Continue statin therapy for known CAD .    Lab Results  Component Value Date   ALT 15 11/17/2017   AST 19 11/17/2017   ALKPHOS 120 (H) 11/17/2017   BILITOT 0.5 11/17/2017

## 2017-11-28 ENCOUNTER — Telehealth: Payer: Self-pay | Admitting: Internal Medicine

## 2017-11-28 ENCOUNTER — Other Ambulatory Visit: Payer: Self-pay | Admitting: *Deleted

## 2017-11-28 MED ORDER — OMEPRAZOLE 20 MG PO CPDR: 20.0000 mg | DELAYED_RELEASE_CAPSULE | Freq: Every day | ORAL | 1 refills | Status: DC

## 2017-11-28 MED ORDER — ATORVASTATIN CALCIUM 20 MG PO TABS: ORAL_TABLET | ORAL | 2 refills | Status: DC

## 2017-11-28 MED ORDER — DILTIAZEM HCL ER COATED BEADS 180 MG PO CP24: 180.0000 mg | ORAL_CAPSULE | Freq: Every day | ORAL | 2 refills | Status: DC

## 2017-11-28 MED ORDER — METOPROLOL TARTRATE 50 MG PO TABS: 50.0000 mg | ORAL_TABLET | Freq: Every day | ORAL | 1 refills | Status: DC

## 2017-11-28 NOTE — Telephone Encounter (Signed)
Copied from [place] [phone]. Topic: Quick Communication - See Telephone Encounter >> Nov 28, 2017  9:48 AM Burnis Medin, NT wrote: CRM for notification. See Telephone encounter for: Pt is calling to get a refill for CARTIA XT 180 MG 24 hr capsule, omeprazole (PRILOSEC) 20 MG capsule, atorvastatin (LIPITOR) 20 MG tablet, metoprolol tartrate (LOPRESSOR) 50 MG tablet. Pt uses [place] [place].            11/28/17.

## 2017-12-07 ENCOUNTER — Telehealth: Payer: Self-pay | Admitting: Internal Medicine

## 2017-12-07 DIAGNOSIS — H348312 Tributary (branch) retinal vein occlusion, right eye, stable: Secondary | ICD-10-CM | POA: Diagnosis not present

## 2017-12-07 NOTE — Telephone Encounter (Signed)
Results have been faxed electronically to patient.

## 2017-12-07 NOTE — Telephone Encounter (Signed)
Copied from [place] [phone]. Topic: Quick Communication - See Telephone Encounter >> Dec 07, 2017  3:13 PM Vernona Rieger wrote: CRM for notification. See Telephone encounter for:   12/07/17.  John Burns @ [facility] would like to have a copy of his CBC's faxed to the office. Fax number [phone]

## 2017-12-21 ENCOUNTER — Ambulatory Visit (INDEPENDENT_AMBULATORY_CARE_PROVIDER_SITE_OTHER): Payer: Medicare Other | Admitting: *Deleted

## 2017-12-21 DIAGNOSIS — E538 Deficiency of other specified B group vitamins: Secondary | ICD-10-CM

## 2017-12-21 MED ORDER — CYANOCOBALAMIN 1000 MCG/ML IJ SOLN
1000.0000 ug | Freq: Once | INTRAMUSCULAR | Status: AC
Start: 2017-12-21 — End: 2017-12-21
  Administered 2017-12-21: 1000 ug via INTRAMUSCULAR

## 2017-12-21 NOTE — Progress Notes (Signed)
Patient presented for B 12 injection to left deltoid, patient voiced no concerns nor showed any signs of distress during injection. 

## 2017-12-25 ENCOUNTER — Other Ambulatory Visit: Payer: Self-pay | Admitting: Internal Medicine

## 2017-12-25 NOTE — Progress Notes (Signed)
  I have reviewed the above information and agree with above.   Desirai Traxler, MD 

## 2018-01-09 DIAGNOSIS — H348312 Tributary (branch) retinal vein occlusion, right eye, stable: Secondary | ICD-10-CM | POA: Diagnosis not present

## 2018-01-19 ENCOUNTER — Ambulatory Visit (INDEPENDENT_AMBULATORY_CARE_PROVIDER_SITE_OTHER): Payer: Medicare Other

## 2018-01-19 DIAGNOSIS — E538 Deficiency of other specified B group vitamins: Secondary | ICD-10-CM | POA: Diagnosis not present

## 2018-01-19 MED ORDER — CYANOCOBALAMIN 1000 MCG/ML IJ SOLN
1000.0000 ug | Freq: Once | INTRAMUSCULAR | Status: AC
  Administered 2018-01-19: 1000 ug via INTRAMUSCULAR

## 2018-01-19 NOTE — Progress Notes (Signed)
Patient came into office for B12 inj. Tolerated well. No complaints or concerns.

## 2018-02-06 DIAGNOSIS — Z9889 Other specified postprocedural states: Secondary | ICD-10-CM | POA: Diagnosis not present

## 2018-02-06 DIAGNOSIS — E785 Hyperlipidemia, unspecified: Secondary | ICD-10-CM | POA: Diagnosis not present

## 2018-02-06 DIAGNOSIS — R0602 Shortness of breath: Secondary | ICD-10-CM | POA: Diagnosis not present

## 2018-02-06 DIAGNOSIS — J449 Chronic obstructive pulmonary disease, unspecified: Secondary | ICD-10-CM | POA: Diagnosis not present

## 2018-02-06 DIAGNOSIS — R079 Chest pain, unspecified: Secondary | ICD-10-CM | POA: Diagnosis not present

## 2018-02-06 DIAGNOSIS — Z951 Presence of aortocoronary bypass graft: Secondary | ICD-10-CM | POA: Diagnosis not present

## 2018-02-06 DIAGNOSIS — I25810 Atherosclerosis of coronary artery bypass graft(s) without angina pectoris: Secondary | ICD-10-CM | POA: Diagnosis not present

## 2018-02-06 DIAGNOSIS — I2510 Atherosclerotic heart disease of native coronary artery without angina pectoris: Secondary | ICD-10-CM | POA: Diagnosis not present

## 2018-02-16 ENCOUNTER — Ambulatory Visit (INDEPENDENT_AMBULATORY_CARE_PROVIDER_SITE_OTHER): Payer: Medicare Other

## 2018-02-16 DIAGNOSIS — E538 Deficiency of other specified B group vitamins: Secondary | ICD-10-CM

## 2018-02-16 NOTE — Progress Notes (Signed)
Patient comes in today for a vitamin B 12 injection. Administered in left deltoid IM. Patient tolerated well. 

## 2018-03-21 ENCOUNTER — Ambulatory Visit (INDEPENDENT_AMBULATORY_CARE_PROVIDER_SITE_OTHER): Payer: Medicare Other

## 2018-03-21 DIAGNOSIS — E538 Deficiency of other specified B group vitamins: Secondary | ICD-10-CM

## 2018-03-21 MED ORDER — CYANOCOBALAMIN 1000 MCG/ML IJ SOLN
1000.0000 ug | Freq: Once | INTRAMUSCULAR | Status: AC
  Administered 2018-03-21: 1000 ug via INTRAMUSCULAR

## 2018-03-21 NOTE — Progress Notes (Signed)
B 12 injection given in right deltoid. Patient tolerated well 

## 2018-04-10 DIAGNOSIS — H348312 Tributary (branch) retinal vein occlusion, right eye, stable: Secondary | ICD-10-CM | POA: Diagnosis not present

## 2018-04-20 ENCOUNTER — Ambulatory Visit (INDEPENDENT_AMBULATORY_CARE_PROVIDER_SITE_OTHER): Payer: Medicare Other | Admitting: *Deleted

## 2018-04-20 DIAGNOSIS — E538 Deficiency of other specified B group vitamins: Secondary | ICD-10-CM

## 2018-04-20 MED ORDER — CYANOCOBALAMIN 1000 MCG/ML IJ SOLN
1000.0000 ug | Freq: Once | INTRAMUSCULAR | Status: AC
  Administered 2018-04-20: 1000 ug via INTRAMUSCULAR

## 2018-04-20 NOTE — Progress Notes (Signed)
Patient presented for B 12 injection to left deltoid, patient voiced no concerns nor showed any signs of distress during injection. 

## 2018-05-17 DIAGNOSIS — L570 Actinic keratosis: Secondary | ICD-10-CM | POA: Diagnosis not present

## 2018-05-17 DIAGNOSIS — L578 Other skin changes due to chronic exposure to nonionizing radiation: Secondary | ICD-10-CM | POA: Diagnosis not present

## 2018-05-17 DIAGNOSIS — Z1283 Encounter for screening for malignant neoplasm of skin: Secondary | ICD-10-CM | POA: Diagnosis not present

## 2018-05-18 ENCOUNTER — Ambulatory Visit (INDEPENDENT_AMBULATORY_CARE_PROVIDER_SITE_OTHER): Payer: Medicare Other

## 2018-05-18 ENCOUNTER — Encounter: Payer: Self-pay | Admitting: Internal Medicine

## 2018-05-18 ENCOUNTER — Ambulatory Visit (INDEPENDENT_AMBULATORY_CARE_PROVIDER_SITE_OTHER): Payer: Medicare Other | Admitting: Internal Medicine

## 2018-05-18 VITALS — BP 130/68 | HR 52 | Temp 97.5°F | Resp 15 | Ht 66.0 in | Wt 165.0 lb

## 2018-05-18 DIAGNOSIS — I209 Angina pectoris, unspecified: Secondary | ICD-10-CM

## 2018-05-18 DIAGNOSIS — I25118 Atherosclerotic heart disease of native coronary artery with other forms of angina pectoris: Secondary | ICD-10-CM

## 2018-05-18 DIAGNOSIS — R5383 Other fatigue: Secondary | ICD-10-CM

## 2018-05-18 DIAGNOSIS — R001 Bradycardia, unspecified: Secondary | ICD-10-CM

## 2018-05-18 DIAGNOSIS — I208 Other forms of angina pectoris: Secondary | ICD-10-CM

## 2018-05-18 DIAGNOSIS — Z951 Presence of aortocoronary bypass graft: Secondary | ICD-10-CM | POA: Diagnosis not present

## 2018-05-18 LAB — COMPREHENSIVE METABOLIC PANEL
ALK PHOS: 123 U/L — AB (ref 39–117)
ALT: 21 U/L (ref 0–53)
AST: 25 U/L (ref 0–37)
Albumin: 4.1 g/dL (ref 3.5–5.2)
BUN: 16 mg/dL (ref 6–23)
CO2: 27 mEq/L (ref 19–32)
Calcium: 9.6 mg/dL (ref 8.4–10.5)
Chloride: 108 mEq/L (ref 96–112)
Creatinine, Ser: 1.15 mg/dL (ref 0.40–1.50)
GFR: 65.45 mL/min (ref >60.00)
GLUCOSE: 102 mg/dL — AB (ref 70–99)
POTASSIUM: 5 meq/L (ref 3.5–5.1)
Sodium: 139 mEq/L (ref 135–145)
Total Bilirubin: 0.6 mg/dL (ref 0.2–1.2)
Total Protein: 6.6 g/dL (ref 6.0–8.3)

## 2018-05-18 LAB — CBC WITH DIFFERENTIAL/PLATELET
BASOS ABS: 0 10*3/uL (ref 0.0–0.1)
Basophils Relative: 0.7 % (ref 0.0–3.0)
EOS PCT: 2.8 % (ref 0.0–5.0)
Eosinophils Absolute: 0.2 10*3/uL (ref 0.0–0.7)
HEMATOCRIT: 43.5 % (ref 39.0–52.0)
HEMOGLOBIN: 14.8 g/dL (ref 13.0–17.0)
Lymphocytes Relative: 17.2 % (ref 12.0–46.0)
Lymphs Abs: 1.2 10*3/uL (ref 0.7–4.0)
MCHC: 34 g/dL (ref 30.0–36.0)
MCV: 93.2 fl (ref 78.0–100.0)
MONOS PCT: 11.6 % (ref 3.0–12.0)
Monocytes Absolute: 0.8 10*3/uL (ref 0.1–1.0)
Neutro Abs: 4.7 10*3/uL (ref 1.4–7.7)
Neutrophils Relative %: 67.7 % (ref 43.0–77.0)
Platelets: 162 10*3/uL (ref 150.0–400.0)
RBC: 4.67 Mil/uL (ref 4.22–5.81)
RDW: 14.1 % (ref 11.5–15.5)
WBC: 7 10*3/uL (ref 4.0–10.5)

## 2018-05-18 LAB — LIPID PANEL
CHOL/HDL RATIO: 3
Cholesterol: 137 mg/dL (ref 0–200)
HDL: 49 mg/dL (ref >39.00)
LDL Cholesterol: 78 mg/dL (ref 0–99)
NONHDL: 87.64
Triglycerides: 50 mg/dL (ref 0.0–149.0)
VLDL: 10 mg/dL (ref 0.0–40.0)

## 2018-05-18 LAB — TSH: TSH: 3.92 u[IU]/mL (ref 0.35–4.50)

## 2018-05-18 MED ORDER — DILTIAZEM HCL ER COATED BEADS 120 MG PO CP24: 120.0000 mg | ORAL_CAPSULE | Freq: Every day | ORAL | 0 refills | Status: DC

## 2018-05-18 MED ORDER — NITROGLYCERIN 0.4 MG SL SUBL: 0.4000 mg | SUBLINGUAL_TABLET | SUBLINGUAL | 3 refills | Status: DC | PRN

## 2018-05-18 NOTE — Patient Instructions (Addendum)
I am reducing your diltiazem dose to 120 mg daily because your heart rate is too slow.  I have also sent in a prescription for sublingual nitroglycerin to take (under the tongue) if you develop chest pain   Our office will call Dr Saralyn Pilar and get you an earlier appointment     Angina Pectoris Angina pectoris is a very bad feeling in the chest, neck, or arm. Your doctor may call it angina. There are four types of angina. Angina is caused by a lack of blood in the middle and thickest layer of the heart wall (myocardium). Angina may feel like a crushing or squeezing pain in the chest. It may feel like tightness or heavy pressure in the chest. Some people say it feels like gas, heartburn, or indigestion. Some people have symptoms other than pain. These include:  Shortness of breath.  Cold sweats.  Feeling sick to your stomach (nausea).  Feeling light-headed.  Many women have chest discomfort and some of the other symptoms. However, women often have different symptoms, such as:  Feeling tired (fatigue).  Feeling nervous for no reason.  Feeling weak for no reason.  Dizziness or fainting.  Women may have angina without any symptoms. Follow these instructions at home:  Take medicines only as told by your doctor.  Take care of other health issues as told by your doctor. These include: ? High blood pressure (hypertension). ? Diabetes.  Follow a heart-healthy diet. Your doctor can help you to choose healthy food options and make changes.  Talk to your doctor to learn more about healthy cooking methods and use them. These include: ? Roasting. ? Grilling. ? Broiling. ? Baking. ? Poaching. ? Steaming. ? Stir-frying.  Follow an exercise program approved by your doctor.  Keep a healthy weight. Lose weight as told by your doctor.  Rest when you are tired.  Learn to manage stress.  Do not use any tobacco, such as cigarettes, chewing tobacco, or electronic cigarettes. If you  need help quitting, ask your doctor.  If you drink alcohol, and your doctor says it is okay, limit yourself to no more than 1 drink per day. One drink equals 12 ounces of beer, 5 ounces of wine, or 1 ounces of hard liquor.  Stop illegal drug use.  Keep all follow-up visits as told by your doctor. This is important. Do not take these medicines unless your doctor says that you can:  Nonsteroidal anti-inflammatory drugs (NSAIDs). These include: ? Ibuprofen. ? Naproxen. ? Celecoxib.  Vitamin supplements that have vitamin A, vitamin E, or both.  Hormone therapy that contains estrogen with or without progestin.  Get help right away if:  You have pain in your chest, neck, arm, jaw, stomach, or back that: ? Lasts more than a few minutes. ? Comes back. ? Does not get better after you take medicine under your tongue (sublingual nitroglycerin).  You have any of these symptoms for no reason: ? Gas, heartburn, or indigestion. ? Sweating a lot. ? Shortness of breath or trouble breathing. ? Feeling sick to your stomach or throwing up. ? Feeling more tired than usual. ? Feeling nervous or worrying more than usual. ? Feeling weak. ? Diarrhea.  You are suddenly dizzy or light-headed.  You faint or pass out. These symptoms may be an emergency. Do not wait to see if the symptoms will go away. Get medical help right away. Call your local emergency services (911 in the [place] Do not drive yourself to the hospital.  This information is not intended to replace advice given to you by your health care provider. Make sure you discuss any questions you have with your health care provider. Document Released: 04/26/2008 Document Revised: 04/15/2016 Document Reviewed: 03/12/2014 Elsevier Interactive Patient Education  2017 [employer].

## 2018-05-18 NOTE — Progress Notes (Signed)
Subjective:  Patient ID: John Burns, male    DOB: 12/05/1940  Age: 77 y.o. MRN: [id number]  CC: The primary encounter diagnosis was Angina pectoris ([place]). 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  HPI John Burns presents for 6 month follow up on Hypertension and CAD.  Hypertension:  : patient checks blood pressure twice weekly at home.  Readings have been for the most part <130/80 at rest . Patient is following a reduced salt diet most days and is taking medications as prescribed   Cc: for the last month he has been having episodes of Chest tightness and dyspnea that occur with exertion . He has not had chest pain,  Jaw pai,  Or diaphoresis.  However he has known CAD which presented with similar symptoms . Does not have NTG.  Next appointment with his cardiologist is in September .     Histoyr of tobacco abuse. . has seen pulmonology and has no diagnosis of  COPD. By PFTs done in 2015.  Quit smoking in 2004.  Chronic back pain,  nonradiating . No change.   Outpatient Medications Prior to Visit  Medication Sig Dispense Refill  . aspirin 81 MG tablet Take 1 tablet (81 mg total) by mouth daily. 90 tablet 1  . atorvastatin (LIPITOR) 20 MG tablet TAKE ONE TABLET BY MOUTH ONCE DAILY AT 6 PM 90 tablet 2  . Calcium Carb-Cholecalciferol (CALCIUM-VITAMIN D) 500-200 MG-UNIT tablet Take 1 tablet by mouth daily.    . metoprolol tartrate (LOPRESSOR) 50 MG tablet Take 1 tablet (50 mg total) by mouth daily. 90 tablet 1  . omeprazole (PRILOSEC) 20 MG capsule Take 1 capsule (20 mg total) by mouth daily. 90 capsule 1  . sildenafil (VIAGRA) 100 MG tablet TAKE ONE TABLET BY MOUTH ONCE DAILY AS NEEDED FOR  ERECTILE  DYSFUNCTION 5 tablet 0  . vitamin E 400 UNIT capsule Take 400 Units by mouth daily.    Marland Kitchen  diltiazem (CARTIA XT) 180 MG 24 hr capsule Take 1 capsule (180 mg total) by mouth daily. 90 capsule 2   No facility-administered medications prior to visit.     Review of Systems;  Patient denies headache, fevers, malaise, unintentional weight loss, skin rash, eye pain, sinus congestion and sinus pain, sore throat, dysphagia,  hemoptysis , cough,  wheezing, chest pain, palpitations, orthopnea, edema, abdominal pain, nausea, melena, diarrhea, constipation, flank pain, dysuria, hematuria, urinary  Frequency, nocturia, numbness, tingling, seizures,  Focal weakness, Loss of consciousness,  Tremor, insomnia, depression, anxiety, and suicidal ideation.      Objective:  BP 130/68 (BP Location: Left Arm, Patient Position: Sitting, Cuff Size: Normal)   Pulse (!) 52   Temp (!) 97.5 F (36.4 C) (Oral)   Resp 15   Ht 5\' 6"  (1.676 m)   Wt 165 lb (74.8 kg)   SpO2 97%   BMI 26.63 kg/m    BP Readings from Last 3 Encounters:  05/18/18 130/68  11/17/17 (!) 150/74  07/19/17 140/60    Wt Readings from Last 3 Encounters:  05/18/18 165 lb (74.8 kg)  11/17/17 167 lb 9.6 oz (76 kg)  07/19/17 164 lb 1.9 oz (74.4 kg)    General appearance: alert, cooperative and appears stated age Ears: normal TM's and external ear canals both ears Throat: lips, mucosa, and tongue normal; teeth  and gums normal Neck: no adenopathy, no carotid bruit, supple, symmetrical, trachea midline and thyroid not enlarged, symmetric, no tenderness/mass/nodules Back: symmetric, no curvature. ROM normal. No CVA tenderness. Lungs: clear to auscultation bilaterally Heart: bradycardic  rate and rhythm, S1, S2 normal, no murmur, click, rub or gallop Abdomen: soft, non-tender; bowel sounds normal; no masses,  no organomegaly Pulses: 2+ and symmetric Skin: Skin color, texture, turgor normal. No rashes or lesions Lymph nodes: Cervical, supraclavicular, and axillary nodes normal.  No results found for: HGBA1C  Lab Results    Component Value Date   CREATININE 1.15 05/18/2018   CREATININE 1.07 11/17/2017   CREATININE 1.04 05/18/2017    Lab Results  Component Value Date   WBC 7.0 05/18/2018   HGB 14.8 05/18/2018   HCT 43.5 05/18/2018   PLT 162.0 05/18/2018   GLUCOSE 102 (H) 05/18/2018   CHOL 137 05/18/2018   TRIG 50.0 05/18/2018   HDL 49.00 05/18/2018   LDLCALC 78 05/18/2018   ALT 21 05/18/2018   AST 25 05/18/2018   NA 139 05/18/2018   K 5.0 05/18/2018   CL 108 05/18/2018   CREATININE 1.15 05/18/2018   BUN 16 05/18/2018   CO2 27 05/18/2018   TSH 3.92 05/18/2018   PSA 2.33 05/22/2015    US Abdomen Limited Ruq  Result Date: 05/27/2016 CLINICAL DATA:  Elevated alkaline phosphatase.  Weight loss. EXAM: US ABDOMEN LIMITED - RIGHT UPPER QUADRANT COMPARISON:  06/11/2015. FINDINGS: Gallbladder: No gallstones or wall thickening visualized. No sonographic Murphy sign noted by sonographer. Common bile duct: Diameter: 3.7 mm Liver: No focal abnormality. Previously identified hepatic cyst not identified on today's exam. Liver echotexture normal. IMPRESSION: No acute or focal abnormality. No evidence of gallstones or biliary distention.Previously identified small hepatic cyst not identified on today's exam. 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  He has  a history of CAD that presented similarly years ago.Marland Kitchen He was given NTG SL to use for next occurrence and will be set up to his his cardiologist asap.       Relevant Medications   diltiazem (CARDIZEM CD) 120 MG 24 hr capsule   nitroGLYCERIN (NITROSTAT) 0.4 MG SL tablet   Sinus bradycardia on ECG    Reducing his dose of cardizem to 120 mg daily for pulse < 55 and symptos  suggestive of decreased cardiac output       Other Visit Diagnoses     Angina pectoris ([facility])    -  Primary   Relevant Medications   diltiazem (CARDIZEM CD) 120 MG 24 hr capsule   nitroGLYCERIN (NITROSTAT) 0.4 MG SL tablet   Other Relevant Orders   EKG 12-Lead (Completed)   DG Chest 2 View (Completed)   Ambulatory referral to Cardiology   Coronary artery disease involving native coronary artery of native heart with other form of angina pectoris ([facility])       Relevant Medications   diltiazem (CARDIZEM CD) 120 MG 24 hr capsule   nitroGLYCERIN (NITROSTAT) 0.4 MG SL tablet   Other Relevant Orders   Lipid panel (Completed)   Comprehensive metabolic panel (Completed)   DG Chest 2 View (Completed)   Ambulatory referral to Cardiology   Other fatigue  Relevant Orders   CBC with Differential/Platelet (Completed)   TSH (Completed)      I have changed Rodrick L. Pinales's diltiazem. I am also having him start on nitroGLYCERIN. Additionally, I am having him maintain his vitamin E, aspirin, calcium-vitamin D, atorvastatin, metoprolol tartrate, omeprazole, and sildenafil.  Meds ordered this encounter  Medications  . diltiazem (CARDIZEM CD) 120 MG 24 hr capsule    Sig: Take 1 capsule (120 mg total) by mouth daily.    Dispense:  90 capsule    Refill:  0  . nitroGLYCERIN (NITROSTAT) 0.4 MG SL tablet    Sig: Place 1 tablet (0.4 mg total) under the tongue every 5 (five) minutes as needed for chest pain.    Dispense:  50 tablet    Refill:  3    Medications Discontinued During This Encounter  Medication Reason  . diltiazem (CARTIA XT) 180 MG 24 hr capsule     Follow-up: Return in about 6 months (around 11/17/2018).   Crecencio Mc, MD

## 2018-05-19 ENCOUNTER — Telehealth: Payer: Self-pay

## 2018-05-19 NOTE — Telephone Encounter (Signed)
Copied from [place] [id number]. Topic: General - Other >> May 19, 2018  2:38 PM Yvette Rack wrote: Reason for CRM: Lovey Newcomer with [facility] clinic is requesting pt EKG be faxed to them at fax# [phone]  EKG faxed to [facility] attn: Lovey Newcomer at the number listed.

## 2018-05-20 DIAGNOSIS — I208 Other forms of angina pectoris: Secondary | ICD-10-CM | POA: Insufficient documentation

## 2018-05-20 DIAGNOSIS — R001 Bradycardia, unspecified: Secondary | ICD-10-CM | POA: Insufficient documentation

## 2018-05-20 HISTORY — DX: Bradycardia, unspecified: R00.1

## 2018-05-20 NOTE — Assessment & Plan Note (Signed)
Reducing his dose of cardizem to 120 mg daily for pulse < 55 and symptos  suggestive of decreased cardiac output

## 2018-05-20 NOTE — Assessment & Plan Note (Signed)
Patient has no signs of current ischemia on today's EKG,  And chest x ray is normal as well.  He has  a history of CAD that presented similarly years ago.Marland Kitchen He was given NTG SL to use for next occurrence and will be set up to his his cardiologist asap.

## 2018-05-22 DIAGNOSIS — R0602 Shortness of breath: Secondary | ICD-10-CM | POA: Diagnosis not present

## 2018-05-22 DIAGNOSIS — I25708 Atherosclerosis of coronary artery bypass graft(s), unspecified, with other forms of angina pectoris: Secondary | ICD-10-CM | POA: Diagnosis not present

## 2018-05-23 ENCOUNTER — Telehealth: Payer: Self-pay | Admitting: Internal Medicine

## 2018-05-23 ENCOUNTER — Ambulatory Visit (INDEPENDENT_AMBULATORY_CARE_PROVIDER_SITE_OTHER): Payer: Medicare Other

## 2018-05-23 DIAGNOSIS — E538 Deficiency of other specified B group vitamins: Secondary | ICD-10-CM

## 2018-05-23 MED ORDER — CYANOCOBALAMIN 1000 MCG/ML IJ SOLN
1000.0000 ug | Freq: Once | INTRAMUSCULAR | Status: AC
  Administered 2018-05-23: 1000 ug via INTRAMUSCULAR

## 2018-05-23 NOTE — Progress Notes (Addendum)
Patient comes in to   I have reviewed the above information and agree with above.   Deborra Medina, MD

## 2018-05-23 NOTE — Telephone Encounter (Signed)
Copied from [place] [id number]. Topic: General - Other >> May 23, 2018 11:43 AM Yvette Rack wrote: Reason for CRM: pt calling back about lab results

## 2018-05-23 NOTE — Telephone Encounter (Signed)
Results given and documented in result note. 

## 2018-06-04 ENCOUNTER — Other Ambulatory Visit: Payer: Self-pay | Admitting: Internal Medicine

## 2018-06-05 ENCOUNTER — Other Ambulatory Visit: Payer: Self-pay

## 2018-06-05 ENCOUNTER — Telehealth: Payer: Self-pay | Admitting: Internal Medicine

## 2018-06-05 MED ORDER — ATORVASTATIN CALCIUM 20 MG PO TABS: ORAL_TABLET | ORAL | 2 refills | Status: DC

## 2018-06-05 NOTE — Telephone Encounter (Unsigned)
Copied from [place] [id number]. Topic: Quick Communication - Rx Refill/Question >> Jun 05, 2018  9:02 AM Judyann Munson wrote: Medication: atorvastatin (LIPITOR) 20 MG tablet   Has the patient contacted their pharmacy? No   Preferred Pharmacy (with phone number or street name): [place], [place] [address] Phone: [phone] Fax: [phone]    Agent: Please be advised that RX refills may take up to 3 business days. We ask that you follow-up with your pharmacy.

## 2018-06-27 ENCOUNTER — Ambulatory Visit (INDEPENDENT_AMBULATORY_CARE_PROVIDER_SITE_OTHER): Payer: Medicare Other

## 2018-06-27 DIAGNOSIS — E538 Deficiency of other specified B group vitamins: Secondary | ICD-10-CM | POA: Diagnosis not present

## 2018-06-27 MED ORDER — CYANOCOBALAMIN 1000 MCG/ML IJ SOLN
1000.0000 ug | Freq: Once | INTRAMUSCULAR | Status: AC
  Administered 2018-06-27: 1000 ug via INTRAMUSCULAR

## 2018-06-28 NOTE — Progress Notes (Signed)
Patient comes in for B 12 injection.  Injected right deltoid.  Patient tolerated injection well.   

## 2018-07-02 NOTE — Progress Notes (Signed)
  I have reviewed the above information and agree with above.   Addysyn Fern, MD 

## 2018-07-03 DIAGNOSIS — E785 Hyperlipidemia, unspecified: Secondary | ICD-10-CM | POA: Diagnosis not present

## 2018-07-03 DIAGNOSIS — Z9889 Other specified postprocedural states: Secondary | ICD-10-CM | POA: Diagnosis not present

## 2018-07-03 DIAGNOSIS — I471 Supraventricular tachycardia: Secondary | ICD-10-CM | POA: Diagnosis not present

## 2018-07-03 DIAGNOSIS — I2510 Atherosclerotic heart disease of native coronary artery without angina pectoris: Secondary | ICD-10-CM | POA: Diagnosis not present

## 2018-07-03 DIAGNOSIS — Z951 Presence of aortocoronary bypass graft: Secondary | ICD-10-CM | POA: Diagnosis not present

## 2018-07-03 DIAGNOSIS — J449 Chronic obstructive pulmonary disease, unspecified: Secondary | ICD-10-CM | POA: Diagnosis not present

## 2018-07-03 DIAGNOSIS — R0602 Shortness of breath: Secondary | ICD-10-CM | POA: Diagnosis not present

## 2018-07-03 DIAGNOSIS — I25708 Atherosclerosis of coronary artery bypass graft(s), unspecified, with other forms of angina pectoris: Secondary | ICD-10-CM | POA: Diagnosis not present

## 2018-07-20 ENCOUNTER — Ambulatory Visit (INDEPENDENT_AMBULATORY_CARE_PROVIDER_SITE_OTHER): Payer: Medicare Other

## 2018-07-20 VITALS — BP 136/74 | HR 58 | Temp 97.9°F | Resp 14 | Ht 65.0 in | Wt 161.0 lb

## 2018-07-20 DIAGNOSIS — Z0000 Encounter for general adult medical examination without abnormal findings: Secondary | ICD-10-CM | POA: Diagnosis not present

## 2018-07-20 NOTE — Patient Instructions (Addendum)
  John Burns , Thank you for taking time to come for your Medicare Wellness Visit. I appreciate your ongoing commitment to your health goals. Please review the following plan we discussed and let me know if I can assist you in the future.   These are the goals we discussed: Goals    . Maintain Healthy Lifestyle     Exercise Stay hydrated Healthy diet       This is a list of the screening recommended for you and due dates:  Health Maintenance  Topic Date Due  . Flu Shot  06/22/2018  . Colon Cancer Screening  02/20/2019  . Tetanus Vaccine  11/22/2019  . Pneumonia vaccines  Completed

## 2018-07-20 NOTE — Progress Notes (Signed)
Subjective:   John Burns is a 77 y.o. male who presents for Medicare Annual/Subsequent preventive examination.  Review of Systems:  No ROS.  Medicare Wellness Visit. Additional risk factors are reflected in the social history. Cardiac Risk Factors include: advanced age (>60men, >49 women);male gender;hypertension     Objective:    Vitals: BP 136/74 (BP Location: Left Arm, Patient Position: Sitting, Cuff Size: Normal)   Pulse (!) 58   Temp 97.9 F (36.6 C) (Oral)   Resp 14   Ht 5\' 5"  (1.651 m)   Wt 161 lb (73 kg)   SpO2 98%   BMI 26.79 kg/m   Body mass index is 26.79 kg/m.  Advanced Directives 07/20/2018 07/19/2016 12/29/2015  Does Patient Have a Medical Advance Directive? No Yes -  Type of Advance Directive - [place] in Chart? - No - copy requested -  Would patient like information on creating a medical advance directive? No - Patient declined - Yes - Scientist, clinical (histocompatibility and immunogenetics) given    Tobacco Social History   Tobacco Use  Smoking Status Former Smoker  . Packs/day: 1.00  . Years: 50.00  . Pack years: 50.00  . Types: Cigarettes  . Last attempt to quit: 11/22/1993  . Years since quitting: 24.6  Smokeless Tobacco Former Systems developer  . Types: Chew  Tobacco Comment   used chew to help quit smoking X1 year.     Counseling given: Not Answered Comment: used chew to help quit smoking X1 year.   Clinical Intake:  Pre-visit preparation completed: Yes  Pain : No/denies pain     Nutritional Status: BMI 25 -29 Overweight Diabetes: No  How often do you need to have someone help you when you read instructions, pamphlets, or other written materials from your doctor or pharmacy?: 1 - Never  Interpreter Needed?: No     Past Medical History:  Diagnosis Date  . ASCVD (arteriosclerotic cardiovascular disease)   . Bladder neck obstruction   . CAD (coronary artery disease) 2007   s/p PCI to mid LAD  . COPD (chronic  obstructive pulmonary disease) ([place])   . Diverticulosis   . GERD (gastroesophageal reflux disease)   . Heart attack ([place])   . History of hiatal hernia   . History of tobacco abuse   . HOH (hard of hearing)   . Hypercholesteremia   . Hypertension   . Shortness of breath dyspnea    on exertion  . Stenosis of coronary stent 2013   Past Surgical History:  Procedure Laterality Date  . ANGIOPLASTY / STENTING FEMORAL    . CARDIAC CATHETERIZATION  05/10/12   patent LAD stent w/ 50% in stent restenosis; 70% stenosis ostium of left circumflex; 75% stenosis mid left circumflex  . CATARACT EXTRACTION W/PHACO Left 12/29/2015   Procedure: CATARACT EXTRACTION PHACO AND INTRAOCULAR LENS PLACEMENT (IOC);  Surgeon: Estill Cotta, MD;  Location: [facility] ORS;  Service: Ophthalmology;  Laterality: Left;  [place]: 01:53.8 AP%: 26.2 CDE: 49.38 Lot # [id number]  . CATARACT EXTRACTION W/PHACO Right 01/26/2016   Procedure: CATARACT EXTRACTION PHACO AND INTRAOCULAR LENS PLACEMENT (IOC);  Surgeon: Estill Cotta, MD;  Location: [facility] ORS;  Service: Ophthalmology;  Laterality: Right;  [place] 01:13 AP% 25.3 CDE 33.95 fluid pack lot # [id number]  . COLONOSCOPY  02/05/14  . CORONARY ARTERY BYPASS GRAFT  06/04/14   x2 [facility]  2007  . ESOPHAGOGASTRODUODENOSCOPY  02/05/14   Family  History  Problem Relation Age of Onset  . Heart disease Mother   . Cancer Father 65       prostate and colon cancer   Social History   Socioeconomic History  . Marital status: Married    Spouse name: Not on file  . Number of children: Not on file  . Years of education: Not on file  . Highest education level: Not on file  Occupational History  . Not on file  Social Needs  . Financial resource strain: Not hard at all  . Food insecurity:    Worry: Never true    Inability: Never true  . Transportation needs:    Medical: No    Non-medical: No  Tobacco Use  . Smoking status: Former Smoker    Packs/day:  1.00    Years: 50.00    Pack years: 50.00    Types: Cigarettes    Last attempt to quit: 11/22/1993    Years since quitting: 24.6  . Smokeless tobacco: Former Systems developer    Types: Chew  . Tobacco comment: used chew to help quit smoking X1 year.  Substance and Sexual Activity  . Alcohol use: No  . Drug use: No  . Sexual activity: Yes  Lifestyle  . Physical activity:    Days per week: 4 days    Minutes per session: 20 min  . Stress: Not at all  Relationships  . Social connections:    Talks on phone: Not on file    Gets together: Not on file    Attends religious service: Not on file    Active member of club or organization: Not on file    Attends meetings of clubs or organizations: Not on file    Relationship status: Not on file  Other Topics Concern  . Not on file  Social History Narrative  . Not on file    Outpatient Encounter Medications as of 07/20/2018  Medication Sig  . aspirin 81 MG tablet Take 1 tablet (81 mg total) by mouth daily.  Marland Kitchen atorvastatin (LIPITOR) 20 MG tablet TAKE ONE TABLET BY MOUTH ONCE DAILY AT 6 PM  . Calcium Carb-Cholecalciferol (CALCIUM-VITAMIN D) 500-200 MG-UNIT tablet Take 1 tablet by mouth daily.  Marland Kitchen diltiazem (CARDIZEM CD) 120 MG 24 hr capsule Take 1 capsule (120 mg total) by mouth daily.  . metoprolol tartrate (LOPRESSOR) 50 MG tablet Take 1 tablet (50 mg total) by mouth daily.  . nitroGLYCERIN (NITROSTAT) 0.4 MG SL tablet Place 1 tablet (0.4 mg total) under the tongue every 5 (five) minutes as needed for chest pain.  Marland Kitchen omeprazole (PRILOSEC) 20 MG capsule TAKE 1 CAPSULE BY MOUTH DAILY  . sildenafil (VIAGRA) 100 MG tablet TAKE ONE TABLET BY MOUTH ONCE DAILY AS NEEDED FOR  ERECTILE  DYSFUNCTION  . vitamin E 400 UNIT capsule Take 400 Units by mouth daily.   No facility-administered encounter medications on file as of 07/20/2018.     Activities of Daily Living In your present state of health, do you have any difficulty performing the following activities:  07/20/2018  Hearing? N  Vision? N  Difficulty concentrating or making decisions? N  Walking or climbing stairs? N  Dressing or bathing? N  Doing errands, shopping? N  Preparing Food and eating ? N  Using the Toilet? N  In the past six months, have you accidently leaked urine? N  Do you have problems with loss of bowel control? N  Managing your Medications? N  Managing your Finances? N  Housekeeping or managing your Housekeeping? N  Some recent data might be hidden    Patient Care Team: Crecencio Mc, MD as PCP - General (Internal Medicine)   Assessment:   This is a routine wellness examination for [facility].  The goal of the wellness visit is to assist the patient how to close the gaps in care and create a preventative care plan for the patient.   The roster of all physicians providing medical care to patient is listed in the Snapshot section of the chart.  Taking calcium VIT D as appropriate/Osteoporosis risk reviewed.    Safety issues reviewed; Smoke and carbon monoxide detectors in the home. Firearm safety discussed. Wears seatbelts when driving or riding with others. No violence in the home.  They do not have excessive sun exposure.  Discussed the need for sun protection: hats, long sleeves and the use of sunscreen if there is significant sun exposure.  Patient is alert, normal appearance, oriented to person/place/and time.  Correctly identified the president of the [place] and recalls of 3/3 words. Performs simple calculations and can read correct time from watch face.  Displays appropriate judgement.  No new identified risk were noted.  No failures at ADL's or IADL's.    BMI- discussed the importance of a healthy diet, water intake and the benefits of aerobic exercise. Educational material provided.   24 hour diet recall: Regular diet  Dental- dentures.   Sleep patterns- Sleeps without issues through the night.   Influenza vaccine discussed. He plans to receive later  in the season.   Patient Concerns: None at this time. Follow up with PCP as needed.  Exercise Activities and Dietary recommendations Current Exercise Habits: Home exercise routine, Time (Minutes): 20, Frequency (Times/Week): 4, Weekly Exercise (Minutes/Week): 80, Intensity: Moderate  Goals    . Maintain Healthy Lifestyle     Exercise Stay hydrated Healthy diet       Fall Risk Fall Risk  07/20/2018 07/19/2017 11/17/2016 07/19/2016 11/21/2014  Falls in the past year? No No No No Yes  Number falls in past yr: - - - - 1  Injury with Fall? - - - - Yes  Comment - - - - Bruising   Depression Screen PHQ 2/9 Scores 07/20/2018 07/19/2017 11/17/2016 07/19/2016  PHQ - 2 Score 0 0 0 0    Cognitive Function MMSE - Mini Mental State Exam 07/20/2018 07/19/2016  Orientation to time 5 5  Orientation to Place 5 5  Registration 3 3  Attention/ Calculation 5 5  Recall 3 2  Language- name 2 objects 2 2  Language- repeat 1 1  Language- follow 3 step command 3 3  Language- read & follow direction 1 1  Write a sentence 1 1  Copy design 1 1  Total score 30 29     6CIT Screen 07/19/2017  What Year? 0 points  What month? 0 points  What time? 0 points  Count back from 20 0 points  Months in reverse 0 points  Repeat phrase 0 points  Total Score 0    Immunization History  Administered Date(s) Administered  . Influenza Split 09/26/2013  . Influenza, High Dose Seasonal PF 07/28/2016, 08/23/2017  . Influenza,inj,Quad PF,6+ Mos 08/28/2014, 09/30/2015  . Pneumococcal Conjugate-13 12/18/2014  . Pneumococcal Polysaccharide-23 11/26/2013  . Tdap 11/21/2009  . Zoster 11/21/2004   Screening Tests Health Maintenance  Topic Date Due  . INFLUENZA VACCINE  06/22/2018  . COLONOSCOPY  02/20/2019  . TETANUS/TDAP  11/22/2019  .  PNA vac Low Risk Adult  Completed      Plan:   End of life planning; Advanced aging; Advanced directives discussed.  No HCPOA/Living Will.  Additional information declined  at this time.  I have personally reviewed and noted the following in the patient's chart:   . Medical and social history . Use of alcohol, tobacco or illicit drugs  . Current medications and supplements . Functional ability and status . Nutritional status . Physical activity . Advanced directives . List of other physicians . Hospitalizations, surgeries, and ER visits in previous 12 months . Vitals . Screenings to include cognitive, depression, and falls . Referrals and appointments  In addition, I have reviewed and discussed with patient certain preventive protocols, quality metrics, and best practice recommendations. A written personalized care plan for preventive services as well as general preventive health recommendations were provided to patient.     Varney Biles, LPN  3/70/4888   Reviewed above information.  Agree with assessment and plan.    Dr Nicki Reaper

## 2018-07-23 ENCOUNTER — Other Ambulatory Visit: Payer: Self-pay | Admitting: Internal Medicine

## 2018-08-01 ENCOUNTER — Ambulatory Visit (INDEPENDENT_AMBULATORY_CARE_PROVIDER_SITE_OTHER): Payer: Medicare Other | Admitting: *Deleted

## 2018-08-01 DIAGNOSIS — E538 Deficiency of other specified B group vitamins: Secondary | ICD-10-CM

## 2018-08-02 DIAGNOSIS — E538 Deficiency of other specified B group vitamins: Secondary | ICD-10-CM | POA: Diagnosis not present

## 2018-08-02 MED ORDER — CYANOCOBALAMIN 1000 MCG/ML IJ SOLN
1000.0000 ug | Freq: Once | INTRAMUSCULAR | Status: AC
  Administered 2018-08-02: 1000 ug via INTRAMUSCULAR

## 2018-08-02 NOTE — Progress Notes (Signed)
Patient presented for B 12 injection to left deltoid, patient voiced no concerns nor showed any signs of distress during injection. 

## 2018-09-05 ENCOUNTER — Ambulatory Visit (INDEPENDENT_AMBULATORY_CARE_PROVIDER_SITE_OTHER): Payer: Medicare Other

## 2018-09-05 DIAGNOSIS — Z23 Encounter for immunization: Secondary | ICD-10-CM | POA: Diagnosis not present

## 2018-09-05 DIAGNOSIS — E538 Deficiency of other specified B group vitamins: Secondary | ICD-10-CM | POA: Diagnosis not present

## 2018-09-05 MED ORDER — CYANOCOBALAMIN 1000 MCG/ML IJ SOLN
1000.0000 ug | Freq: Once | INTRAMUSCULAR | Status: AC
  Administered 2018-09-05: 1000 ug via INTRAMUSCULAR

## 2018-09-05 NOTE — Progress Notes (Addendum)
Pt was seen today for a NV and was given high dose flu vaccination in LD. Pt tolerated well.   Pt received B-12 shot as well in RD.

## 2018-09-05 NOTE — Addendum Note (Signed)
Addended by: Myriam Forehand on: 09/05/2018 04:12 PM   Modules accepted: Orders

## 2018-10-05 DIAGNOSIS — I2510 Atherosclerotic heart disease of native coronary artery without angina pectoris: Secondary | ICD-10-CM | POA: Diagnosis not present

## 2018-10-05 DIAGNOSIS — R0602 Shortness of breath: Secondary | ICD-10-CM | POA: Diagnosis not present

## 2018-10-05 DIAGNOSIS — Z87891 Personal history of nicotine dependence: Secondary | ICD-10-CM | POA: Diagnosis not present

## 2018-10-05 DIAGNOSIS — E785 Hyperlipidemia, unspecified: Secondary | ICD-10-CM | POA: Diagnosis not present

## 2018-10-05 DIAGNOSIS — J449 Chronic obstructive pulmonary disease, unspecified: Secondary | ICD-10-CM | POA: Diagnosis not present

## 2018-10-05 DIAGNOSIS — Z9889 Other specified postprocedural states: Secondary | ICD-10-CM | POA: Diagnosis not present

## 2018-10-05 DIAGNOSIS — I471 Supraventricular tachycardia: Secondary | ICD-10-CM | POA: Diagnosis not present

## 2018-10-05 DIAGNOSIS — I25708 Atherosclerosis of coronary artery bypass graft(s), unspecified, with other forms of angina pectoris: Secondary | ICD-10-CM | POA: Diagnosis not present

## 2018-10-05 DIAGNOSIS — Z951 Presence of aortocoronary bypass graft: Secondary | ICD-10-CM | POA: Diagnosis not present

## 2018-10-05 DIAGNOSIS — R079 Chest pain, unspecified: Secondary | ICD-10-CM | POA: Diagnosis not present

## 2018-10-09 ENCOUNTER — Telehealth: Payer: Self-pay

## 2018-10-09 NOTE — Telephone Encounter (Signed)
Called and notified patient he can come in before appointment with eye doctor.

## 2018-10-09 NOTE — Telephone Encounter (Signed)
Copied from [place] [id number]. Topic: Appointment Scheduling - Scheduling Inquiry for Clinic >> Oct 09, 2018 10:00 AM Cecelia Byars, NT wrote: Reason for PTE:LMRAJHH called and has an appointment for a b12 at 400 pm on the 10/11/18 and he has another appointment on that day at 900 am day and he would like to come in before his 9 00 am appointment , please call [phone]

## 2018-10-11 ENCOUNTER — Ambulatory Visit (INDEPENDENT_AMBULATORY_CARE_PROVIDER_SITE_OTHER): Payer: Medicare Other | Admitting: *Deleted

## 2018-10-11 DIAGNOSIS — E538 Deficiency of other specified B group vitamins: Secondary | ICD-10-CM | POA: Diagnosis not present

## 2018-10-11 DIAGNOSIS — Z961 Presence of intraocular lens: Secondary | ICD-10-CM | POA: Diagnosis not present

## 2018-10-11 MED ORDER — CYANOCOBALAMIN 1000 MCG/ML IJ SOLN
1000.0000 ug | Freq: Once | INTRAMUSCULAR | Status: AC
  Administered 2018-10-11: 1000 ug via INTRAMUSCULAR

## 2018-10-11 NOTE — Progress Notes (Signed)
Patient presented for B 12 injection to right deltoid, patient voiced no concerns nor showed any signs of distress during injection. 

## 2018-10-29 ENCOUNTER — Other Ambulatory Visit: Payer: Self-pay | Admitting: Internal Medicine

## 2018-10-30 ENCOUNTER — Other Ambulatory Visit: Payer: Self-pay | Admitting: Internal Medicine

## 2018-10-30 NOTE — Telephone Encounter (Signed)
Copied from [place] [id number]. Topic: Quick Communication - Rx Refill/Question >> Oct 30, 2018  8:51 AM Blase Mess A wrote: Medication: metoprolol tartrate (LOPRESSOR) 50 MG tablet [[id number]]   Has the patient contacted their pharmacy? Yes  (Agent: If no, request that the patient contact the pharmacy for the refill.) (Agent: If yes, when and what did the pharmacy advise?)  Preferred Pharmacy (with phone number or street name):[place], [place] [address] Phone: [phone] Fax: [phone]    Agent: Please be advised that RX refills may take up to 3 business days. We ask that you follow-up with your pharmacy.

## 2018-11-17 ENCOUNTER — Ambulatory Visit (INDEPENDENT_AMBULATORY_CARE_PROVIDER_SITE_OTHER): Payer: Medicare Other | Admitting: Internal Medicine

## 2018-11-17 ENCOUNTER — Encounter: Payer: Self-pay | Admitting: Internal Medicine

## 2018-11-17 VITALS — BP 138/74 | HR 51 | Temp 97.3°F | Resp 14 | Ht 65.0 in | Wt 164.2 lb

## 2018-11-17 DIAGNOSIS — E785 Hyperlipidemia, unspecified: Secondary | ICD-10-CM

## 2018-11-17 DIAGNOSIS — I10 Essential (primary) hypertension: Secondary | ICD-10-CM

## 2018-11-17 DIAGNOSIS — R001 Bradycardia, unspecified: Secondary | ICD-10-CM

## 2018-11-17 DIAGNOSIS — K22719 Barrett's esophagus with dysplasia, unspecified: Secondary | ICD-10-CM | POA: Diagnosis not present

## 2018-11-17 DIAGNOSIS — I209 Angina pectoris, unspecified: Secondary | ICD-10-CM

## 2018-11-17 DIAGNOSIS — M546 Pain in thoracic spine: Secondary | ICD-10-CM | POA: Diagnosis not present

## 2018-11-17 DIAGNOSIS — G8929 Other chronic pain: Secondary | ICD-10-CM | POA: Diagnosis not present

## 2018-11-17 DIAGNOSIS — E538 Deficiency of other specified B group vitamins: Secondary | ICD-10-CM | POA: Diagnosis not present

## 2018-11-17 LAB — COMPREHENSIVE METABOLIC PANEL
ALT: 22 U/L (ref 0–53)
AST: 22 U/L (ref 0–37)
Albumin: 4 g/dL (ref 3.5–5.2)
Alkaline Phosphatase: 104 U/L (ref 39–117)
BUN: 12 mg/dL (ref 6–23)
CO2: 26 mEq/L (ref 19–32)
Calcium: 9.4 mg/dL (ref 8.4–10.5)
Chloride: 108 mEq/L (ref 96–112)
Creatinine, Ser: 1.11 mg/dL (ref 0.40–1.50)
GFR: 68.09 mL/min (ref >60.00)
Glucose, Bld: 86 mg/dL (ref 70–99)
Potassium: 5.1 mEq/L (ref 3.5–5.1)
SODIUM: 140 meq/L (ref 135–145)
Total Bilirubin: 0.6 mg/dL (ref 0.2–1.2)
Total Protein: 6.1 g/dL (ref 6.0–8.3)

## 2018-11-17 LAB — LIPID PANEL
Cholesterol: 130 mg/dL (ref 0–200)
HDL: 44.2 mg/dL (ref >39.00)
LDL Cholesterol: 76 mg/dL (ref 0–99)
NonHDL: 86.28
Total CHOL/HDL Ratio: 3
Triglycerides: 53 mg/dL (ref 0.0–149.0)
VLDL: 10.6 mg/dL (ref 0.0–40.0)

## 2018-11-17 MED ORDER — CYANOCOBALAMIN 1000 MCG/ML IJ SOLN
1000.0000 ug | Freq: Once | INTRAMUSCULAR | Status: AC
  Administered 2018-11-17: 1000 ug via INTRAMUSCULAR

## 2018-11-17 NOTE — Patient Instructions (Addendum)
Your heart rate today is very slow (< 60 is SLOW)  .   I want you to measure it at home once or twice per week for the next several weeks.  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

## 2018-11-17 NOTE — Progress Notes (Signed)
Subjective:  Patient ID: John Burns, male    DOB: 1941/08/21  Age: 77 y.o. MRN: [id number]  CC: The primary encounter diagnosis was B12 deficiency. Diagnoses of Hyperlipidemia LDL goal <70, Essential hypertension, Sinus bradycardia on ECG, Chronic midline thoracic back pain, and Barrett's esophagus with dysplasia were also pertinent to this visit.  HPI John Burns presents for follow up on CAD s/p CABG. Hypertension , SVT and Barrett's esophagus .  Patient is taking his medications as prescribed and notes no adverse effects.  Home BP readings have not been done  he is avoiding added salt in his diet and walking regularly about 3 times per week for exercise  .  Saw cardiology in November after I reduced dose of cardizem due to persistent symptomatic bradycardia.   No changes to regimen  Continues to endorse mild fatigue.  Still working his farm.  Denies chest pain with exertion   Outpatient Medications Prior to Visit  Medication Sig Dispense Refill  . aspirin 81 MG tablet Take 1 tablet (81 mg total) by mouth daily. 90 tablet 1  . atorvastatin (LIPITOR) 20 MG tablet TAKE ONE TABLET BY MOUTH ONCE DAILY AT 6 PM 90 tablet 2  . Calcium Carb-Cholecalciferol (CALCIUM-VITAMIN D) 500-200 MG-UNIT tablet Take 1 tablet by mouth daily.    Marland Kitchen CARTIA XT 120 MG 24 hr capsule TAKE 1 CAPSULE BY MOUTH ONCE DAILY 90 capsule 1  . metoprolol tartrate (LOPRESSOR) 50 MG tablet TAKE 1 TABLET BY MOUTH ONCE DAILY 90 tablet 1  . nitroGLYCERIN (NITROSTAT) 0.4 MG SL tablet Place 1 tablet (0.4 mg total) under the tongue every 5 (five) minutes as needed for chest pain. 50 tablet 3  . omeprazole (PRILOSEC) 20 MG capsule TAKE 1 CAPSULE BY MOUTH DAILY 90 capsule 1  . sildenafil (VIAGRA) 100 MG tablet TAKE ONE TABLET BY MOUTH ONCE DAILY AS NEEDED FOR  ERECTILE  DYSFUNCTION 5 tablet 0  . vitamin E 400 UNIT capsule Take 400 Units by mouth daily.    . metoprolol tartrate (LOPRESSOR) 50 MG tablet Take 1 tablet (50 mg  total) by mouth daily. 90 tablet 1   No facility-administered medications prior to visit.     Review of Systems;  Patient denies headache, fevers, malaise, unintentional weight loss, skin rash, eye pain, sinus congestion and sinus pain, sore throat, dysphagia,  hemoptysis , cough, dyspnea, wheezing, chest pain, palpitations, orthopnea, edema, abdominal pain, nausea, melena, diarrhea, constipation, flank pain, dysuria, hematuria, urinary  Frequency, nocturia, numbness, tingling, seizures,  Focal weakness, Loss of consciousness,  Tremor, insomnia, depression, anxiety, and suicidal ideation.      Objective:  BP 138/74 (BP Location: Left Arm, Patient Position: Sitting, Cuff Size: Normal)   Pulse (!) 51   Temp (!) 97.3 F (36.3 C) (Oral)   Resp 14   Ht 5\' 5"  (1.651 m)   Wt 164 lb 3.2 oz (74.5 kg)   SpO2 97%   BMI 27.32 kg/m   BP Readings from Last 3 Encounters:  11/17/18 138/74  07/20/18 136/74  05/18/18 130/68    Wt Readings from Last 3 Encounters:  11/17/18 164 lb 3.2 oz (74.5 kg)  07/20/18 161 lb (73 kg)  05/18/18 165 lb (74.8 kg)    General appearance: alert, cooperative and appears stated age Ears: normal TM's and external ear canals both ears Throat: lips, mucosa, and tongue normal; teeth and gums normal Neck: no adenopathy, no carotid bruit, supple, symmetrical, trachea midline and thyroid not enlarged, symmetric, no tenderness/mass/nodules  Back: symmetric, no curvature. ROM normal. No CVA tenderness. Lungs: clear to auscultation bilaterally Heart: regular rate and rhythm, S1, S2 normal, no murmur, click, rub or gallop Abdomen: soft, non-tender; bowel sounds normal; no masses,  no organomegaly Pulses: 2+ and symmetric Skin: Skin color, texture, turgor normal. No rashes or lesions Lymph nodes: Cervical, supraclavicular, and axillary nodes normal.  No results found for: HGBA1C  Lab Results  Component Value Date   CREATININE 1.11 11/17/2018   CREATININE 1.15  05/18/2018   CREATININE 1.07 11/17/2017    Lab Results  Component Value Date   WBC 7.0 05/18/2018   HGB 14.8 05/18/2018   HCT 43.5 05/18/2018   PLT 162.0 05/18/2018   GLUCOSE 86 11/17/2018   CHOL 130 11/17/2018   TRIG 53.0 11/17/2018   HDL 44.20 11/17/2018   LDLCALC 76 11/17/2018   ALT 22 11/17/2018   AST 22 11/17/2018   NA 140 11/17/2018   K 5.1 11/17/2018   CL 108 11/17/2018   CREATININE 1.11 11/17/2018   BUN 12 11/17/2018   CO2 26 11/17/2018   TSH 3.92 05/18/2018   PSA 2.33 05/22/2015    US Abdomen Limited Ruq  Result Date: 05/27/2016 CLINICAL DATA:  Elevated alkaline phosphatase.  Weight loss. EXAM: US ABDOMEN LIMITED - RIGHT UPPER QUADRANT COMPARISON:  06/11/2015. FINDINGS: Gallbladder: No gallstones or wall thickening visualized. No sonographic Murphy sign noted by sonographer. Common bile duct: Diameter: 3.7 mm Liver: No focal abnormality. Previously identified hepatic cyst not identified on today's exam. Liver echotexture normal. IMPRESSION: No acute or focal abnormality. No evidence of gallstones or biliary distention.Previously identified small hepatic cyst not identified on today's exam. Electronically Signed   By: Marcello Moores  Register   On: 05/27/2016 09:32    Assessment & Plan:   Problem List Items Addressed This Visit    B12 deficiency - Primary   Relevant Medications   cyanocobalamin ((VITAMIN B-12)) injection 1,000 mcg (Completed)   Barrett's esophagus    Unclear when his last EGD was done by [facility].  Continue daily PPI,  Surveillance EGD needed.        Chronic midline thoracic back pain    Reviewed prior films done to rule out fractures. He has disk space narrowing,  Severe .at multiple levels with no signs of osteoporosis  Tylenol advised       Essential hypertension    Well controlled on current regimen. Renal function stable, no changes today.  Lab Results  Component Value Date   CREATININE 1.11 11/17/2018   Lab Results  Component Value Date    NA 140 11/17/2018   K 5.1 11/17/2018   CL 108 11/17/2018   CO2 26 11/17/2018         Relevant Orders   Comprehensive metabolic panel (Completed)   Sinus bradycardia on ECG    Persistent despite reducing his dose of cardizem to 120 mg daily for pulse < 55 and symptoms  suggestive of decreased cardiac output..  Advised to record readings at home   He is reluctant to make any changes to his medication regimen        Other Visit Diagnoses    Hyperlipidemia LDL goal <70       Relevant Orders   Lipid panel (Completed)      I am having Mitesh L. Lyness maintain his vitamin E, aspirin, calcium-vitamin D, sildenafil, nitroGLYCERIN, omeprazole, atorvastatin, CARTIA XT, and metoprolol tartrate. We administered cyanocobalamin.  Meds ordered this encounter  Medications  . cyanocobalamin ((VITAMIN B-12)) injection  1,000 mcg    Medications Discontinued During This Encounter  Medication Reason  . metoprolol tartrate (LOPRESSOR) 50 MG tablet Duplicate    Follow-up: Return in about 6 months (around 05/19/2019).   Crecencio Mc, MD

## 2018-11-18 ENCOUNTER — Encounter: Payer: Self-pay | Admitting: Internal Medicine

## 2018-11-18 NOTE — Assessment & Plan Note (Signed)
Unclear when his last EGD was done by [facility].  Continue daily PPI,  Surveillance EGD needed.

## 2018-11-18 NOTE — Assessment & Plan Note (Signed)
Well controlled on current regimen. Renal function stable, no changes today.  Lab Results  Component Value Date   CREATININE 1.11 11/17/2018   Lab Results  Component Value Date   NA 140 11/17/2018   K 5.1 11/17/2018   CL 108 11/17/2018   CO2 26 11/17/2018

## 2018-11-18 NOTE — Assessment & Plan Note (Signed)
Persistent despite reducing his dose of cardizem to 120 mg daily for pulse < 55 and symptoms  suggestive of decreased cardiac output..  Advised to record readings at home   He is reluctant to make any changes to his medication regimen

## 2018-11-18 NOTE — Assessment & Plan Note (Signed)
Reviewed prior films done to rule out fractures. He has disk space narrowing,  Severe .at multiple levels with no signs of osteoporosis  Tylenol advised  

## 2018-11-20 ENCOUNTER — Encounter: Payer: Self-pay | Admitting: *Deleted

## 2018-11-30 ENCOUNTER — Other Ambulatory Visit: Payer: Self-pay | Admitting: Internal Medicine

## 2018-12-19 ENCOUNTER — Ambulatory Visit (INDEPENDENT_AMBULATORY_CARE_PROVIDER_SITE_OTHER): Payer: Medicare Other

## 2018-12-19 DIAGNOSIS — E538 Deficiency of other specified B group vitamins: Secondary | ICD-10-CM

## 2018-12-19 MED ORDER — CYANOCOBALAMIN 1000 MCG/ML IJ SOLN
1000.0000 ug | Freq: Once | INTRAMUSCULAR | Status: AC
  Administered 2018-12-19: 1000 ug via INTRAMUSCULAR

## 2018-12-19 NOTE — Progress Notes (Signed)
Patient came into for B-12 injection in LD. Patient tolerated well.

## 2019-01-23 ENCOUNTER — Ambulatory Visit (INDEPENDENT_AMBULATORY_CARE_PROVIDER_SITE_OTHER): Payer: Medicare Other

## 2019-01-23 DIAGNOSIS — E538 Deficiency of other specified B group vitamins: Secondary | ICD-10-CM | POA: Diagnosis not present

## 2019-01-23 MED ORDER — CYANOCOBALAMIN 1000 MCG/ML IJ SOLN
1000.0000 ug | Freq: Once | INTRAMUSCULAR | Status: AC
  Administered 2019-01-23: 1000 ug via INTRAMUSCULAR

## 2019-01-23 NOTE — Progress Notes (Addendum)
Patient presented for B 12 injection to right deltoid, patient voiced no concerns nor showed any signs of distress during injection.   I have reviewed the above information and agree with above.   Deborra Medina, MD

## 2019-02-02 ENCOUNTER — Other Ambulatory Visit: Payer: Self-pay | Admitting: Internal Medicine

## 2019-02-05 ENCOUNTER — Other Ambulatory Visit: Payer: Self-pay | Admitting: Internal Medicine

## 2019-02-05 MED ORDER — DILTIAZEM HCL ER COATED BEADS 120 MG PO CP24: 120.0000 mg | ORAL_CAPSULE | Freq: Every day | ORAL | 1 refills | Status: DC

## 2019-02-05 MED ORDER — METOPROLOL TARTRATE 50 MG PO TABS: 50.0000 mg | ORAL_TABLET | Freq: Every day | ORAL | 1 refills | Status: DC

## 2019-02-05 NOTE — Telephone Encounter (Signed)
Copied from [place] [id number]. Topic: Quick Communication - Rx Refill/Question >> Feb 05, 2019 10:25 AM Waylan Rocher, Lumin L wrote: Medication: metoprolol 50mg , CARTIA XT 120 MG 24 hr capsule  Has the patient contacted their pharmacy? Yes.   (Agent: If no, request that the patient contact the pharmacy for the refill.) (Agent: If yes, when and what did the pharmacy advise?)  Preferred Pharmacy (with phone number or street name): [place], [place] [address] Phone: [phone] Fax: [phone]  Agent: Please be advised that RX refills may take up to 3 business days. We ask that you follow-up with your pharmacy.

## 2019-02-13 ENCOUNTER — Telehealth: Payer: Self-pay | Admitting: Internal Medicine

## 2019-02-13 ENCOUNTER — Other Ambulatory Visit: Payer: Self-pay

## 2019-02-13 MED ORDER — DILTIAZEM HCL ER COATED BEADS 120 MG PO CP24: 120.0000 mg | ORAL_CAPSULE | Freq: Every day | ORAL | 1 refills | Status: DC

## 2019-02-13 NOTE — Telephone Encounter (Signed)
Patient notified that prescription was sent last week on the 16th, but I notified him that I sent this again.

## 2019-02-13 NOTE — Telephone Encounter (Signed)
Copied from [place] [id number]. Topic: Quick Communication - Rx Refill/Question >> Feb 13, 2019 11:53 AM Virl Axe D wrote: Medication: diltiazem (CARTIA XT) 120 MG 24 hr capsule   Has the patient contacted their pharmacy? Yes.   (Agent: If no, request that the patient contact the pharmacy for the refill.) (Agent: If yes, when and what did the pharmacy advise?)  Preferred Pharmacy (with phone number or street name): [place], [place] [phone] (Phone) [phone] (Fax)    Agent: Please be advised that RX refills may take up to 3 business days. We ask that you follow-up with your pharmacy.

## 2019-02-27 ENCOUNTER — Other Ambulatory Visit: Payer: Self-pay

## 2019-02-27 ENCOUNTER — Ambulatory Visit (INDEPENDENT_AMBULATORY_CARE_PROVIDER_SITE_OTHER): Payer: Medicare Other

## 2019-02-27 DIAGNOSIS — E538 Deficiency of other specified B group vitamins: Secondary | ICD-10-CM | POA: Diagnosis not present

## 2019-02-27 MED ORDER — CYANOCOBALAMIN 1000 MCG/ML IJ SOLN
1000.0000 ug | Freq: Once | INTRAMUSCULAR | Status: AC
  Administered 2019-02-27: 1000 ug via INTRAMUSCULAR

## 2019-02-27 NOTE — Progress Notes (Signed)
John Burns presents today for injection per MD orders. °B12 injection administered IM in right Upper Arm. °Administration without incident. °Patient tolerated well.  Nina,cma  ° °

## 2019-04-03 ENCOUNTER — Other Ambulatory Visit: Payer: Self-pay

## 2019-04-03 ENCOUNTER — Ambulatory Visit (INDEPENDENT_AMBULATORY_CARE_PROVIDER_SITE_OTHER): Payer: Medicare Other | Admitting: *Deleted

## 2019-04-03 DIAGNOSIS — E538 Deficiency of other specified B group vitamins: Secondary | ICD-10-CM | POA: Diagnosis not present

## 2019-04-03 MED ORDER — CYANOCOBALAMIN 1000 MCG/ML IJ SOLN
1000.0000 ug | Freq: Once | INTRAMUSCULAR | Status: AC
  Administered 2019-04-03: 1000 ug via INTRAMUSCULAR

## 2019-04-03 NOTE — Progress Notes (Addendum)
Patient presented for B 12 injection to right deltoid, patient voiced no concerns nor showed any signs of distress during injection.  Reviewed.  Dr Scott 

## 2019-04-12 DIAGNOSIS — I208 Other forms of angina pectoris: Secondary | ICD-10-CM | POA: Diagnosis not present

## 2019-04-12 DIAGNOSIS — Z9889 Other specified postprocedural states: Secondary | ICD-10-CM | POA: Diagnosis not present

## 2019-04-12 DIAGNOSIS — E785 Hyperlipidemia, unspecified: Secondary | ICD-10-CM | POA: Diagnosis not present

## 2019-04-12 DIAGNOSIS — I2510 Atherosclerotic heart disease of native coronary artery without angina pectoris: Secondary | ICD-10-CM | POA: Diagnosis not present

## 2019-04-12 DIAGNOSIS — R079 Chest pain, unspecified: Secondary | ICD-10-CM | POA: Diagnosis not present

## 2019-04-12 DIAGNOSIS — I25708 Atherosclerosis of coronary artery bypass graft(s), unspecified, with other forms of angina pectoris: Secondary | ICD-10-CM | POA: Diagnosis not present

## 2019-04-12 DIAGNOSIS — I471 Supraventricular tachycardia: Secondary | ICD-10-CM | POA: Diagnosis not present

## 2019-04-12 DIAGNOSIS — J449 Chronic obstructive pulmonary disease, unspecified: Secondary | ICD-10-CM | POA: Diagnosis not present

## 2019-04-12 DIAGNOSIS — R0602 Shortness of breath: Secondary | ICD-10-CM | POA: Diagnosis not present

## 2019-04-12 DIAGNOSIS — Z951 Presence of aortocoronary bypass graft: Secondary | ICD-10-CM | POA: Diagnosis not present

## 2019-05-04 ENCOUNTER — Telehealth: Payer: Self-pay

## 2019-05-04 NOTE — Telephone Encounter (Signed)
What crm? I don't see one.

## 2019-05-04 NOTE — Telephone Encounter (Signed)
John Burns it due to this update some has not transferred over. I have fixed it but im not sure about the message

## 2019-05-04 NOTE — Telephone Encounter (Signed)
Please see crm

## 2019-05-08 ENCOUNTER — Other Ambulatory Visit: Payer: Self-pay

## 2019-05-08 ENCOUNTER — Ambulatory Visit (INDEPENDENT_AMBULATORY_CARE_PROVIDER_SITE_OTHER): Payer: Medicare Other

## 2019-05-08 ENCOUNTER — Other Ambulatory Visit: Payer: Self-pay | Admitting: Internal Medicine

## 2019-05-08 DIAGNOSIS — E538 Deficiency of other specified B group vitamins: Secondary | ICD-10-CM | POA: Diagnosis not present

## 2019-05-08 MED ORDER — CYANOCOBALAMIN 1000 MCG/ML IJ SOLN
1000.0000 ug | Freq: Once | INTRAMUSCULAR | Status: AC
  Administered 2019-05-08: 1000 ug via INTRAMUSCULAR

## 2019-05-08 NOTE — Progress Notes (Signed)
John Burns presents today for injection per MD orders. °B12 injection administered IM in right Upper Arm. °Administration without incident. °Patient tolerated well.  Nina,cma  ° °

## 2019-05-21 ENCOUNTER — Other Ambulatory Visit: Payer: Self-pay

## 2019-05-21 ENCOUNTER — Ambulatory Visit (INDEPENDENT_AMBULATORY_CARE_PROVIDER_SITE_OTHER): Payer: Medicare Other | Admitting: Internal Medicine

## 2019-05-21 ENCOUNTER — Encounter: Payer: Self-pay | Admitting: Internal Medicine

## 2019-05-21 DIAGNOSIS — I2510 Atherosclerotic heart disease of native coronary artery without angina pectoris: Secondary | ICD-10-CM | POA: Diagnosis not present

## 2019-05-21 DIAGNOSIS — R413 Other amnesia: Secondary | ICD-10-CM | POA: Diagnosis not present

## 2019-05-21 DIAGNOSIS — I10 Essential (primary) hypertension: Secondary | ICD-10-CM | POA: Diagnosis not present

## 2019-05-21 DIAGNOSIS — R5383 Other fatigue: Secondary | ICD-10-CM

## 2019-05-21 DIAGNOSIS — R748 Abnormal levels of other serum enzymes: Secondary | ICD-10-CM | POA: Diagnosis not present

## 2019-05-21 DIAGNOSIS — G3184 Mild cognitive impairment, so stated: Secondary | ICD-10-CM | POA: Diagnosis not present

## 2019-05-21 DIAGNOSIS — F0150 Vascular dementia without behavioral disturbance: Secondary | ICD-10-CM | POA: Insufficient documentation

## 2019-05-21 MED ORDER — OMEPRAZOLE 20 MG PO CPDR: 20.0000 mg | DELAYED_RELEASE_CAPSULE | Freq: Every day | ORAL | 1 refills | Status: DC

## 2019-05-21 NOTE — Assessment & Plan Note (Signed)
Well controlled on current regimen. Renal function assessment is due, no changes today.  Lab Results  Component Value Date   CREATININE 1.11 11/17/2018   Lab Results  Component Value Date   NA 140 11/17/2018   K 5.1 11/17/2018   CL 108 11/17/2018   CO2 26 11/17/2018

## 2019-05-21 NOTE — Assessment & Plan Note (Signed)
Formal assessment was not possible during telephone assessment but deficits were noted on a modified MMSE and his wife has noticed a decline in the past year.  He has ASCVD. 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  Checking for reversible causes and checking MRI brain

## 2019-05-21 NOTE — Progress Notes (Signed)
Telephone Note  This visit type was conducted due to national recommendations for restrictions regarding the COVID-19 pandemic (e.g. social distancing).  This format is felt to be most appropriate for this patient at this time.  All issues noted in this document were discussed and addressed.  No physical exam was performed (except for noted visual exam findings with Video Visits).   I connected with@ on 05/21/19 at  8:30 AM EDT by  telephone and verified that I am speaking with the correct person using two identifiers. Location patient: home Location provider: work or home office Persons participating in the virtual visit: patient, provider and wife   I discussed the limitations, risks, security and privacy concerns of performing an evaluation and management service by telephone and the availability of in person appointments. I also discussed with the patient that there may be a patient responsible charge related to this service. The patient expressed understanding and agreed to proceed.  Reason for visit: 6 month follow up on  Essential hypertension. Hyperlipidemia with known CAD,  SVT managed with metoprolol   HPI:   78 yr old with above mentioned issues presents for follow up   The patient has no signs or symptoms of COVID 19 infection (fever, cough, sore throat  or shortness of breath beyond what is typical for patient).  Patient denies contact with other persons with the above mentioned symptoms or with anyone confirmed to have COVID 19   Cc:  MEMORY IMPAIRMENT.  Patient's wife reports that the patient has been having trouble remembering dates and details for nearly a year.  Repeatedly asking the same question multiple times per day .   Uses a pill box .  Wife has always balanced the checkbook and paid  the bills .  Sleeping well.  12 th grade education     BP 138/63 PULSE 52 pulse ranges to to 60   Fatigue but no dyspnea or chest pain   ROS: See pertinent positives and negatives  per HPI.  Past Medical History:  Diagnosis Date  . ASCVD (arteriosclerotic cardiovascular disease)   . Bladder neck obstruction   . CAD (coronary artery disease) 2007   s/p PCI to mid LAD  . COPD (chronic obstructive pulmonary disease) ([place])   . Diverticulosis   . GERD (gastroesophageal reflux disease)   . Heart attack ([place])   . History of hiatal hernia   . History of tobacco abuse   . HOH (hard of hearing)   . Hypercholesteremia   . Hypertension   . Shortness of breath dyspnea    on exertion  . Stenosis of coronary stent 2013    Past Surgical History:  Procedure Laterality Date  . ANGIOPLASTY / STENTING FEMORAL    . CARDIAC CATHETERIZATION  05/10/12   patent LAD stent w/ 50% in stent restenosis; 70% stenosis ostium of left circumflex; 75% stenosis mid left circumflex  . CATARACT EXTRACTION W/PHACO Left 12/29/2015   Procedure: CATARACT EXTRACTION PHACO AND INTRAOCULAR LENS PLACEMENT (IOC);  Surgeon: Estill Cotta, MD;  Location: [facility] ORS;  Service: Ophthalmology;  Laterality: Left;  [place]: 01:53.8 AP%: 26.2 CDE: 49.38 Lot # [id number]  . CATARACT EXTRACTION W/PHACO Right 01/26/2016   Procedure: CATARACT EXTRACTION PHACO AND INTRAOCULAR LENS PLACEMENT (IOC);  Surgeon: Estill Cotta, MD;  Location: [facility] ORS;  Service: Ophthalmology;  Laterality: Right;  [place] 01:13 AP% 25.3 CDE 33.95 fluid pack lot # [id number]  . COLONOSCOPY  02/05/14  . CORONARY ARTERY BYPASS GRAFT  06/04/14   x2  [place]  2007  . ESOPHAGOGASTRODUODENOSCOPY  02/05/14    Family History  Problem Relation Age of Onset  . Heart disease Mother   . Cancer Father 70       prostate and colon cancer    SOCIAL HX:  reports that he quit smoking about 25 years ago. His smoking use included cigarettes. He has a 50.00 pack-year smoking history. He has quit using smokeless tobacco.  His smokeless tobacco use included chew. He reports that he does not drink alcohol or use drugs.  Current  Outpatient Medications:  .  aspirin 81 MG tablet, Take 1 tablet (81 mg total) by mouth daily., Disp: 90 tablet, Rfl: 1 .  atorvastatin (LIPITOR) 20 MG tablet, TAKE 1 TABLET BY MOUTH ONCE DAILY AT 6 PM, Disp: 90 tablet, Rfl: 1 .  Calcium Carb-Cholecalciferol (CALCIUM-VITAMIN D) 500-200 MG-UNIT tablet, Take 1 tablet by mouth daily., Disp: , Rfl:  .  diltiazem (CARTIA XT) 120 MG 24 hr capsule, Take 1 capsule (120 mg total) by mouth daily., Disp: 90 capsule, Rfl: 1 .  metoprolol tartrate (LOPRESSOR) 50 MG tablet, Take 1 tablet (50 mg total) by mouth daily., Disp: 90 tablet, Rfl: 1 .  nitroGLYCERIN (NITROSTAT) 0.4 MG SL tablet, Place 1 tablet (0.4 mg total) under the tongue every 5 (five) minutes as needed for chest pain., Disp: 50 tablet, Rfl: 3 .  omeprazole (PRILOSEC) 20 MG capsule, Take 1 capsule (20 mg total) by mouth daily., Disp: 90 capsule, Rfl: 1 .  sildenafil (VIAGRA) 100 MG tablet, TAKE 1 TABLET BY MOUTH ONCE DAILY AS NEEDED FOR ERECTILE DYSFUNCTION, Disp: , Rfl:  .  vitamin E 400 UNIT capsule, Take 400 Units by mouth daily., Disp: , Rfl:   EXAM:   General impression: alert, cooperative and articulate.  No signs of being in distress  Lungs: speech is fluent sentence length suggests that patient is not short of breath and not punctuated by cough, sneezing or sniffing. Marland Kitchen   Psych: affect normal.  speech is articulate and non pressured .  Denies suicidal thoughts  MMSE:  Monday/June (?) 2020 How many qtrs in $1.50?  6 WORLD : COULDN'T SPELL IT BACKWARDS 3 step command not completed    ASSESSMENT AND PLAN:  Mild cognitive impairment with memory loss Formal assessment was not possible during telephone assessment but deficits were noted on a modified MMSE and his wife has noticed a decline in the past year.  He has ASCVD. 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  Checking for reversible causes and checking MRI brain  Essential hypertension Well controlled on current regimen. Renal function assessment  is due, no changes today.  Lab Results  Component Value Date   CREATININE 1.11 11/17/2018   Lab Results  Component Value Date   NA 140 11/17/2018   K 5.1 11/17/2018   CL 108 11/17/2018   CO2 26 11/17/2018     Arteriosclerosis of coronary artery LDL and triglycerides are at goal on current medications. He is asymptomatic,  Is tolerating his medications without  side effects and liver enzymes are due for assessment . No changes today.  Lab Results  Component Value Date   CHOL 130 11/17/2018   HDL 44.20 11/17/2018   LDLCALC 76 11/17/2018   TRIG 53.0 11/17/2018   CHOLHDL 3 11/17/2018   Lab Results  Component Value Date   ALT 22 11/17/2018   AST 22 11/17/2018   ALKPHOS 104 11/17/2018   BILITOT 0.6 11/17/2018  Elevated alkaline phosphatase level  Abdominal ultrasound was normal July 2017 . no further workup was done as alk phos normalized.   Continue statin therapy for known CAD .    Lab Results  Component Value Date   ALT 22 11/17/2018   AST 22 11/17/2018   ALKPHOS 104 11/17/2018   BILITOT 0.6 11/17/2018         I discussed the assessment and treatment plan with the patient. The patient was provided an opportunity to ask questions and all were answered. The patient agreed with the plan and demonstrated an understanding of the instructions.   The patient was advised to call back or seek an in-person evaluation if the symptoms worsen or if the condition fails to improve as anticipated.  I provided 25 minutes of non-face-to-face time during this encounter.   Crecencio Mc, MD

## 2019-05-21 NOTE — Progress Notes (Signed)
Pt and wife would like to discuss his memory and maybe starting a medication to help with it.

## 2019-05-21 NOTE — Assessment & Plan Note (Addendum)
Abdominal ultrasound was normal July 2017 . no further workup was done as alk phos normalized.   Continue statin therapy for known CAD .    Lab Results  Component Value Date   ALT 22 11/17/2018   AST 22 11/17/2018   ALKPHOS 104 11/17/2018   BILITOT 0.6 11/17/2018

## 2019-05-21 NOTE — Assessment & Plan Note (Signed)
LDL and triglycerides are at goal on current medications. He is asymptomatic,  Is tolerating his medications without  side effects and liver enzymes are due for assessment . No changes today.  Lab Results  Component Value Date   CHOL 130 11/17/2018   HDL 44.20 11/17/2018   LDLCALC 76 11/17/2018   TRIG 53.0 11/17/2018   CHOLHDL 3 11/17/2018   Lab Results  Component Value Date   ALT 22 11/17/2018   AST 22 11/17/2018   ALKPHOS 104 11/17/2018   BILITOT 0.6 11/17/2018

## 2019-05-23 DIAGNOSIS — Z872 Personal history of diseases of the skin and subcutaneous tissue: Secondary | ICD-10-CM | POA: Diagnosis not present

## 2019-05-23 DIAGNOSIS — L570 Actinic keratosis: Secondary | ICD-10-CM | POA: Diagnosis not present

## 2019-05-23 DIAGNOSIS — L578 Other skin changes due to chronic exposure to nonionizing radiation: Secondary | ICD-10-CM | POA: Diagnosis not present

## 2019-05-29 ENCOUNTER — Other Ambulatory Visit: Payer: Self-pay

## 2019-05-29 ENCOUNTER — Other Ambulatory Visit (INDEPENDENT_AMBULATORY_CARE_PROVIDER_SITE_OTHER): Payer: Medicare Other

## 2019-05-29 DIAGNOSIS — I2510 Atherosclerotic heart disease of native coronary artery without angina pectoris: Secondary | ICD-10-CM | POA: Diagnosis not present

## 2019-05-29 DIAGNOSIS — R413 Other amnesia: Secondary | ICD-10-CM | POA: Diagnosis not present

## 2019-05-29 DIAGNOSIS — R5383 Other fatigue: Secondary | ICD-10-CM | POA: Diagnosis not present

## 2019-05-29 LAB — CBC WITH DIFFERENTIAL/PLATELET
Basophils Absolute: 0.1 10*3/uL (ref 0.0–0.1)
Basophils Relative: 1 % (ref 0.0–3.0)
Eosinophils Absolute: 0.3 10*3/uL (ref 0.0–0.7)
Eosinophils Relative: 4.6 % (ref 0.0–5.0)
HCT: 43.5 % (ref 39.0–52.0)
Hemoglobin: 14.5 g/dL (ref 13.0–17.0)
Lymphocytes Relative: 19.9 % (ref 12.0–46.0)
Lymphs Abs: 1.1 10*3/uL (ref 0.7–4.0)
MCHC: 33.3 g/dL (ref 30.0–36.0)
MCV: 95.6 fl (ref 78.0–100.0)
Monocytes Absolute: 0.7 10*3/uL (ref 0.1–1.0)
Monocytes Relative: 11.9 % (ref 3.0–12.0)
Neutro Abs: 3.5 10*3/uL (ref 1.4–7.7)
Neutrophils Relative %: 62.6 % (ref 43.0–77.0)
Platelets: 140 10*3/uL — ABNORMAL LOW (ref 150.0–400.0)
RBC: 4.55 Mil/uL (ref 4.22–5.81)
RDW: 13.6 % (ref 11.5–15.5)
WBC: 5.5 10*3/uL (ref 4.0–10.5)

## 2019-05-29 LAB — LIPID PANEL
Cholesterol: 126 mg/dL (ref 0–200)
HDL: 47.9 mg/dL (ref >39.00)
LDL Cholesterol: 67 mg/dL (ref 0–99)
NonHDL: 77.92
Total CHOL/HDL Ratio: 3
Triglycerides: 56 mg/dL (ref 0.0–149.0)
VLDL: 11.2 mg/dL (ref 0.0–40.0)

## 2019-05-29 LAB — COMPREHENSIVE METABOLIC PANEL
ALT: 16 U/L (ref 0–53)
AST: 21 U/L (ref 0–37)
Albumin: 4.1 g/dL (ref 3.5–5.2)
Alkaline Phosphatase: 115 U/L (ref 39–117)
BUN: 17 mg/dL (ref 6–23)
CO2: 27 mEq/L (ref 19–32)
Calcium: 9.5 mg/dL (ref 8.4–10.5)
Chloride: 108 mEq/L (ref 96–112)
Creatinine, Ser: 1.13 mg/dL (ref 0.40–1.50)
GFR: 62.67 mL/min (ref >60.00)
Glucose, Bld: 92 mg/dL (ref 70–99)
Potassium: 4.8 mEq/L (ref 3.5–5.1)
Sodium: 141 mEq/L (ref 135–145)
Total Bilirubin: 0.8 mg/dL (ref 0.2–1.2)
Total Protein: 6.2 g/dL (ref 6.0–8.3)

## 2019-05-29 LAB — VITAMIN B12: Vitamin B-12: 480 pg/mL (ref 211–911)

## 2019-05-29 LAB — TSH: TSH: 5.63 u[IU]/mL — ABNORMAL HIGH (ref 0.35–4.50)

## 2019-05-30 LAB — RPR: RPR Ser Ql: NONREACTIVE

## 2019-05-31 ENCOUNTER — Other Ambulatory Visit: Payer: Self-pay | Admitting: Internal Medicine

## 2019-05-31 DIAGNOSIS — E039 Hypothyroidism, unspecified: Secondary | ICD-10-CM

## 2019-05-31 DIAGNOSIS — R7989 Other specified abnormal findings of blood chemistry: Secondary | ICD-10-CM

## 2019-06-07 ENCOUNTER — Other Ambulatory Visit: Payer: Self-pay

## 2019-06-07 ENCOUNTER — Ambulatory Visit
Admission: RE | Admit: 2019-06-07 | Discharge: 2019-06-07 | Disposition: A | Discharge status: Home / Self Care | Payer: Medicare Other | Source: Ambulatory Visit | Attending: Internal Medicine | Admitting: Internal Medicine

## 2019-06-07 DIAGNOSIS — R413 Other amnesia: Secondary | ICD-10-CM | POA: Insufficient documentation

## 2019-06-11 ENCOUNTER — Other Ambulatory Visit: Payer: Self-pay | Admitting: Internal Medicine

## 2019-06-11 NOTE — Telephone Encounter (Signed)
atorvastatin (LIPITOR) 20 MG tablet  [place], [place] [phone] (Phone) [phone] (Fax)   Last visit in June 2020 pls refill

## 2019-06-11 NOTE — Telephone Encounter (Signed)
Notified patient medication sent electronically on 05/09/19, patient had not checked with pharmacy for medication.

## 2019-06-12 ENCOUNTER — Ambulatory Visit (INDEPENDENT_AMBULATORY_CARE_PROVIDER_SITE_OTHER): Payer: Medicare Other

## 2019-06-12 ENCOUNTER — Other Ambulatory Visit: Payer: Self-pay

## 2019-06-12 ENCOUNTER — Ambulatory Visit: Payer: Medicare Other

## 2019-06-12 DIAGNOSIS — E538 Deficiency of other specified B group vitamins: Secondary | ICD-10-CM

## 2019-06-12 MED ORDER — CYANOCOBALAMIN 1000 MCG/ML IJ SOLN
1000.0000 ug | Freq: Once | INTRAMUSCULAR | Status: AC
  Administered 2019-06-12: 16:00:00 1000 ug via INTRAMUSCULAR

## 2019-06-12 NOTE — Progress Notes (Signed)
Patient presented today for B12 injection.  Administered IM in left deltoid.  Patient tolerated well with no signs of distress.   

## 2019-06-17 DIAGNOSIS — G3184 Mild cognitive impairment, so stated: Secondary | ICD-10-CM

## 2019-06-20 NOTE — Telephone Encounter (Signed)
Called and spoke with pt's wife(on DPR) and informed her of the pt's MRI results. The wife stated that the pt has not seen a neurologist so we went ahead and scheduled the pt for a office visit with you. The wife is wanting to see if they can do an actual in office appt with you so they can both hear what you have to say about everything. Also left a message with the daughter to let her know that the pt's wife has been notified of the MRI results and an appt has been scheduled to discuss.

## 2019-06-20 NOTE — Telephone Encounter (Signed)
I spoke called and spoke to John Burns, pt's daughter on Monday to schedule a virtual visit to discuss MRI results per Dr. Lupita Dawn MyChart message.  Pt's daughter wanted to know why pt had not been contacted with MRI results.  Informed pt's daughter that MRI results had been sent through a MyChart message.  Pt's daughter said that MyChart was only set up so that she could have access to pt's tests results but office should contact pt directly by phone to give results and schedule appt since pt does not have access to electronics.  Pt's daughter was informed that Dr. Derrel Nip had been out-of-the-office since last Thursday afternoon and would not return until Tuesday.  Pt's daughter called back again today and spoke with [facility] operator wanting to know why pt has not been contacted yet with results.  Call was disconnected with [facility] operator tried to transfer call.  Returned pt's call.  No answer.  Left detailed voice message (ok per DPR) stating that her MyChart message concerning MRI test results and appt had been forwarded to Dr. Derrel Nip on yesterday and reminded pt's daughter that Dr. Derrel Nip just returned to office on yesterday.

## 2019-06-29 ENCOUNTER — Other Ambulatory Visit: Payer: Self-pay

## 2019-07-03 ENCOUNTER — Other Ambulatory Visit: Payer: Self-pay

## 2019-07-03 ENCOUNTER — Ambulatory Visit (INDEPENDENT_AMBULATORY_CARE_PROVIDER_SITE_OTHER): Payer: Medicare Other | Admitting: Internal Medicine

## 2019-07-03 ENCOUNTER — Encounter: Payer: Self-pay | Admitting: Internal Medicine

## 2019-07-03 VITALS — BP 136/68 | HR 59 | Temp 98.4°F | Resp 16 | Ht 65.0 in | Wt 159.6 lb

## 2019-07-03 DIAGNOSIS — I10 Essential (primary) hypertension: Secondary | ICD-10-CM

## 2019-07-03 DIAGNOSIS — R7989 Other specified abnormal findings of blood chemistry: Secondary | ICD-10-CM

## 2019-07-03 DIAGNOSIS — G8929 Other chronic pain: Secondary | ICD-10-CM | POA: Diagnosis not present

## 2019-07-03 DIAGNOSIS — Z8673 Personal history of transient ischemic attack (TIA), and cerebral infarction without residual deficits: Secondary | ICD-10-CM | POA: Diagnosis not present

## 2019-07-03 DIAGNOSIS — M546 Pain in thoracic spine: Secondary | ICD-10-CM

## 2019-07-03 DIAGNOSIS — G3184 Mild cognitive impairment, so stated: Secondary | ICD-10-CM | POA: Diagnosis not present

## 2019-07-03 DIAGNOSIS — I2510 Atherosclerotic heart disease of native coronary artery without angina pectoris: Secondary | ICD-10-CM

## 2019-07-03 DIAGNOSIS — E039 Hypothyroidism, unspecified: Secondary | ICD-10-CM | POA: Diagnosis not present

## 2019-07-03 MED ORDER — TELMISARTAN 20 MG PO TABS: 20.0000 mg | ORAL_TABLET | Freq: Every day | ORAL | 5 refills | Status: DC

## 2019-07-03 MED ORDER — DONEPEZIL HCL 5 MG PO TABS: 5.0000 mg | ORAL_TABLET | Freq: Every day | ORAL | 5 refills | Status: DC

## 2019-07-03 NOTE — Patient Instructions (Signed)
For your back pain:   You can take up to 2000 mg of acetominophen (tylenol) every day safely  In divided doses (500 mg every 6 hours  Or 1000 mg every 12 hours.)   I am starting you on generic Aricpept   I am also adding telmisartan for your blood pressure  You can take them both in the evening with your metoprolol

## 2019-07-03 NOTE — Progress Notes (Signed)
Subjective:  Patient ID: John Burns, male    DOB: 04/11/1941  Age: 78 y.o. MRN: [id number]  CC: The primary encounter diagnosis was Abnormal TSH. Diagnoses of Acquired hypothyroidism, Mild cognitive impairment with memory loss, and History of lacunar cerebrovascular accident were also pertinent to this visit.  HPI John Burns presents for discussion of results of MRI Brain done to evaluate short term memory loss noted  noticed by family.  He feels generally well except for chronic back pain .  He has not been using tylenol  Maximally.   For the last year or so patient has been repeating himself and "asking the same questions"  Repeatedly  Per wife and daughter,  He denies getting lost,  forgetting to take his pills,  Trouble with balance, trouble in the kitchen , and trouble sleeping.  MRI brain reviewed: lacunar CVAs and amyloid angiopathy explained.    Discussed BP control.    Outpatient Medications Prior to Visit  Medication Sig Dispense Refill  . aspirin 81 MG tablet Take 1 tablet (81 mg total) by mouth daily. 90 tablet 1  . atorvastatin (LIPITOR) 20 MG tablet TAKE 1 TABLET BY MOUTH ONCE DAILY AT 6 PM 90 tablet 1  . Calcium Carb-Cholecalciferol (CALCIUM-VITAMIN D) 500-200 MG-UNIT tablet Take 1 tablet by mouth daily.    Marland Kitchen diltiazem (CARTIA XT) 120 MG 24 hr capsule Take 1 capsule (120 mg total) by mouth daily. 90 capsule 1  . metoprolol tartrate (LOPRESSOR) 50 MG tablet Take 1 tablet (50 mg total) by mouth daily. 90 tablet 1  . nitroGLYCERIN (NITROSTAT) 0.4 MG SL tablet Place 1 tablet (0.4 mg total) under the tongue every 5 (five) minutes as needed for chest pain. 50 tablet 3  . omeprazole (PRILOSEC) 20 MG capsule Take 1 capsule (20 mg total) by mouth daily. 90 capsule 1  . sildenafil (VIAGRA) 100 MG tablet TAKE 1 TABLET BY MOUTH ONCE DAILY AS NEEDED FOR ERECTILE DYSFUNCTION    . vitamin E 400 UNIT capsule Take 400 Units by mouth daily.     No facility-administered medications  prior to visit.     Review of Systems;  Patient denies headache, fevers, malaise, unintentional weight loss, skin rash, eye pain, sinus congestion and sinus pain, sore throat, dysphagia,  hemoptysis , cough, dyspnea, wheezing, chest pain, palpitations, orthopnea, edema, abdominal pain, nausea, melena, diarrhea, constipation, flank pain, dysuria, hematuria, urinary  Frequency, nocturia, numbness, tingling, seizures,  Focal weakness, Loss of consciousness,  Tremor, insomnia, depression, anxiety, and suicidal ideation.      Objective:  BP 136/68 (BP Location: Left Arm, Patient Position: Sitting, Cuff Size: Normal)   Pulse (!) 59   Temp 98.4 F (36.9 C) (Oral)   Resp 16   Ht 5\' 5"  (1.651 m)   Wt 159 lb 9.6 oz (72.4 kg)   SpO2 97%   BMI 26.56 kg/m   BP Readings from Last 3 Encounters:  07/03/19 136/68  11/17/18 138/74  07/20/18 136/74    Wt Readings from Last 3 Encounters:  07/03/19 159 lb 9.6 oz (72.4 kg)  11/17/18 164 lb 3.2 oz (74.5 kg)  07/20/18 161 lb (73 kg)    General appearance: alert, cooperative and appears stated age Ears: normal TM's and external ear canals both ears Throat: lips, mucosa, and tongue normal; teeth and gums normal Neck: no adenopathy, no carotid bruit, supple, symmetrical, trachea midline and thyroid not enlarged, symmetric, no tenderness/mass/nodules Back: symmetric, no curvature. ROM normal. No CVA tenderness. Lungs: clear  to auscultation bilaterally Heart: regular rate and rhythm, S1, S2 normal, no murmur, click, rub or gallop Abdomen: soft, non-tender; bowel sounds normal; no masses,  no organomegaly Pulses: 2+ and symmetric Skin: Skin color, texture, turgor normal. No rashes or lesions Lymph nodes: Cervical, supraclavicular, and axillary nodes normal.  No results found for: HGBA1C  Lab Results  Component Value Date   CREATININE 1.13 05/29/2019   CREATININE 1.11 11/17/2018   CREATININE 1.15 05/18/2018    Lab Results  Component Value  Date   WBC 5.5 05/29/2019   HGB 14.5 05/29/2019   HCT 43.5 05/29/2019   PLT 140.0 (L) 05/29/2019   GLUCOSE 92 05/29/2019   CHOL 126 05/29/2019   TRIG 56.0 05/29/2019   HDL 47.90 05/29/2019   LDLCALC 67 05/29/2019   ALT 16 05/29/2019   AST 21 05/29/2019   NA 141 05/29/2019   K 4.8 05/29/2019   CL 108 05/29/2019   CREATININE 1.13 05/29/2019   BUN 17 05/29/2019   CO2 27 05/29/2019   TSH 2.77 07/03/2019   PSA 2.33 05/22/2015    Mr Brain Wo Contrast  Result Date: 06/08/2019 CLINICAL DATA:  Memory loss for 1 year EXAM: MRI HEAD WITHOUT CONTRAST TECHNIQUE: Multiplanar, multiecho pulse sequences of the brain and surrounding structures were obtained without intravenous contrast. COMPARISON:  None. FINDINGS: Brain: No recent infarction, hemorrhage, hydrocephalus, extra-axial collection or mass lesion. Age normal brain volume Moderate FLAIR hyperintensity in the cerebral white matter with Lacunes seen asymmetrically on the right. At least 4 remote micro hemorrhages superficially in the bilateral cerebrum. Small remote posterior right frontal cortex infarct. Vascular: Major flow voids are preserved Skull and upper cervical spine: Curved negative for marrow lesion Sinuses/Orbits: Bilateral cataract resection IMPRESSION: 1. No specific or reversible finding to explain memory loss. 2. Moderate white matter disease including remote lacunar infarcts. 3. Few remote lobar micro hemorrhages, early amyloid angiopathy is a consideration. Electronically Signed   By: Monte Fantasia M.D.   On: 06/08/2019 10:24    Assessment & Plan:   Problem List Items Addressed This Visit      Unprioritized   Mild cognitive impairment with memory loss    Diagnosis of dementia supported by changes seen on brain MRI; both acquired and alzheimers suspected .  He has agreed to a trial of aricept       History of lacunar cerebrovascular accident    Noted on brain MRI.  Discussed BP control,  With goal 120/70       Other  Visit Diagnoses    Abnormal TSH    -  Primary   Acquired hypothyroidism       Relevant Orders   TSH (Completed)   T4, free (Completed)      I am having John Burns start on telmisartan and donepezil. I am also having him maintain his vitamin E, aspirin, calcium-vitamin D, nitroGLYCERIN, metoprolol tartrate, diltiazem, sildenafil, atorvastatin, and omeprazole.  Meds ordered this encounter  Medications  . telmisartan (MICARDIS) 20 MG tablet    Sig: Take 1 tablet (20 mg total) by mouth at bedtime.    Dispense:  30 tablet    Refill:  5  . donepezil (ARICEPT) 5 MG tablet    Sig: Take 1 tablet (5 mg total) by mouth at bedtime.    Dispense:  30 tablet    Refill:  5    A total of 25 minutes of face to face time was spent with patient more than half of which was  spent in counselling about the above mentioned conditions  and coordination of care    There are no discontinued medications.  Follow-up: No follow-ups on file.   Crecencio Mc, MD

## 2019-07-04 DIAGNOSIS — Z8673 Personal history of transient ischemic attack (TIA), and cerebral infarction without residual deficits: Secondary | ICD-10-CM | POA: Insufficient documentation

## 2019-07-04 LAB — T4, FREE: Free T4: 0.66 ng/dL (ref 0.60–1.60)

## 2019-07-04 LAB — TSH: TSH: 2.77 u[IU]/mL (ref 0.35–4.50)

## 2019-07-04 NOTE — Assessment & Plan Note (Signed)
Reviewed prior films done to rule out fractures. He has disk space narrowing,  Severe .at multiple levels with no signs of osteoporosis  Tylenol advised

## 2019-07-04 NOTE — Assessment & Plan Note (Signed)
Diagnosis of dementia supported by changes seen on brain MRI; both acquired and alzheimers suspected .  He has agreed to a trial of aricept

## 2019-07-04 NOTE — Assessment & Plan Note (Signed)
Noted on brain MRI.  Discussed BP control,  With goal 120/70

## 2019-07-04 NOTE — Assessment & Plan Note (Signed)
Adding telmisartan at night to metoprolol,  Return in one week for ck

## 2019-07-16 ENCOUNTER — Ambulatory Visit (INDEPENDENT_AMBULATORY_CARE_PROVIDER_SITE_OTHER): Payer: Medicare Other

## 2019-07-16 ENCOUNTER — Other Ambulatory Visit: Payer: Self-pay

## 2019-07-16 DIAGNOSIS — E538 Deficiency of other specified B group vitamins: Secondary | ICD-10-CM | POA: Diagnosis not present

## 2019-07-16 MED ORDER — CYANOCOBALAMIN 1000 MCG/ML IJ SOLN
1000.0000 ug | Freq: Once | INTRAMUSCULAR | Status: AC
  Administered 2019-07-16: 15:00:00 1000 ug via INTRAMUSCULAR

## 2019-07-16 NOTE — Progress Notes (Addendum)
Pt came in today for b12 injection. Given in R deltoid. Tolerated well, no complaints or concerns at this time.     I have reviewed the above information and agree with above.   Deborra Medina, MD

## 2019-07-20 ENCOUNTER — Ambulatory Visit: Payer: Medicare Other | Admitting: Internal Medicine

## 2019-07-20 ENCOUNTER — Ambulatory Visit: Payer: Medicare Other

## 2019-07-25 ENCOUNTER — Ambulatory Visit: Payer: Medicare Other

## 2019-07-31 ENCOUNTER — Telehealth: Payer: Self-pay

## 2019-07-31 ENCOUNTER — Telehealth: Payer: Self-pay | Admitting: *Deleted

## 2019-07-31 NOTE — Telephone Encounter (Signed)
Copied from [place] [id number]. Topic: General - Other >> Jul 31, 2019 12:28 PM Yvette Rack wrote: Reason for CRM: Pt wife requests that Janett Billow calls her back at [phone]

## 2019-07-31 NOTE — Telephone Encounter (Signed)
Copied from [place] [id number]. Topic: General - Other >> Jul 31, 2019 12:28 PM Yvette Rack wrote: Reason for CRM: Pt wife requests that Janett Billow calls her back at [phone]

## 2019-08-01 ENCOUNTER — Other Ambulatory Visit: Payer: Medicare Other

## 2019-08-01 NOTE — Telephone Encounter (Signed)
See previous message. Duplicate 

## 2019-08-01 NOTE — Telephone Encounter (Signed)
Spoke with pt to let him know that he did not need to come in for his lab appt today because he just had the same lab work done on 07/03/2019 and Dr. Lupita Dawn note stated that no further work up was needed. Pt gave a verbal understanding.

## 2019-08-07 DIAGNOSIS — R413 Other amnesia: Secondary | ICD-10-CM | POA: Diagnosis not present

## 2019-08-13 ENCOUNTER — Other Ambulatory Visit: Payer: Self-pay | Admitting: Internal Medicine

## 2019-08-13 MED ORDER — DILTIAZEM HCL ER COATED BEADS 120 MG PO CP24: 120.0000 mg | ORAL_CAPSULE | Freq: Every day | ORAL | 1 refills | Status: DC

## 2019-08-13 NOTE — Telephone Encounter (Signed)
Copied from [place] [id number]. Topic: Quick Communication - Rx Refill/Question >> Aug 13, 2019  9:40 AM Leward Quan A wrote: Medication: diltiazem (CARTIA XT) 120 MG 24 hr capsule  Has the patient contacted their pharmacy? Yes.   (Agent: If no, request that the patient contact the pharmacy for the refill.) (Agent: If yes, when and what did the pharmacy advise?)  Preferred Pharmacy (with phone number or street name):[place], [place] [phone] (Phone) [phone] (Fax)     Agent: Please be advised that RX refills may take up to 3 business days. We ask that you follow-up with your pharmacy.

## 2019-08-20 ENCOUNTER — Ambulatory Visit (INDEPENDENT_AMBULATORY_CARE_PROVIDER_SITE_OTHER): Payer: Medicare Other

## 2019-08-20 ENCOUNTER — Other Ambulatory Visit: Payer: Self-pay

## 2019-08-20 DIAGNOSIS — Z23 Encounter for immunization: Secondary | ICD-10-CM | POA: Diagnosis not present

## 2019-08-20 DIAGNOSIS — E538 Deficiency of other specified B group vitamins: Secondary | ICD-10-CM | POA: Diagnosis not present

## 2019-08-20 MED ORDER — CYANOCOBALAMIN 1000 MCG/ML IJ SOLN
1000.0000 ug | Freq: Once | INTRAMUSCULAR | Status: AC
  Administered 2019-08-20: 04:00:00 1000 ug via INTRAMUSCULAR

## 2019-08-20 NOTE — Progress Notes (Signed)
Patient presented today for B12 injection.  Administered IM in left deltoid.  Patient tolerated well with no signs of distress.   

## 2019-09-13 ENCOUNTER — Telehealth: Payer: Self-pay | Admitting: Internal Medicine

## 2019-09-13 NOTE — Telephone Encounter (Signed)
Pt wife called and stated that he is taking donepezil (ARICEPT) 5 MG tablet [id number] and telmisartan (MICARDIS) 20 MG tablet [id number]. Pt is still having a hard time sleeping and would like to know if he would take medication during the day. Pt wife would like a call back from the nurse.

## 2019-09-13 NOTE — Telephone Encounter (Signed)
Yes she can try having him take them in the afternoon

## 2019-09-17 NOTE — Telephone Encounter (Signed)
LMTCB

## 2019-09-18 NOTE — Telephone Encounter (Signed)
Spoke with pt's wife and informed her that Dr. Derrel Nip said that the pt could try taking the medications in the afternoon. Pt's wife gave a verbal understanding.

## 2019-09-19 ENCOUNTER — Other Ambulatory Visit: Payer: Self-pay

## 2019-09-20 ENCOUNTER — Ambulatory Visit (INDEPENDENT_AMBULATORY_CARE_PROVIDER_SITE_OTHER): Payer: Medicare Other

## 2019-09-20 ENCOUNTER — Other Ambulatory Visit: Payer: Self-pay

## 2019-09-20 DIAGNOSIS — E538 Deficiency of other specified B group vitamins: Secondary | ICD-10-CM

## 2019-09-20 MED ORDER — CYANOCOBALAMIN 1000 MCG/ML IJ SOLN
1000.0000 ug | Freq: Once | INTRAMUSCULAR | Status: AC
  Administered 2019-09-20: 1000 ug via INTRAMUSCULAR

## 2019-09-20 NOTE — Progress Notes (Addendum)
Patient presented today for B12 injection.  Administered IM in right deltoid.  Patient tolerated well with no signs of distress.  Reviewed.  Dr Scott    

## 2019-09-28 ENCOUNTER — Other Ambulatory Visit: Payer: Self-pay | Admitting: *Deleted

## 2019-09-28 MED ORDER — TELMISARTAN 20 MG PO TABS: 20.0000 mg | ORAL_TABLET | Freq: Every day | ORAL | 1 refills | Status: DC

## 2019-10-09 ENCOUNTER — Telehealth: Payer: Self-pay | Admitting: *Deleted

## 2019-10-09 MED ORDER — TELMISARTAN 20 MG PO TABS: 20.0000 mg | ORAL_TABLET | Freq: Every day | ORAL | 1 refills | Status: DC

## 2019-10-09 NOTE — Telephone Encounter (Signed)
Copied from [place] [phone]. Topic: General - Inquiry >> Oct 09, 2019 12:36 PM Percell Belt A wrote: Reason for CRM: pt wife called in and stated that they do not mail order anything. Can you resend the telmisartan (MICARDIS) 20 MG tablet [id number] to the [facility] in [place]

## 2019-10-09 NOTE — Telephone Encounter (Signed)
Medication has been recent.

## 2019-10-11 DIAGNOSIS — J449 Chronic obstructive pulmonary disease, unspecified: Secondary | ICD-10-CM | POA: Diagnosis not present

## 2019-10-11 DIAGNOSIS — I2510 Atherosclerotic heart disease of native coronary artery without angina pectoris: Secondary | ICD-10-CM | POA: Diagnosis not present

## 2019-10-11 DIAGNOSIS — Z951 Presence of aortocoronary bypass graft: Secondary | ICD-10-CM | POA: Diagnosis not present

## 2019-10-11 DIAGNOSIS — Z9889 Other specified postprocedural states: Secondary | ICD-10-CM | POA: Diagnosis not present

## 2019-10-11 DIAGNOSIS — R079 Chest pain, unspecified: Secondary | ICD-10-CM | POA: Diagnosis not present

## 2019-10-11 DIAGNOSIS — I471 Supraventricular tachycardia: Secondary | ICD-10-CM | POA: Diagnosis not present

## 2019-10-11 DIAGNOSIS — Z87891 Personal history of nicotine dependence: Secondary | ICD-10-CM | POA: Diagnosis not present

## 2019-10-11 DIAGNOSIS — R0602 Shortness of breath: Secondary | ICD-10-CM | POA: Diagnosis not present

## 2019-10-11 DIAGNOSIS — E785 Hyperlipidemia, unspecified: Secondary | ICD-10-CM | POA: Diagnosis not present

## 2019-10-11 DIAGNOSIS — I25708 Atherosclerosis of coronary artery bypass graft(s), unspecified, with other forms of angina pectoris: Secondary | ICD-10-CM | POA: Diagnosis not present

## 2019-10-16 DIAGNOSIS — Z961 Presence of intraocular lens: Secondary | ICD-10-CM | POA: Diagnosis not present

## 2019-10-23 ENCOUNTER — Ambulatory Visit: Payer: Medicare Other

## 2019-10-24 ENCOUNTER — Other Ambulatory Visit: Payer: Self-pay

## 2019-10-24 ENCOUNTER — Ambulatory Visit (INDEPENDENT_AMBULATORY_CARE_PROVIDER_SITE_OTHER): Payer: Medicare Other

## 2019-10-24 DIAGNOSIS — E538 Deficiency of other specified B group vitamins: Secondary | ICD-10-CM | POA: Diagnosis not present

## 2019-10-24 MED ORDER — CYANOCOBALAMIN 1000 MCG/ML IJ SOLN
1000.0000 ug | Freq: Once | INTRAMUSCULAR | Status: AC
  Administered 2019-10-24: 1000 ug via INTRAMUSCULAR

## 2019-10-24 NOTE — Progress Notes (Signed)
Patient presented today for B12 injections.  Administered IM in the left deltoid.  Patient tolerated well with no signs of distress.

## 2019-11-05 ENCOUNTER — Other Ambulatory Visit: Payer: Self-pay | Admitting: Internal Medicine

## 2019-11-05 NOTE — Telephone Encounter (Signed)
metoprolol tartrate (LOPRESSOR) 50 MG tablet  [place], [place] Phone:  [phone]  Fax:  [phone]     REFILL

## 2019-11-06 MED ORDER — METOPROLOL TARTRATE 50 MG PO TABS: 50.0000 mg | ORAL_TABLET | Freq: Every day | ORAL | 1 refills | Status: DC

## 2019-11-06 NOTE — Telephone Encounter (Signed)
Requested Prescriptions  Pending Prescriptions Disp Refills  . metoprolol tartrate (LOPRESSOR) 50 MG tablet 90 tablet 1    Sig: Take 1 tablet (50 mg total) by mouth daily.     There is no refill protocol information for this order

## 2019-11-12 ENCOUNTER — Ambulatory Visit (INDEPENDENT_AMBULATORY_CARE_PROVIDER_SITE_OTHER): Payer: Medicare Other

## 2019-11-12 ENCOUNTER — Other Ambulatory Visit: Payer: Self-pay

## 2019-11-12 VITALS — Ht 65.0 in | Wt 162.0 lb

## 2019-11-12 DIAGNOSIS — Z0000 Encounter for general adult medical examination without abnormal findings: Secondary | ICD-10-CM | POA: Diagnosis not present

## 2019-11-12 NOTE — Patient Instructions (Addendum)
  John Burns , Thank you for taking time to come for your Medicare Wellness Visit. I appreciate your ongoing commitment to your health goals. Please review the following plan we discussed and let me know if I can assist you in the future.   These are the goals we discussed: Goals      Patient Stated   . Follow up with Primary Care Provider (pt-stated)     Regarding colonoscopy       This is a list of the screening recommended for you and due dates:  Health Maintenance  Topic Date Due  . Colon Cancer Screening  02/20/2019  . Tetanus Vaccine  11/22/2019  . Flu Shot  Completed  . Pneumonia vaccines  Completed

## 2019-11-12 NOTE — Progress Notes (Signed)
Subjective:   John Burns is a 78 y.o. male who presents for Medicare Annual/Subsequent preventive examination.  Review of Systems:  No ROS.  Medicare Wellness Virtual Visit.  Visual/audio telehealth visit, UTA vital signs. Wt/Ht provided by patient.   See social history for additional risk factors.   Cardiac Risk Factors include: advanced age (>54men, >65 women);hypertension     Objective:    Vitals: Ht 5\' 5"  (1.651 m)   Wt 162 lb (73.5 kg)   BMI 26.96 kg/m   Body mass index is 26.96 kg/m.   Advanced Directives 11/12/2019 07/20/2018 07/19/2016 12/29/2015  Does Patient Have a Medical Advance Directive? No No Yes -  Type of Advance Directive - - [place] -  Does patient want to make changes to medical advance directive? No - Patient declined - - -  Copy of [place] in Chart? - - No - copy requested -  Would patient like information on creating a medical advance directive? - No - Patient declined - Yes - Scientist, clinical (histocompatibility and immunogenetics) given    Tobacco Social History   Tobacco Use  Smoking Status Former Smoker  . Packs/day: 1.00  . Years: 50.00  . Pack years: 50.00  . Types: Cigarettes  . Quit date: 11/22/1993  . Years since quitting: 25.9  Smokeless Tobacco Former Systems developer  . Types: Chew  Tobacco Comment   used chew to help quit smoking X1 year.     Counseling given: Not Answered Comment: used chew to help quit smoking X1 year.   Clinical Intake:  Pre-visit preparation completed: Yes        Diabetes: No  How often do you need to have someone help you when you read instructions, pamphlets, or other written materials from your doctor or pharmacy?: 1 - Never  Interpreter Needed?: No     Past Medical History:  Diagnosis Date  . ASCVD (arteriosclerotic cardiovascular disease)   . Bladder neck obstruction   . CAD (coronary artery disease) 2007   s/p PCI to mid LAD  . COPD (chronic obstructive pulmonary disease) ([place])   .  Diverticulosis   . GERD (gastroesophageal reflux disease)   . Heart attack ([place])   . History of hiatal hernia   . History of tobacco abuse   . HOH (hard of hearing)   . Hypercholesteremia   . Hypertension   . Shortness of breath dyspnea    on exertion  . Stenosis of coronary stent 2013   Past Surgical History:  Procedure Laterality Date  . ANGIOPLASTY / STENTING FEMORAL    . CARDIAC CATHETERIZATION  05/10/12   patent LAD stent w/ 50% in stent restenosis; 70% stenosis ostium of left circumflex; 75% stenosis mid left circumflex  . CATARACT EXTRACTION W/PHACO Left 12/29/2015   Procedure: CATARACT EXTRACTION PHACO AND INTRAOCULAR LENS PLACEMENT (IOC);  Surgeon: Estill Cotta, MD;  Location: [facility] ORS;  Service: Ophthalmology;  Laterality: Left;  [place]: 01:53.8 AP%: 26.2 CDE: 49.38 Lot # [id number]  . CATARACT EXTRACTION W/PHACO Right 01/26/2016   Procedure: CATARACT EXTRACTION PHACO AND INTRAOCULAR LENS PLACEMENT (IOC);  Surgeon: Estill Cotta, MD;  Location: [facility] ORS;  Service: Ophthalmology;  Laterality: Right;  [place] 01:13 AP% 25.3 CDE 33.95 fluid pack lot # [id number]  . COLONOSCOPY  02/05/14  . CORONARY ARTERY BYPASS GRAFT  06/04/14   x2 [place]  2007  . ESOPHAGOGASTRODUODENOSCOPY  02/05/14   Family History  Problem Relation Age of Onset  .  Heart disease Mother   . Cancer Father 72       prostate and colon cancer   Social History   Socioeconomic History  . Marital status: Married    Spouse name: Not on file  . Number of children: Not on file  . Years of education: Not on file  . Highest education level: Not on file  Occupational History  . Not on file  Tobacco Use  . Smoking status: Former Smoker    Packs/day: 1.00    Years: 50.00    Pack years: 50.00    Types: Cigarettes    Quit date: 11/22/1993    Years since quitting: 25.9  . Smokeless tobacco: Former Systems developer    Types: Chew  . Tobacco comment: used chew to help quit smoking X1 year.    Substance and Sexual Activity  . Alcohol use: No  . Drug use: No  . Sexual activity: Yes  Other Topics Concern  . Not on file  Social History Narrative  . Not on file   Social Determinants of Health   Financial Resource Strain:   . Difficulty of Paying Living Expenses: Not on file  Food Insecurity:   . Worried About Charity fundraiser in the Last Year: Not on file  . Ran Out of Food in the Last Year: Not on file  Transportation Needs: No Transportation Needs  . Lack of Transportation (Medical): No  . Lack of Transportation (Non-Medical): No  Physical Activity: Sufficiently Active  . Days of Exercise per Week: 5 days  . Minutes of Exercise per Session: 30 min  Stress:   . Feeling of Stress : Not on file  Social Connections:   . Frequency of Communication with Friends and Family: Not on file  . Frequency of Social Gatherings with Friends and Family: Not on file  . Attends Religious Services: Not on file  . Active Member of Clubs or Organizations: Not on file  . Attends Archivist Meetings: Not on file  . Marital Status: Not on file    Outpatient Encounter Medications as of 11/12/2019  Medication Sig  . aspirin 81 MG tablet Take 1 tablet (81 mg total) by mouth daily.  Marland Kitchen atorvastatin (LIPITOR) 20 MG tablet TAKE 1 TABLET BY MOUTH ONCE DAILY AT 6 PM  . Calcium Carb-Cholecalciferol (CALCIUM-VITAMIN D) 500-200 MG-UNIT tablet Take 1 tablet by mouth daily.  Marland Kitchen diltiazem (CARTIA XT) 120 MG 24 hr capsule Take 1 capsule (120 mg total) by mouth daily.  Marland Kitchen donepezil (ARICEPT) 5 MG tablet Take 1 tablet (5 mg total) by mouth at bedtime. (Patient taking differently: Take 5 mg by mouth at bedtime. Taking 10mg  one time daily)  . metoprolol tartrate (LOPRESSOR) 50 MG tablet Take 1 tablet (50 mg total) by mouth daily.  . nitroGLYCERIN (NITROSTAT) 0.4 MG SL tablet Place 1 tablet (0.4 mg total) under the tongue every 5 (five) minutes as needed for chest pain.  Marland Kitchen omeprazole (PRILOSEC) 20  MG capsule Take 1 capsule (20 mg total) by mouth daily.  . sildenafil (VIAGRA) 100 MG tablet TAKE 1 TABLET BY MOUTH ONCE DAILY AS NEEDED FOR ERECTILE DYSFUNCTION  . telmisartan (MICARDIS) 20 MG tablet Take 1 tablet (20 mg total) by mouth at bedtime.  . vitamin E 400 UNIT capsule Take 400 Units by mouth daily.   No facility-administered encounter medications on file as of 11/12/2019.    Activities of Daily Living In your present state of health, do you have any difficulty  performing the following activities: 11/12/2019  Hearing? N  Vision? N  Difficulty concentrating or making decisions? Y  Walking or climbing stairs? N  Dressing or bathing? N  Doing errands, shopping? N  Preparing Food and eating ? N  Using the Toilet? N  In the past six months, have you accidently leaked urine? N  Do you have problems with loss of bowel control? N  Managing your Medications? N  Managing your Finances? Y  Comment Wife assists  Housekeeping or managing your Housekeeping? N  Some recent data might be hidden    Patient Care Team: Crecencio Mc, MD as PCP - General (Internal Medicine)   Assessment:   This is a routine wellness examination for [facility].  Nurse connected with patient 11/12/19 at 10:00 AM EST by a telephone enabled telemedicine application and verified that I am speaking with the correct person using two identifiers. Patient stated full name and DOB. Patient gave permission to continue with virtual visit. Patient's location was at home and Nurse's location was at [place] office.   Patient is alert and oriented x3. Patient does have difficulty focusing or concentrating. Followed by Neurology, Dr. Melrose Nakayama.   Health Maintenance Due: -Cologuard/colonoscopy- discussed. See completed HM at the end of note.   Eye: Visual acuity not assessed. Virtual visit. Followed by their ophthalmologist.  Dental: Dentures- yes  Hearing: Demonstrates normal hearing during visit.  Safety:    Patient feels safe at home- yes Patient does have smoke detectors at home- yes Patient does wear sunscreen or protective clothing when in direct sunlight - yes Patient does wear seat belt when in a moving vehicle - yes Patient drives- yes Adequate lighting in walkways free from debris- yes Grab bars and handrails used as appropriate- yes Ambulates with an assistive device- no Cell phone on person when ambulating outside of the home- yes  Social: Alcohol intake - no Smoking history- former Smokers in home? none Illicit drug use? none  Medication: Taking aricept 10mg  per Dr. Melrose Nakayama. Taking at [place] to help prevent bad dreams.  Pill box in use -yes  Self managed - yes   Covid-19: Precautions and sickness symptoms discussed. Wears mask, social distancing, hand hygiene as appropriate.   Activities of Daily Living Patient denies needing assistance with: household chores, feeding themselves, getting from bed to chair, getting to the toilet, bathing/showering, dressing, or preparing meals.  Assisted by wife [name] with money management  Discussed the importance of a healthy diet, water intake and the benefits of aerobic exercise.   Physical activity- walks for exercises 2 miles daily.  Diet:  Regular Water: good intake  Other Providers Patient Care Team: Crecencio Mc, MD as PCP - General (Internal Medicine)  Exercise Activities and Dietary recommendations Current Exercise Habits: Home exercise routine, Type of exercise: walking, Intensity: Mild  Goals      Patient Stated   . Follow up with Primary Care Provider (pt-stated)     Regarding colonoscopy       Fall Risk Fall Risk  11/12/2019 07/20/2018 07/19/2017 11/17/2016 07/19/2016  Falls in the past year? 0 No No No No  Number falls in past yr: - - - - -  Injury with Fall? - - - - -  Comment - - - - -  Follow up Falls prevention discussed - - - -    Timed Get Up and Go Performed: no, virtual visit  Depression  Screen [employer] 2/9 Scores 11/12/2019 11/12/2019 07/20/2018 07/19/2017  PHQ - 2  Score 0 0 0 0    Cognitive Function MMSE - Mini Mental State Exam 07/20/2018 07/19/2016  Orientation to time 5 5  Orientation to Place 5 5  Registration 3 3  Attention/ Calculation 5 5  Recall 3 2  Language- name 2 objects 2 2  Language- repeat 1 1  Language- follow 3 step command 3 3  Language- read & follow direction 1 1  Write a sentence 1 1  Copy design 1 1  Total score 30 29     6CIT Screen 11/12/2019 07/19/2017  What Year? 0 points 0 points  What month? 0 points 0 points  What time? 0 points 0 points  Count back from 20 0 points 0 points  Months in reverse 2 points 0 points  Repeat phrase 4 points 0 points  Total Score 6 0    Immunization History  Administered Date(s) Administered  . Fluad Quad(high Dose 65+) 08/20/2019  . Influenza Split 09/26/2013  . Influenza, High Dose Seasonal PF 07/28/2016, 08/23/2017, 09/05/2018  . Influenza,inj,Quad PF,6+ Mos 08/28/2014, 09/30/2015  . Pneumococcal Conjugate-13 12/18/2014  . Pneumococcal Polysaccharide-23 11/26/2013  . Tdap 11/21/2009  . Zoster 11/21/2004   Screening Tests Health Maintenance  Topic Date Due  . COLONOSCOPY  02/20/2019  . TETANUS/TDAP  11/22/2019  . INFLUENZA VACCINE  Completed  . PNA vac Low Risk Adult  Completed       Plan:   Keep all routine maintenance appointments.  Next scheduled lab 11/20/19 Follow up 11/21/19. Discuss if colonoscopy/cologuard is needed and aricept 10mg ; sleep disturbance.   Medicare Attestation I have personally reviewed: The patient's medical and social history Their use of alcohol, tobacco or illicit drugs Their current medications and supplements The patient's functional ability including ADLs,fall risks, home safety risks, cognitive, and hearing and visual impairment Diet and physical activities Evidence for depression   I have reviewed and discussed with patient certain preventive protocols,  quality metrics, and best practice recommendations.   Varney Biles, LPN  [id number]

## 2019-11-20 ENCOUNTER — Other Ambulatory Visit: Payer: Self-pay

## 2019-11-20 ENCOUNTER — Other Ambulatory Visit (INDEPENDENT_AMBULATORY_CARE_PROVIDER_SITE_OTHER): Payer: Medicare Other

## 2019-11-20 DIAGNOSIS — E039 Hypothyroidism, unspecified: Secondary | ICD-10-CM | POA: Diagnosis not present

## 2019-11-20 LAB — T4, FREE: Free T4: 0.75 ng/dL (ref 0.60–1.60)

## 2019-11-20 LAB — TSH: TSH: 5.41 u[IU]/mL — ABNORMAL HIGH (ref 0.35–4.50)

## 2019-11-21 ENCOUNTER — Encounter: Payer: Self-pay | Admitting: Internal Medicine

## 2019-11-21 ENCOUNTER — Ambulatory Visit (INDEPENDENT_AMBULATORY_CARE_PROVIDER_SITE_OTHER): Payer: Medicare Other | Admitting: Internal Medicine

## 2019-11-21 VITALS — BP 151/72 | Ht 65.0 in | Wt 162.0 lb

## 2019-11-21 DIAGNOSIS — R7989 Other specified abnormal findings of blood chemistry: Secondary | ICD-10-CM

## 2019-11-21 DIAGNOSIS — I10 Essential (primary) hypertension: Secondary | ICD-10-CM | POA: Diagnosis not present

## 2019-11-21 DIAGNOSIS — F0150 Vascular dementia without behavioral disturbance: Secondary | ICD-10-CM | POA: Diagnosis not present

## 2019-11-21 DIAGNOSIS — E538 Deficiency of other specified B group vitamins: Secondary | ICD-10-CM | POA: Diagnosis not present

## 2019-11-21 DIAGNOSIS — R5383 Other fatigue: Secondary | ICD-10-CM

## 2019-11-21 DIAGNOSIS — F339 Major depressive disorder, recurrent, unspecified: Secondary | ICD-10-CM | POA: Insufficient documentation

## 2019-11-21 MED ORDER — MIRTAZAPINE 15 MG PO TABS: ORAL_TABLET | ORAL | 2 refills | Status: DC

## 2019-11-21 MED ORDER — DONEPEZIL HCL 10 MG PO TABS: 10.0000 mg | ORAL_TABLET | Freq: Every day | ORAL | 5 refills | Status: DC

## 2019-11-21 NOTE — Progress Notes (Signed)
Telephone  Note  This visit type was conducted due to national recommendations for restrictions regarding the COVID-19 pandemic (e.g. social distancing).  This format is felt to be most appropriate for this patient at this time.  All issues noted in this document were discussed and addressed.  No physical exam was performed (except for noted visual exam findings with Video Visits).   I connected with@ on 11/21/19 at  9:00 AM EST by  telephone and verified that I am speaking with the correct person using two identifiers. Location patient: home Location provider: work or home office Persons participating in the virtual visit: patient, provider  I discussed the limitations, risks, security and privacy concerns of performing an evaluation and management service by telephone and the availability of in person appointments. I also discussed with the patient that there may be a patient responsible charge related to this service. The patient expressed understanding and agreed to proceed.   Reason for visit: follow up   HPI:   78 yr old male with history of short term memory loss,   Found to have b12 deficiency,  Elevated tsh.  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  Tolerating the dose.  But wife states he now has bad dreams  And not sleeping well.  Tosses and turns    Wife says his memory has improved slightly but not consistently,  Does not repeat questions any more    Weight stable. Sedentary during winter months. Pants getting tighter.   ROS: See pertinent positives and negatives per HPI.  Past Medical History:  Diagnosis Date  . ASCVD (arteriosclerotic cardiovascular disease)   . Bladder neck obstruction   . CAD (coronary artery disease) 2007   s/p PCI to mid LAD  . COPD (chronic obstructive pulmonary disease) ([place])   . Diverticulosis   . GERD (gastroesophageal reflux disease)   .  Heart attack ([place])   . History of hiatal hernia   . History of tobacco abuse   . HOH (hard of hearing)   . Hypercholesteremia   . Hypertension   . Shortness of breath dyspnea    on exertion  . Stenosis of coronary stent 2013    Past Surgical History:  Procedure Laterality Date  . ANGIOPLASTY / STENTING FEMORAL    . CARDIAC CATHETERIZATION  05/10/12   patent LAD stent w/ 50% in stent restenosis; 70% stenosis ostium of left circumflex; 75% stenosis mid left circumflex  . CATARACT EXTRACTION W/PHACO Left 12/29/2015   Procedure: CATARACT EXTRACTION PHACO AND INTRAOCULAR LENS PLACEMENT (IOC);  Surgeon: Estill Cotta, MD;  Location: [facility] ORS;  Service: Ophthalmology;  Laterality: Left;  [place]: 01:53.8 AP%: 26.2 CDE: 49.38 Lot # [id number]  . CATARACT EXTRACTION W/PHACO Right 01/26/2016   Procedure: CATARACT EXTRACTION PHACO AND INTRAOCULAR LENS PLACEMENT (IOC);  Surgeon: Estill Cotta, MD;  Location: [facility] ORS;  Service: Ophthalmology;  Laterality: Right;  [place] 01:13 AP% 25.3 CDE 33.95 fluid pack lot # [id number]  . COLONOSCOPY  02/05/14  . CORONARY ARTERY BYPASS GRAFT  06/04/14   x2 [place]  2007  . ESOPHAGOGASTRODUODENOSCOPY  02/05/14    Family History  Problem Relation Age of Onset  . Heart disease Mother   . Cancer Father 44       prostate and colon cancer    SOCIAL HX:  reports that he quit smoking  about 26 years ago. His smoking use included cigarettes. He has a 50.00 pack-year smoking history. He has quit using smokeless tobacco.  His smokeless tobacco use included chew. He reports that he does not drink alcohol or use drugs.   Current Outpatient Medications:  .  aspirin 81 MG tablet, Take 1 tablet (81 mg total) by mouth daily., Disp: 90 tablet, Rfl: 1 .  atorvastatin (LIPITOR) 20 MG tablet, TAKE 1 TABLET BY MOUTH ONCE DAILY AT 6 PM, Disp: 90 tablet, Rfl: 1 .  Calcium Carb-Cholecalciferol (CALCIUM-VITAMIN D) 500-200 MG-UNIT tablet, Take 1 tablet  by mouth daily., Disp: , Rfl:  .  diltiazem (CARTIA XT) 120 MG 24 hr capsule, Take 1 capsule (120 mg total) by mouth daily., Disp: 90 capsule, Rfl: 1 .  donepezil (ARICEPT) 10 MG tablet, Take 1 tablet (10 mg total) by mouth at bedtime. Taking 10mg  one time daily, Disp: 30 tablet, Rfl: 5 .  metoprolol tartrate (LOPRESSOR) 50 MG tablet, Take 1 tablet (50 mg total) by mouth daily., Disp: 90 tablet, Rfl: 1 .  nitroGLYCERIN (NITROSTAT) 0.4 MG SL tablet, Place 1 tablet (0.4 mg total) under the tongue every 5 (five) minutes as needed for chest pain., Disp: 50 tablet, Rfl: 3 .  omeprazole (PRILOSEC) 20 MG capsule, Take 1 capsule (20 mg total) by mouth daily., Disp: 90 capsule, Rfl: 1 .  sildenafil (VIAGRA) 100 MG tablet, TAKE 1 TABLET BY MOUTH ONCE DAILY AS NEEDED FOR ERECTILE DYSFUNCTION, Disp: , Rfl:  .  telmisartan (MICARDIS) 20 MG tablet, Take 1 tablet (20 mg total) by mouth at bedtime., Disp: 90 tablet, Rfl: 1 .  vitamin E 400 UNIT capsule, Take 400 Units by mouth daily., Disp: , Rfl:  .  mirtazapine (REMERON) 15 MG tablet, 1/2 to 1 tablet 30 minutes before bedtime daily, Disp: 30 tablet, Rfl: 2  EXAM:  VITALS per patient if applicable:  GENERAL: alert, oriented, appears well and in no acute distress  HEENT: atraumatic, conjunttiva clear, no obvious abnormalities on inspection of external nose and ears  NECK: normal movements of the head and neck  LUNGS: on inspection no signs of respiratory distress, breathing rate appears normal, no obvious gross SOB, gasping or wheezing  CV: no obvious cyanosis  MS: moves all visible extremities without noticeable abnormality  PSYCH/NEURO: pleasant and cooperative, no obvious depression or anxiety, speech and thought processing grossly intact  ASSESSMENT AND PLAN:  Discussed the following assessment and plan:  Essential hypertension  B12 deficiency  Vascular dementia without behavioral disturbance (HCC)  Major depressive disorder, recurrent  episode with melancholic features ([place])  Essential hypertension Added  telmisartan at night to metoprolol,  bp at parasho's office was normal.  Needs rn visit to rechec  B12 deficiency Secondary to prolonged ppi use which is necessary  due to history of Barrett's esophagus.  Intrinsic factor ab test is negative,  Her prefers to continue monthly injections but missed his last one and feels more tired than usual.  Repeat level has dropped slightly,  Ut not below 300. Resume monthly injections  Lab Results  Component Value Date   VITAMINB12 480 05/29/2019     Vascular dementia ([place]) Confirmed with SLUMS sore 14/20 by Melrose Nakayama,  Continue aricept higher dose  10 mg daily adding Remeron for insomnia and depressive symptom   Major depressive disorder, recurrent episode with melancholic features ([place]) Adding remeron.  rtc one month     I discussed the assessment and treatment plan with the patient. The patient was  provided an opportunity to ask questions and all were answered. The patient agreed with the plan and demonstrated an understanding of the instructions.   The patient was advised to call back or seek an in-person evaluation if the symptoms worsen or if the condition fails to improve as anticipated.  I provided 22 minutes of non-face-to-face time during this encounter.   Crecencio Mc, MD

## 2019-11-21 NOTE — Assessment & Plan Note (Signed)
Adding remeron.  rtc one month

## 2019-11-21 NOTE — Assessment & Plan Note (Signed)
Added  telmisartan at night to metoprolol,  bp at parasho's office was normal.  Needs rn visit to rechec

## 2019-11-21 NOTE — Patient Instructions (Addendum)
Starting  You on mirtazipine for your depression and insomnia  Take 1/2 tablet daily AT BEDTIME  After one week increase your dose TO ONE WHOLE TABLET  YOUR NEXT B12 SHOT IS DUE NEXT WEEK,  THE OFFICE WILL CALL YOU  WE WILL REPEAT YOUR THYROID TEST IN 4 WEEKS WHEN YOU RETURN TO SEE ME

## 2019-11-21 NOTE — Assessment & Plan Note (Signed)
Secondary to prolonged ppi use which is necessary  due to history of Barrett's esophagus.  Intrinsic factor ab test is negative,  Her prefers to continue monthly injections but missed his last one and feels more tired than usual.  Repeat level has dropped slightly,  Ut not below 300. Resume monthly injections  Lab Results  Component Value Date   VITAMINB12 480 05/29/2019

## 2019-11-21 NOTE — Assessment & Plan Note (Signed)
Confirmed with SLUMS sore 14/20 by Melrose Nakayama,  Continue aricept higher dose  10 mg daily adding Remeron for insomnia and depressive symptom

## 2019-11-27 ENCOUNTER — Ambulatory Visit (INDEPENDENT_AMBULATORY_CARE_PROVIDER_SITE_OTHER): Payer: Medicare Other | Admitting: Lab

## 2019-11-27 ENCOUNTER — Other Ambulatory Visit: Payer: Self-pay

## 2019-11-27 ENCOUNTER — Other Ambulatory Visit (INDEPENDENT_AMBULATORY_CARE_PROVIDER_SITE_OTHER): Payer: Medicare Other

## 2019-11-27 DIAGNOSIS — R5383 Other fatigue: Secondary | ICD-10-CM

## 2019-11-27 DIAGNOSIS — E538 Deficiency of other specified B group vitamins: Secondary | ICD-10-CM | POA: Diagnosis not present

## 2019-11-27 DIAGNOSIS — E039 Hypothyroidism, unspecified: Secondary | ICD-10-CM

## 2019-11-27 DIAGNOSIS — I10 Essential (primary) hypertension: Secondary | ICD-10-CM | POA: Diagnosis not present

## 2019-11-27 LAB — COMPREHENSIVE METABOLIC PANEL
ALT: 21 U/L (ref 0–53)
AST: 23 U/L (ref 0–37)
Albumin: 4.1 g/dL (ref 3.5–5.2)
Alkaline Phosphatase: 113 U/L (ref 39–117)
BUN: 13 mg/dL (ref 6–23)
CO2: 23 mEq/L (ref 19–32)
Calcium: 9.5 mg/dL (ref 8.4–10.5)
Chloride: 107 mEq/L (ref 96–112)
Creatinine, Ser: 0.96 mg/dL (ref 0.40–1.50)
GFR: 75.55 mL/min (ref >60.00)
Glucose, Bld: 85 mg/dL (ref 70–99)
Potassium: 4.1 mEq/L (ref 3.5–5.1)
Sodium: 138 mEq/L (ref 135–145)
Total Bilirubin: 0.6 mg/dL (ref 0.2–1.2)
Total Protein: 6.6 g/dL (ref 6.0–8.3)

## 2019-11-27 LAB — TSH: TSH: 4.51 u[IU]/mL — ABNORMAL HIGH (ref 0.35–4.50)

## 2019-11-27 MED ORDER — CYANOCOBALAMIN 1000 MCG/ML IJ SOLN
1000.0000 ug | Freq: Once | INTRAMUSCULAR | Status: AC
  Administered 2019-11-27: 12:00:00 1000 ug via INTRAMUSCULAR

## 2019-11-27 NOTE — Progress Notes (Signed)
Reviewed.  Blood pressure reviewed.  Appears on micardis - added relatively recently (?).  Per note, blood pressure ok in cardiology office.  Can spot check pressure.  If remains elevated, my need to increase micardis.  Forward to Dr Derrel Nip for review.    Dr Nicki Reaper

## 2019-11-27 NOTE — Progress Notes (Addendum)
Pt in office today to have B-12 injection in R-Deltoid. Pt tolerated well. Pt also had his vitals done. BP- 152/78 O2- 97% Temp-97.6 HR-56   BP is elevated but was normal recently in cardiology office.  Please ask patient to recheck at home 3 times over the next week and send readings to office

## 2019-11-30 ENCOUNTER — Telehealth: Payer: Self-pay

## 2019-11-30 DIAGNOSIS — E039 Hypothyroidism, unspecified: Secondary | ICD-10-CM | POA: Insufficient documentation

## 2019-11-30 MED ORDER — LEVOTHYROXINE SODIUM 50 MCG PO TABS: 50.0000 ug | ORAL_TABLET | Freq: Every day | ORAL | 0 refills | Status: DC

## 2019-11-30 MED ORDER — OMEPRAZOLE 20 MG PO CPDR: 20.0000 mg | DELAYED_RELEASE_CAPSULE | Freq: Every day | ORAL | 1 refills | Status: DC

## 2019-11-30 NOTE — Addendum Note (Signed)
Addended by: Crecencio Mc on: 11/30/2019 01:06 PM   Modules accepted: Orders

## 2019-11-30 NOTE — Addendum Note (Signed)
Addended by: Crecencio Mc on: 11/30/2019 01:05 PM   Modules accepted: Orders

## 2019-11-30 NOTE — Telephone Encounter (Signed)
Pt's wife called to see if pt's lab results had came back yet. I see they have came back just have not been reviewed yet.

## 2019-11-30 NOTE — Progress Notes (Signed)
tsh

## 2019-11-30 NOTE — Assessment & Plan Note (Signed)
REPEAT tsh STILL ABNORMAL.  STARTING LEVOTHYROXINE

## 2019-12-07 ENCOUNTER — Telehealth: Payer: Self-pay | Admitting: Internal Medicine

## 2019-12-07 NOTE — Telephone Encounter (Signed)
See result note message 

## 2019-12-07 NOTE — Telephone Encounter (Signed)
Pt's wife called and would like husband's lab result. Please call her [phone].

## 2019-12-07 NOTE — Telephone Encounter (Signed)
They can be taken together.

## 2019-12-10 ENCOUNTER — Other Ambulatory Visit: Payer: Self-pay | Admitting: Internal Medicine

## 2019-12-10 NOTE — Telephone Encounter (Signed)
Spoke with pt's wife to let her know that the pt can take both the omeprazole and levothyroxine together. Pt's wife gave a verbal understanding.

## 2019-12-10 NOTE — Telephone Encounter (Signed)
LMTCB. Need to let pt or pt's wife know that he can take both the omeprazole and the levothyroxine at the same time.

## 2019-12-21 ENCOUNTER — Other Ambulatory Visit: Payer: Self-pay

## 2019-12-24 ENCOUNTER — Ambulatory Visit (INDEPENDENT_AMBULATORY_CARE_PROVIDER_SITE_OTHER): Payer: Medicare Other | Admitting: Internal Medicine

## 2019-12-24 ENCOUNTER — Encounter: Payer: Self-pay | Admitting: Internal Medicine

## 2019-12-24 ENCOUNTER — Other Ambulatory Visit: Payer: Self-pay

## 2019-12-24 DIAGNOSIS — E039 Hypothyroidism, unspecified: Secondary | ICD-10-CM

## 2019-12-24 DIAGNOSIS — I10 Essential (primary) hypertension: Secondary | ICD-10-CM

## 2019-12-24 DIAGNOSIS — F0150 Vascular dementia without behavioral disturbance: Secondary | ICD-10-CM

## 2019-12-24 DIAGNOSIS — F339 Major depressive disorder, recurrent, unspecified: Secondary | ICD-10-CM

## 2019-12-24 NOTE — Progress Notes (Signed)
Subjective:  Patient ID: John Burns, male    DOB: Apr 10, 1941  Age: 79 y.o. MRN: [id number]  CC: Diagnoses of Vascular dementia without behavioral disturbance ([place]), Major depressive disorder, recurrent episode with melancholic features ([place]), Essential hypertension, and Hypothyroidism (acquired) were pertinent to this visit.  HPI John Burns presents for follow up on multiple issues including,  CAD,  Dementia, depression with insomnia and nightmares.  This visit occurred during the SARS-CoV-2 public health emergency.  Safety protocols were in place, including screening questions prior to the visit, additional usage of staff PPE, and extensive cleaning of exam room while observing appropriate contact time as indicated for disinfecting solutions.    Insomnia with nightmares:  At last visit he reported these symptoms had started after his  dose of Aricept was increased b y Neurology.  I recommend a trial of  Remeron one month ago for treatment of insomnia and multiple signs of mild depression. He states today that he never picked it up, but that his nightmares are less frequently occurring and he is sleeping better.     Depression:  His wife states that he is frequently irritable towards her and is reluctant to elaborate . Patient agrees that he should be "nicer" to her .  She agrees that  He appears to be sleeping better but still  having nightmares.  No sleepwalking or thrashing around. Talks in his sleep occasionally.  He has changed his dosing of  aricept to late afternoon instead of in the evening.  Hypothyroidism:  He states that he did  start the levothyroxine  Wife says he is taking it in the morning on empty stomach,  45  Minutes prior to breakfast .  He has Resumed monthly B12 injections    Outpatient Medications Prior to Visit  Medication Sig Dispense Refill  . aspirin 81 MG tablet Take 1 tablet (81 mg total) by mouth daily. 90 tablet 1  . atorvastatin (LIPITOR) 20 MG  tablet TAKE 1 TABLET BY MOUTH ONCE DAILY AT 6PM. 90 tablet 0  . Calcium Carb-Cholecalciferol (CALCIUM-VITAMIN D) 500-200 MG-UNIT tablet Take 1 tablet by mouth daily.    Marland Kitchen diltiazem (CARTIA XT) 120 MG 24 hr capsule Take 1 capsule (120 mg total) by mouth daily. 90 capsule 1  . donepezil (ARICEPT) 10 MG tablet Take 1 tablet (10 mg total) by mouth at bedtime. Taking 10mg  one time daily 30 tablet 5  . levothyroxine (SYNTHROID) 50 MCG tablet Take 1 tablet (50 mcg total) by mouth daily. 90 tablet 0  . metoprolol tartrate (LOPRESSOR) 50 MG tablet Take 1 tablet (50 mg total) by mouth daily. 90 tablet 1  . nitroGLYCERIN (NITROSTAT) 0.4 MG SL tablet Place 1 tablet (0.4 mg total) under the tongue every 5 (five) minutes as needed for chest pain. 50 tablet 3  . omeprazole (PRILOSEC) 20 MG capsule Take 1 capsule (20 mg total) by mouth daily. 90 capsule 1  . sildenafil (VIAGRA) 100 MG tablet TAKE 1 TABLET BY MOUTH ONCE DAILY AS NEEDED FOR ERECTILE DYSFUNCTION    . telmisartan (MICARDIS) 20 MG tablet Take 1 tablet (20 mg total) by mouth at bedtime. 90 tablet 1  . vitamin E 400 UNIT capsule Take 400 Units by mouth daily.    . mirtazapine (REMERON) 15 MG tablet 1/2 to 1 tablet 30 minutes before bedtime daily 30 tablet 2   No facility-administered medications prior to visit.    Review of Systems;  Patient denies headache, fevers, malaise, unintentional  weight loss, skin rash, eye pain, sinus congestion and sinus pain, sore throat, dysphagia,  hemoptysis , cough, dyspnea, wheezing, chest pain, palpitations, orthopnea, edema, abdominal pain, nausea, melena, diarrhea, constipation, flank pain, dysuria, hematuria, urinary  Frequency, nocturia, numbness, tingling, seizures,  Focal weakness, Loss of consciousness,  Tremor,  and suicidal ideation.      Objective:  BP 130/60 (BP Location: Left Arm, Patient Position: Sitting, Cuff Size: Normal)   Pulse 62   Temp 97.7 F (36.5 C) (Temporal)   Resp 14   Ht 5\' 5"  (1.651  m)   Wt 165 lb 6.4 oz (75 kg)   SpO2 97%   BMI 27.52 kg/m   BP Readings from Last 3 Encounters:  12/24/19 130/60  11/21/19 (!) 151/72  07/03/19 136/68    Wt Readings from Last 3 Encounters:  12/24/19 165 lb 6.4 oz (75 kg)  11/21/19 162 lb (73.5 kg)  11/12/19 162 lb (73.5 kg)    General appearance: alert, cooperative and appears stated age Ears: normal TM's and external ear canals both ears Throat: lips, mucosa, and tongue normal; teeth and gums normal Neck: no adenopathy, no carotid bruit, supple, symmetrical, trachea midline and thyroid not enlarged, symmetric, no tenderness/mass/nodules Back: symmetric, no curvature. ROM normal. No CVA tenderness. Lungs: clear to auscultation bilaterally Heart: regular rate and rhythm, S1, S2 normal, no murmur, click, rub or gallop Abdomen: soft, non-tender; bowel sounds normal; no masses,  no organomegaly Pulses: 2+ and symmetric Skin: Skin color, texture, turgor normal. No rashes or lesions Lymph nodes: Cervical, supraclavicular, and axillary nodes normal. Psych: affect normal, makes good eye contact. No fidgeting,  Smiles easily.  Denies suicidal thoughts   No results found for: HGBA1C  Lab Results  Component Value Date   CREATININE 0.96 11/27/2019   CREATININE 1.13 05/29/2019   CREATININE 1.11 11/17/2018    Lab Results  Component Value Date   WBC 5.5 05/29/2019   HGB 14.5 05/29/2019   HCT 43.5 05/29/2019   PLT 140.0 (L) 05/29/2019   GLUCOSE 85 11/27/2019   CHOL 126 05/29/2019   TRIG 56.0 05/29/2019   HDL 47.90 05/29/2019   LDLCALC 67 05/29/2019   ALT 21 11/27/2019   AST 23 11/27/2019   NA 138 11/27/2019   K 4.1 11/27/2019   CL 107 11/27/2019   CREATININE 0.96 11/27/2019   BUN 13 11/27/2019   CO2 23 11/27/2019   TSH 4.51 (H) 11/27/2019   PSA 2.33 05/22/2015    MR Brain Wo Contrast  Result Date: 06/08/2019 CLINICAL DATA:  Memory loss for 1 year EXAM: MRI HEAD WITHOUT CONTRAST TECHNIQUE: Multiplanar, multiecho pulse  sequences of the brain and surrounding structures were obtained without intravenous contrast. COMPARISON:  None. FINDINGS: Brain: No recent infarction, hemorrhage, hydrocephalus, extra-axial collection or mass lesion. Age normal brain volume Moderate FLAIR hyperintensity in the cerebral white matter with Lacunes seen asymmetrically on the right. At least 4 remote micro hemorrhages superficially in the bilateral cerebrum. Small remote posterior right frontal cortex infarct. Vascular: Major flow voids are preserved Skull and upper cervical spine: Curved negative for marrow lesion Sinuses/Orbits: Bilateral cataract resection IMPRESSION: 1. No specific or reversible finding to explain memory loss. 2. Moderate white matter disease including remote lacunar infarcts. 3. Few remote lobar micro hemorrhages, early amyloid angiopathy is a consideration. Electronically Signed   By: Monte Fantasia M.D.   On: 06/08/2019 10:24    Assessment & Plan:   Problem List Items Addressed This Visit      Unprioritized  Essential hypertension    Repeat reading in office by me matches home readings .  No changes today       Hypothyroidism (acquired)    Repeat assessment due in late February after 6 weeks of replacement therapy for persistnet elevation in the setting of dementia fatigue and weight gain.   Lab Results  Component Value Date   TSH 4.51 (H) 11/27/2019        Major depressive disorder, recurrent episode with melancholic features ([place])    Given his weight gain and improved insomnia without remeron,  Will dc med (never started) and use sertraline starting at 25 mg dose      Relevant Medications   sertraline (ZOLOFT) 50 MG tablet   Vascular dementia ([place])    Diagnosed by Melrose Nakayama.  Tolerating Aricept. Wife states that he is repeating questions less frequently than before .Concurrent depression addressed today,  With plans to start SSRI       Relevant Medications   sertraline (ZOLOFT) 50 MG tablet       I have discontinued Dylyn L. Micek's mirtazapine. I am also having him start on sertraline. Additionally, I am having him maintain his vitamin E, aspirin, calcium-vitamin D, nitroGLYCERIN, sildenafil, diltiazem, telmisartan, metoprolol tartrate, donepezil, omeprazole, levothyroxine, and atorvastatin.  Meds ordered this encounter  Medications  . sertraline (ZOLOFT) 50 MG tablet    Sig: Take 1 tablet (50 mg total) by mouth daily.    Dispense:  30 tablet    Refill:  3    Medications Discontinued During This Encounter  Medication Reason  . mirtazapine (REMERON) 15 MG tablet     A total of 30 minutes of face to face time was spent with patient more than half of which was spent in counselling about the above mentioned conditions  and coordination of care   Follow-up: No follow-ups on file.   Crecencio Mc, MD

## 2019-12-24 NOTE — Patient Instructions (Signed)
WE Will recheck your thyroid level during the 3rd week of February  Continue 50 mg levothyroxine in the morning  Your blood pressure is FINE!   CONTINUE your current medications   I would like to start you on a medication to improve your irritable mood Once your medication insurance is loaded into the system,  I will choose one and let you know how to take it   I recommend that you take tylenol or motrin before and after your next [place]

## 2019-12-25 ENCOUNTER — Telehealth: Payer: Self-pay | Admitting: Internal Medicine

## 2019-12-25 ENCOUNTER — Other Ambulatory Visit: Payer: Self-pay

## 2019-12-25 MED ORDER — SERTRALINE HCL 50 MG PO TABS: 50.0000 mg | ORAL_TABLET | Freq: Every day | ORAL | 3 refills | Status: DC

## 2019-12-25 NOTE — Telephone Encounter (Signed)
Spoke with pt's wife and informed her of the medication that Dr. Derrel Nip has sent in for the pt and how to take the medication. Pt's wife wrote everything down and gave a verbal understanding.

## 2019-12-25 NOTE — Assessment & Plan Note (Addendum)
Diagnosed by Melrose Nakayama.  Tolerating Aricept. Wife states that he is repeating questions less frequently than before .Concurrent depression addressed today,  With plans to start SSRI

## 2019-12-25 NOTE — Assessment & Plan Note (Signed)
Repeat assessment due in late February after 6 weeks of replacement therapy for persistnet elevation in the setting of dementia fatigue and weight gain.   Lab Results  Component Value Date   TSH 4.51 (H) 11/27/2019

## 2019-12-25 NOTE — Telephone Encounter (Signed)
Please notify patient and wife  That I have sent rx for sertraline (generic for Zoloft) for his anxiety/depression to his regular pharmacy.  Please start the medication at 1/2 tablet daily  After supper in the evening for the first few days to avoid nausea.  You can increase to a full tablet after 7 days if you havenot developed side effects of nausea.  If the medication interferes with your sleep, take it in the morning instead  Please schedule a return visit in  4 weeks ,

## 2019-12-25 NOTE — Assessment & Plan Note (Addendum)
Given his weight gain and improved insomnia without remeron,  Will dc med (never started) and use sertraline starting at 25 mg dose

## 2019-12-25 NOTE — Assessment & Plan Note (Signed)
Repeat reading in office by me matches home readings .  No changes today

## 2020-01-01 ENCOUNTER — Other Ambulatory Visit: Payer: Self-pay

## 2020-01-01 ENCOUNTER — Ambulatory Visit (INDEPENDENT_AMBULATORY_CARE_PROVIDER_SITE_OTHER): Payer: Medicare Other | Admitting: Lab

## 2020-01-01 VITALS — Temp 96.9°F

## 2020-01-01 DIAGNOSIS — E538 Deficiency of other specified B group vitamins: Secondary | ICD-10-CM | POA: Diagnosis not present

## 2020-01-01 MED ORDER — CYANOCOBALAMIN 1000 MCG/ML IJ SOLN
1000.0000 ug | Freq: Once | INTRAMUSCULAR | Status: AC
  Administered 2020-01-01: 1000 ug via INTRAMUSCULAR

## 2020-01-01 NOTE — Progress Notes (Addendum)
Pt in office today for Vit B-12 injection in R-arm. Pt tolerated well. (Repeat injection in same arm, Pt saving Left arm for 2nd Covid shot next week).    Reviewed.  Dr Nicki Reaper

## 2020-01-06 ENCOUNTER — Emergency Department
Admission: EM | Admit: 2020-01-06 | Discharge: 2020-01-06 | Disposition: A | Discharge status: Home / Self Care | Payer: Medicare Other | Attending: Emergency Medicine | Admitting: Emergency Medicine

## 2020-01-06 ENCOUNTER — Encounter: Payer: Self-pay | Admitting: Emergency Medicine

## 2020-01-06 ENCOUNTER — Emergency Department: Payer: Medicare Other

## 2020-01-06 ENCOUNTER — Other Ambulatory Visit: Payer: Self-pay

## 2020-01-06 DIAGNOSIS — Z951 Presence of aortocoronary bypass graft: Secondary | ICD-10-CM | POA: Insufficient documentation

## 2020-01-06 DIAGNOSIS — R1012 Left upper quadrant pain: Secondary | ICD-10-CM | POA: Insufficient documentation

## 2020-01-06 DIAGNOSIS — Z79899 Other long term (current) drug therapy: Secondary | ICD-10-CM | POA: Insufficient documentation

## 2020-01-06 DIAGNOSIS — M545 Low back pain: Secondary | ICD-10-CM | POA: Insufficient documentation

## 2020-01-06 DIAGNOSIS — I10 Essential (primary) hypertension: Secondary | ICD-10-CM | POA: Insufficient documentation

## 2020-01-06 DIAGNOSIS — J449 Chronic obstructive pulmonary disease, unspecified: Secondary | ICD-10-CM | POA: Diagnosis not present

## 2020-01-06 DIAGNOSIS — Z87891 Personal history of nicotine dependence: Secondary | ICD-10-CM | POA: Diagnosis not present

## 2020-01-06 DIAGNOSIS — M48061 Spinal stenosis, lumbar region without neurogenic claudication: Secondary | ICD-10-CM | POA: Diagnosis not present

## 2020-01-06 DIAGNOSIS — M549 Dorsalgia, unspecified: Secondary | ICD-10-CM

## 2020-01-06 DIAGNOSIS — I2510 Atherosclerotic heart disease of native coronary artery without angina pectoris: Secondary | ICD-10-CM | POA: Insufficient documentation

## 2020-01-06 DIAGNOSIS — K5730 Diverticulosis of large intestine without perforation or abscess without bleeding: Secondary | ICD-10-CM | POA: Diagnosis not present

## 2020-01-06 DIAGNOSIS — M5126 Other intervertebral disc displacement, lumbar region: Secondary | ICD-10-CM | POA: Diagnosis not present

## 2020-01-06 LAB — COMPREHENSIVE METABOLIC PANEL
ALT: 18 U/L (ref 0–44)
AST: 23 U/L (ref 15–41)
Albumin: 4 g/dL (ref 3.5–5.0)
Alkaline Phosphatase: 102 U/L (ref 38–126)
Anion gap: 7 (ref 5–15)
BUN: 16 mg/dL (ref 8–23)
CO2: 23 mmol/L (ref 22–32)
Calcium: 9.4 mg/dL (ref 8.9–10.3)
Chloride: 106 mmol/L (ref 98–111)
Creatinine, Ser: 1.02 mg/dL (ref 0.61–1.24)
GFR calc Af Amer: >60 mL/min (ref >60)
GFR calc non Af Amer: >60 mL/min (ref >60)
Glucose, Bld: 129 mg/dL — ABNORMAL HIGH (ref 70–99)
Potassium: 4 mmol/L (ref 3.5–5.1)
Sodium: 136 mmol/L (ref 135–145)
Total Bilirubin: 0.8 mg/dL (ref 0.3–1.2)
Total Protein: 6.7 g/dL (ref 6.5–8.1)

## 2020-01-06 LAB — CBC
HCT: 43.2 % (ref 39.0–52.0)
Hemoglobin: 14.6 g/dL (ref 13.0–17.0)
MCH: 31.5 pg (ref 26.0–34.0)
MCHC: 33.8 g/dL (ref 30.0–36.0)
MCV: 93.1 fL (ref 80.0–100.0)
Platelets: 170 10*3/uL (ref 150–400)
RBC: 4.64 MIL/uL (ref 4.22–5.81)
RDW: 12.2 % (ref 11.5–15.5)
WBC: 9.4 10*3/uL (ref 4.0–10.5)
nRBC: 0 % (ref 0.0–0.2)

## 2020-01-06 LAB — URINALYSIS, COMPLETE (UACMP) WITH MICROSCOPIC
Bacteria, UA: NONE SEEN
Bilirubin Urine: NEGATIVE
Glucose, UA: NEGATIVE mg/dL
Hgb urine dipstick: NEGATIVE
Ketones, ur: NEGATIVE mg/dL
Nitrite: NEGATIVE
Protein, ur: NEGATIVE mg/dL
Specific Gravity, Urine: 1.03 (ref 1.005–1.030)
Squamous Epithelial / HPF: NONE SEEN (ref 0–5)
pH: 5 (ref 5.0–8.0)

## 2020-01-06 MED ORDER — TRAMADOL HCL 50 MG PO TABS: 50.0000 mg | ORAL_TABLET | Freq: Four times a day (QID) | ORAL | 0 refills | Status: DC | PRN

## 2020-01-06 MED ORDER — CYCLOBENZAPRINE HCL 10 MG PO TABS: 10.0000 mg | ORAL_TABLET | Freq: Three times a day (TID) | ORAL | 0 refills | Status: AC | PRN

## 2020-01-06 MED ORDER — CYCLOBENZAPRINE HCL 10 MG PO TABS
10.0000 mg | ORAL_TABLET | Freq: Once | ORAL | Status: AC
  Administered 2020-01-06: 18:00:00 10 mg via ORAL
  Filled 2020-01-06: qty 1

## 2020-01-06 MED ORDER — KETOROLAC TROMETHAMINE 30 MG/ML IJ SOLN
30.0000 mg | Freq: Once | INTRAMUSCULAR | Status: AC
  Administered 2020-01-06: 30 mg via INTRAMUSCULAR
  Filled 2020-01-06: qty 1

## 2020-01-06 NOTE — ED Provider Notes (Signed)
[facility] Emergency Department Provider Note ____________________________________________   First MD Initiated Contact with Patient 01/06/20 1735     (approximate)  I have reviewed the triage vital signs and the nursing notes.   HISTORY  Chief Complaint Back Pain    HPI JAHEIR PAISLEY is a 79 y.o. male with PMH as noted below who presents with low back pain, bilateral but worse on the right, and somewhat radiating to the right flank and down towards his groin.  It started yesterday, got better this morning, and then became worse again.  He denies any prior history of this pain.  He has no weakness or numbness in the lower extremities or difficulty walking.  He has no urinary symptoms.  He denies any trauma or injury  Past Medical History:  Diagnosis Date  . ASCVD (arteriosclerotic cardiovascular disease)   . Bladder neck obstruction   . CAD (coronary artery disease) 2007   s/p PCI to mid LAD  . COPD (chronic obstructive pulmonary disease) ([place])   . Diverticulosis   . GERD (gastroesophageal reflux disease)   . Heart attack ([place])   . History of hiatal hernia   . History of tobacco abuse   . HOH (hard of hearing)   . Hypercholesteremia   . Hypertension   . Shortness of breath dyspnea    on exertion  . Stenosis of coronary stent 2013    Patient Active Problem List   Diagnosis Date Noted  . Hypothyroidism (acquired) 11/30/2019  . Major depressive disorder, recurrent episode with melancholic features ([place]) [id number]  . History of lacunar cerebrovascular accident 07/04/2019  . Vascular dementia ([place]) 05/21/2019  . 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 Sinus bradycardia on ECG 05/20/2018  . Essential hypertension 11/19/2017  . B12 deficiency 11/22/2015  . Chronic midline thoracic back pain 11/22/2015  . DD (diverticular disease) 05/22/2015  . Barrett's esophagus 05/22/2015  . Vitamin D deficiency 05/22/2015  . Screening for prostate cancer  05/22/2015  . Colon cancer screening 05/22/2015  . H/O atrioventricular nodal ablation 10/09/2014  . History of tobacco use 06/07/2014  . H/O coronary artery bypass surgery 06/04/2014  . H/O cardiac catheterization 05/30/2014  . CAD (coronary artery disease) of artery bypass graft 04/26/2014  . Arteriosclerosis of coronary artery 04/26/2014    Past Surgical History:  Procedure Laterality Date  . ANGIOPLASTY / STENTING FEMORAL    . CARDIAC CATHETERIZATION  05/10/12   patent LAD stent w/ 50% in stent restenosis; 70% stenosis ostium of left circumflex; 75% stenosis mid left circumflex  . CATARACT EXTRACTION W/PHACO Left 12/29/2015   Procedure: CATARACT EXTRACTION PHACO AND INTRAOCULAR LENS PLACEMENT (IOC);  Surgeon: Estill Cotta, MD;  Location: [facility] ORS;  Service: Ophthalmology;  Laterality: Left;  [place]: 01:53.8 AP%: 26.2 CDE: 49.38 Lot # [id number]  . CATARACT EXTRACTION W/PHACO Right 01/26/2016   Procedure: CATARACT EXTRACTION PHACO AND INTRAOCULAR LENS PLACEMENT (IOC);  Surgeon: Estill Cotta, MD;  Location: [facility] ORS;  Service: Ophthalmology;  Laterality: Right;  [place] 01:13 AP% 25.3 CDE 33.95 fluid pack lot # [id number]  . COLONOSCOPY  02/05/14  . CORONARY ARTERY BYPASS GRAFT  06/04/14   x2 [place]  2007  . ESOPHAGOGASTRODUODENOSCOPY  02/05/14    Prior to Admission medications   Medication Sig Start Date End Date Taking? Authorizing Provider  aspirin 81 MG tablet Take 1 tablet (81 mg total) by mouth daily. 11/27/15   Crecencio Mc, MD  atorvastatin (LIPITOR) 20 MG  tablet TAKE 1 TABLET BY MOUTH ONCE DAILY AT 6PM. 12/10/19   Crecencio Mc, MD  Calcium Carb-Cholecalciferol (CALCIUM-VITAMIN D) 500-200 MG-UNIT tablet Take 1 tablet by mouth daily.    [provider]  cyclobenzaprine (FLEXERIL) 10 MG tablet Take 1 tablet (10 mg total) by mouth 3 (three) times daily as needed for up to 5 days for muscle spasms. 01/06/20 01/11/20  Arta Silence,  MD  diltiazem (CARTIA XT) 120 MG 24 hr capsule Take 1 capsule (120 mg total) by mouth daily. 08/13/19   Crecencio Mc, MD  donepezil (ARICEPT) 10 MG tablet Take 1 tablet (10 mg total) by mouth at bedtime. Taking 10mg  one time daily 11/21/19   Crecencio Mc, MD  levothyroxine (SYNTHROID) 50 MCG tablet Take 1 tablet (50 mcg total) by mouth daily. 11/30/19   Crecencio Mc, MD  metoprolol tartrate (LOPRESSOR) 50 MG tablet Take 1 tablet (50 mg total) by mouth daily. 11/06/19   Crecencio Mc, MD  nitroGLYCERIN (NITROSTAT) 0.4 MG SL tablet Place 1 tablet (0.4 mg total) under the tongue every 5 (five) minutes as needed for chest pain. 05/18/18   Crecencio Mc, MD  omeprazole (PRILOSEC) 20 MG capsule Take 1 capsule (20 mg total) by mouth daily. 11/30/19   Crecencio Mc, MD  sertraline (ZOLOFT) 50 MG tablet Take 1 tablet (50 mg total) by mouth daily. 12/25/19   Crecencio Mc, MD  sildenafil (VIAGRA) 100 MG tablet TAKE 1 TABLET BY MOUTH ONCE DAILY AS NEEDED FOR ERECTILE DYSFUNCTION 12/03/18   [provider]  telmisartan (MICARDIS) 20 MG tablet Take 1 tablet (20 mg total) by mouth at bedtime. 10/09/19   Crecencio Mc, MD  traMADol (ULTRAM) 50 MG tablet Take 1 tablet (50 mg total) by mouth every 6 (six) hours as needed. 01/06/20 01/05/21  Arta Silence, MD  vitamin E 400 UNIT capsule Take 400 Units by mouth daily.    [provider]    Allergies Patient has no known allergies.  Family History  Problem Relation Age of Onset  . Heart disease Mother   . Cancer Father 59       prostate and colon cancer    Social History Social History   Tobacco Use  . Smoking status: Former Smoker    Packs/day: 1.00    Years: 50.00    Pack years: 50.00    Types: Cigarettes    Quit date: 11/22/1993    Years since quitting: 26.1  . Smokeless tobacco: Former Systems developer    Types: Chew  . Tobacco comment: used chew to help quit smoking X1 year.  Substance Use Topics  . Alcohol use: No  . Drug  use: No    Review of Systems  Constitutional: No fever/chills. Eyes: No redness. ENT: No sore throat. Cardiovascular: Denies chest pain. Respiratory: Denies shortness of breath. Gastrointestinal: No vomiting or diarrhea.  Genitourinary: Negative for dysuria or hematuria.  Musculoskeletal: Positive for back pain. Skin: Negative for rash. Neurological: Negative for focal weakness or numbness.   ____________________________________________   PHYSICAL EXAM:  VITAL SIGNS: ED Triage Vitals  Enc Vitals Group     BP 01/06/20 1626 (!) 190/88     Pulse Rate 01/06/20 1626 74     Resp 01/06/20 1626 18     Temp 01/06/20 1626 97.7 F (36.5 C)     Temp Source 01/06/20 1626 Oral     SpO2 01/06/20 1626 98 %     Weight 01/06/20 1627 165  lb (74.8 kg)     Height 01/06/20 1627 5\' 7"  (1.702 m)     Head Circumference --      Peak Flow --      Pain Score 01/06/20 1626 10     Pain Loc --      Pain Edu? --      Excl. in [place]? --     Constitutional: Alert and oriented. Well appearing and in no acute distress. Eyes: Conjunctivae are normal.  Head: Atraumatic. Nose: No congestion/rhinnorhea. Mouth/Throat: Mucous membranes are moist.   Neck: Normal range of motion.  Cardiovascular:  Good peripheral circulation. Respiratory: Normal respiratory effort.  No retractions. Gastrointestinal: Soft and nontender. No distention.  Genitourinary: No CVA tenderness. Musculoskeletal: No lower extremity edema.  Extremities warm and well perfused.  Mild bilateral lumbar paraspinal tenderness, worse on the right.  No midline spinal tenderness. Neurologic:  Normal speech and language.  5/5 motor strength and intact sensation of bilateral lower extremities. Skin:  Skin is warm and dry. No rash noted. Psychiatric: Mood and affect are normal. Speech and behavior are normal.  ____________________________________________   LABS (all labs ordered are listed, but only abnormal results are displayed)  Labs  Reviewed  COMPREHENSIVE METABOLIC PANEL - Abnormal; Notable for the following components:      Result Value   Glucose, Bld 129 (*)    All other components within normal limits  URINALYSIS, COMPLETE (UACMP) WITH MICROSCOPIC - Abnormal; Notable for the following components:   Color, Urine YELLOW (*)    APPearance CLEAR (*)    Leukocytes,Ua TRACE (*)    All other components within normal limits  CBC   ____________________________________________  EKG   ____________________________________________  RADIOLOGY  CT abdomen: No acute abnormalities CT lumbar spine: Degenerative disease and several bulging disks  ____________________________________________   PROCEDURES  Procedure(s) performed: No  Procedures  Critical Care performed: No ____________________________________________   INITIAL IMPRESSION / ASSESSMENT AND PLAN / ED COURSE  Pertinent labs & imaging results that were available during my care of the patient were reviewed by me and considered in my medical decision making (see chart for details).  79 year old male with PMH as noted above presents with gradual onset of bilateral low back pain since yesterday, somewhat worse on the right and radiating to the right flank and groin.  He has no associated urinary symptoms, and denies any preceding trauma or excessive physical activity.  On exam, he is overall very well-appearing for his age.  His vital signs are normal except for hypertension.  Physical exam reveals mild bilateral paraspinal tenderness slightly worse on the right.  Neurologic exam is nonfocal.  Differential includes muscle strain/spasm, or possible ureteral stone.  Lab work-up is unremarkable and the UA is negative, so I do not suspect UTI/pyelonephritis.  Given the patient's age we will obtain a CT renal stone study as well as the lumbar spine.  ----------------------------------------- 7:26 PM on 01/06/2020 -----------------------------------------  CT  shows some degenerative disease and several bulging disks with foraminal stenosis, however since the patient has no neurologic findings these do not require urgent intervention or neurosurgery consultation.  The CT abdomen/pelvis also shows no significant acute findings.  The patient reports improved pain after the Toradol and Flexeril.  At this time, he is stable for discharge home.  I instructed him to follow-up with his PMD and have also given him information for neurosurgery follow-up if needed.  Return precautions given, and he expresses understanding.  ____________________________________________   FINAL CLINICAL IMPRESSION(S) /  ED DIAGNOSES  Final diagnoses:  Back pain      NEW MEDICATIONS STARTED DURING THIS VISIT:  New Prescriptions   CYCLOBENZAPRINE (FLEXERIL) 10 MG TABLET    Take 1 tablet (10 mg total) by mouth 3 (three) times daily as needed for up to 5 days for muscle spasms.   TRAMADOL (ULTRAM) 50 MG TABLET    Take 1 tablet (50 mg total) by mouth every 6 (six) hours as needed.     Note:  This document was prepared using Dragon voice recognition software and may include unintentional dictation errors.    Arta Silence, MD 01/06/20 [id number]

## 2020-01-06 NOTE — ED Triage Notes (Signed)
PT to triage with low back pain that was intermittent and has now become constant and aching.  PT denies injury.  Pt states pain is on left flank and has increased urgency.

## 2020-01-06 NOTE — Discharge Instructions (Addendum)
You have several bulging disks in your lower spine, although right now they are not causing any weakness or numbness.  You can take the tramadol as needed for pain as well as the Flexeril as a muscle relaxant.  Return to the ER for new, worsening, or persistent severe pain, any weakness or numbness in the legs, difficulty walking, problems urinating, or any other new or worsening symptoms that concern you.  Follow-up with your regular doctor.  If your symptoms continue, you can also follow-up with a spine specialist, and a referral has been provided.

## 2020-01-09 ENCOUNTER — Telehealth: Payer: Self-pay | Admitting: Internal Medicine

## 2020-01-09 MED ORDER — HYDROCODONE-ACETAMINOPHEN 10-325 MG PO TABS: 1.0000 | ORAL_TABLET | Freq: Four times a day (QID) | ORAL | 0 refills | Status: DC | PRN

## 2020-01-09 MED ORDER — PREDNISONE 10 MG PO TABS: ORAL_TABLET | ORAL | 0 refills | Status: DC

## 2020-01-09 NOTE — Telephone Encounter (Signed)
Pt's wife states that tramadol is not working for back pain. She wants to know what else he can take. Please advise.

## 2020-01-09 NOTE — Telephone Encounter (Signed)
Patient was treated in ED on Feb 14 for back pain.  CT scan done,  Disk disease noted,  Was given flexeril and tramadol,  But tramadol is making him agitated and not controlling his pain. Prednisone taper and Hydrocodone 10/325 sent to [facility] in [place] at 5:45 pm on Wednesday  Please call patient on Thursday and confirm that wfie was able to get the meds.

## 2020-01-10 NOTE — Telephone Encounter (Signed)
Spoke with pt's wife and she stated that she was able to pick up the medication yesterday. Pt has taken the first dose of prednisone and has taken the Hydrocodone once last night and once this morning. She stated that the pt is still in pain but doesn't seem to be in as much as yesterday.

## 2020-01-14 ENCOUNTER — Other Ambulatory Visit: Payer: Medicare Other

## 2020-01-16 ENCOUNTER — Other Ambulatory Visit: Payer: Self-pay

## 2020-01-17 ENCOUNTER — Ambulatory Visit (INDEPENDENT_AMBULATORY_CARE_PROVIDER_SITE_OTHER): Payer: Medicare Other | Admitting: Internal Medicine

## 2020-01-17 ENCOUNTER — Encounter: Payer: Self-pay | Admitting: Internal Medicine

## 2020-01-17 VITALS — BP 146/78 | HR 61 | Temp 97.8°F | Resp 15 | Ht 67.0 in | Wt 164.8 lb

## 2020-01-17 DIAGNOSIS — M48061 Spinal stenosis, lumbar region without neurogenic claudication: Secondary | ICD-10-CM | POA: Diagnosis not present

## 2020-01-17 DIAGNOSIS — M5416 Radiculopathy, lumbar region: Secondary | ICD-10-CM | POA: Diagnosis not present

## 2020-01-17 DIAGNOSIS — M5386 Other specified dorsopathies, lumbar region: Secondary | ICD-10-CM

## 2020-01-17 DIAGNOSIS — E039 Hypothyroidism, unspecified: Secondary | ICD-10-CM

## 2020-01-17 DIAGNOSIS — F339 Major depressive disorder, recurrent, unspecified: Secondary | ICD-10-CM | POA: Diagnosis not present

## 2020-01-17 LAB — THYROID PANEL WITH TSH
Free Thyroxine Index: 3.4 (ref 1.4–3.8)
T3 Uptake: 38 % — ABNORMAL HIGH (ref 22–35)
T4, Total: 9 ug/dL (ref 4.9–10.5)
TSH: 3.84 mIU/L (ref 0.40–4.50)

## 2020-01-17 MED ORDER — HYDROCODONE-ACETAMINOPHEN 10-325 MG PO TABS: 1.0000 | ORAL_TABLET | Freq: Four times a day (QID) | ORAL | 0 refills | Status: DC | PRN

## 2020-01-17 MED ORDER — METHYLPREDNISOLONE ACETATE 40 MG/ML IJ SUSP
40.0000 mg | Freq: Once | INTRAMUSCULAR | Status: AC
  Administered 2020-01-17: 40 mg via INTRAMUSCULAR

## 2020-01-17 MED ORDER — GABAPENTIN 100 MG PO CAPS: 100.0000 mg | ORAL_CAPSULE | Freq: Three times a day (TID) | ORAL | 3 refills | Status: DC

## 2020-01-17 NOTE — Progress Notes (Signed)
Subjective:  Patient ID: John Burns, male    DOB: 1941/10/12  Age: 79 y.o. MRN: [id number]  CC: The primary encounter diagnosis was Sciatica of left side associated with disorder of lumbar spine. Diagnoses of Hypothyroidism (acquired), Major depressive disorder, recurrent episode with melancholic features ([place]), and Spinal stenosis of lumbar region with radiculopathy were also pertinent to this visit.  HPI John Burns presents for follow up on multiple issues.   He was  evaluated and treated in the ER on Feb 14 for acute on chronic back pain with radiculopathy to right buttock and inguinal region.  CT lumbar spine which  Noted mild foraminal stenosis at multiple levels and an L4-5 disk bulge causing spinal stenosis.  He was treated with toradol and flexeril , and discharged from ER with an Rx for tramadol and flexeril.  However he has not tolerated tramadol due to agitation.  He was prescribed a prednisone taper and hydrocodone, and the pain had improved with a prednisone taper , but he has had a recurrence of severe pain after picking up his great grandchild on Saturday  Outpatient Medications Prior to Visit  Medication Sig Dispense Refill  . aspirin 81 MG tablet Take 1 tablet (81 mg total) by mouth daily. 90 tablet 1  . atorvastatin (LIPITOR) 20 MG tablet TAKE 1 TABLET BY MOUTH ONCE DAILY AT 6PM. 90 tablet 0  . Calcium Carb-Cholecalciferol (CALCIUM-VITAMIN D) 500-200 MG-UNIT tablet Take 1 tablet by mouth daily.    Marland Kitchen diltiazem (CARTIA XT) 120 MG 24 hr capsule Take 1 capsule (120 mg total) by mouth daily. 90 capsule 1  . donepezil (ARICEPT) 10 MG tablet Take 1 tablet (10 mg total) by mouth at bedtime. Taking 10mg  one time daily 30 tablet 5  . levothyroxine (SYNTHROID) 50 MCG tablet Take 1 tablet (50 mcg total) by mouth daily. 90 tablet 0  . metoprolol tartrate (LOPRESSOR) 50 MG tablet Take 1 tablet (50 mg total) by mouth daily. 90 tablet 1  . nitroGLYCERIN (NITROSTAT) 0.4 MG SL tablet  Place 1 tablet (0.4 mg total) under the tongue every 5 (five) minutes as needed for chest pain. 50 tablet 3  . omeprazole (PRILOSEC) 20 MG capsule Take 1 capsule (20 mg total) by mouth daily. 90 capsule 1  . sildenafil (VIAGRA) 100 MG tablet TAKE 1 TABLET BY MOUTH ONCE DAILY AS NEEDED FOR ERECTILE DYSFUNCTION    . telmisartan (MICARDIS) 20 MG tablet Take 1 tablet (20 mg total) by mouth at bedtime. 90 tablet 1  . vitamin E 400 UNIT capsule Take 400 Units by mouth daily.    Marland Kitchen HYDROcodone-acetaminophen (NORCO) 10-325 MG tablet Take 1 tablet by mouth every 6 (six) hours as needed. For severe back pain 30 tablet 0  . predniSONE (DELTASONE) 10 MG tablet 6 tablets daily for 3 days, then reduce by 1 tablet daily until gone (Patient not taking: Reported on 01/17/2020) 33 tablet 0  . sertraline (ZOLOFT) 50 MG tablet Take 1 tablet (50 mg total) by mouth daily. (Patient not taking: Reported on 01/17/2020) 30 tablet 3   No facility-administered medications prior to visit.    Review of Systems;  Patient denies headache, fevers, malaise, unintentional weight loss, skin rash, eye pain, sinus congestion and sinus pain, sore throat, dysphagia,  hemoptysis , cough, dyspnea, wheezing, chest pain, palpitations, orthopnea, edema, abdominal pain, nausea, melena, diarrhea, constipation, flank pain, dysuria, hematuria, urinary  Frequency, nocturia, numbness, tingling, seizures,  Focal weakness, Loss of consciousness,  Tremor, insomnia, depression,  anxiety, and suicidal ideation.      Objective:  BP (!) 146/78 (BP Location: Left Arm, Patient Position: Sitting, Cuff Size: Normal)   Pulse 61   Temp 97.8 F (36.6 C) (Temporal)   Resp 15   Ht 5\' 7"  (1.702 m)   Wt 164 lb 12.8 oz (74.8 kg)   SpO2 98%   BMI 25.81 kg/m   BP Readings from Last 3 Encounters:  01/17/20 (!) 146/78  01/06/20 (!) 149/91  12/24/19 130/60    Wt Readings from Last 3 Encounters:  01/17/20 164 lb 12.8 oz (74.8 kg)  01/06/20 165 lb (74.8 kg)    12/24/19 165 lb 6.4 oz (75 kg)    General appearance: alert, cooperative and appears stated age Ears: normal TM's and external ear canals both ears Throat: lips, mucosa, and tongue normal; teeth and gums normal Neck: no adenopathy, no carotid bruit, supple, symmetrical, trachea midline and thyroid not enlarged, symmetric, no tenderness/mass/nodules Back: symmetric, no curvature. ROM restricted to flexion to 135 degrees, and extension to 10 degrees . Extension resulted in radiation of pain to her buttock . Lungs: clear to auscultation bilaterally Heart: regular rate and rhythm, S1, S2 normal, no murmur, click, rub or gallop Abdomen: soft, non-tender; bowel sounds normal; no masses,  no organomegaly Pulses: 2+ and symmetric Skin: Skin color, texture, turgor normal. No rashes or lesions Lymph nodes: Cervical, supraclavicular, and axillary nodes normal.  No results found for: HGBA1C  Lab Results  Component Value Date   CREATININE 1.02 01/06/2020   CREATININE 0.96 11/27/2019   CREATININE 1.13 05/29/2019    Lab Results  Component Value Date   WBC 9.4 01/06/2020   HGB 14.6 01/06/2020   HCT 43.2 01/06/2020   PLT 170 01/06/2020   GLUCOSE 129 (H) 01/06/2020   CHOL 126 05/29/2019   TRIG 56.0 05/29/2019   HDL 47.90 05/29/2019   LDLCALC 67 05/29/2019   ALT 18 01/06/2020   AST 23 01/06/2020   NA 136 01/06/2020   K 4.0 01/06/2020   CL 106 01/06/2020   CREATININE 1.02 01/06/2020   BUN 16 01/06/2020   CO2 23 01/06/2020   TSH 3.84 01/17/2020   PSA 2.33 05/22/2015    CT L-SPINE NO CHARGE  Result Date: 01/06/2020 CLINICAL DATA:  Back pain. Additional history provided: Patient reports low back pain that was intermittent and has now become constant and aching, patient reports pain in left flank and increased urgency. EXAM: CT LUMBAR SPINE WITHOUT CONTRAST TECHNIQUE: Multidetector CT imaging of the lumbar spine was performed without intravenous contrast administration. Multiplanar CT  image reconstructions were also generated. COMPARISON:  No pertinent prior studies available for comparison. FINDINGS: Segmentation: For the purposes of this dictation, five lumbar vertebrae are assumed and the caudal most well-formed intervertebral disc is designated L5-S1. Alignment: Straightening of the expected lumbar lordosis. Trace L2-L3 retrolisthesis. Vertebrae: Vertebral body height is maintained. Multilevel degenerative endplate irregularity, greatest at L2-L3. Small Schmorl nodes also present within the L2 inferior endplate. No evidence of acute fracture to the lumbar spine. Paraspinal and other soft tissues: Please refer to concurrently performed CT abdomen/pelvis 4 abdominopelvic soft tissue findings. Aortoiliac atherosclerosis. Incompletely assessed left hydronephrosis. Disc levels: Moderate/advanced L2-L3 and L5-S1 disc height loss. No more than mild to moderate disc height loss at the remaining levels. T12-L1: No significant bony spinal canal or neural foraminal narrowing. L1-L2: Disc bulge. Mild facet arthrosis. Apparent mild relative spinal canal narrowing. Mild/moderate bilateral neural foraminal narrowing. L2-L3: Disc bulge with endplate spurring. Mild  facet arthrosis. Mild/moderate spinal canal stenosis with moderate bilateral neural foraminal narrowing. L3-L4: Disc bulge. Mild facet arthrosis. Apparent mild spinal canal narrowing. No significant neural foraminal narrowing. L4-L5: Disc bulge. Superimposed right center disc extrusion with mild cranial caudal migration. Mild facet arthrosis with ligamentum flavum hypertrophy. The disc protrusion contributes to right subarticular stenosis and suspected at least moderate central canal stenosis. Mild/moderate right neural foraminal narrowing. L5-S1: Disc bulge with circumferential endplate spurring. Moderate facet arthrosis. Bilateral subarticular stenosis with suspected mild central canal narrowing. Severe bilateral neural foraminal narrowing.  IMPRESSION: No evidence of acute fracture to the lumbar spine. Lumbar spondylosis as outlined and most notably as follows. At L4-L5, disc bulge with superimposed right center disc extrusion as well as facet arthrosis and ligamentum flavum hypertrophy. Right subarticular stenosis and suspected at least moderate central canal stenosis. Mild/moderate bilateral neural foraminal narrowing. At L2-L3, multifactorial mild/moderate spinal canal stenosis. At L5-S1, multifactorial bilateral subarticular stenosis with suspected mild central canal narrowing. Severe bilateral neural foraminal narrowing. Please refer to concurrently performed CT abdomen/pelvis for a description of soft tissue findings. Findings include but are not limited to aortoiliac atherosclerosis and incompletely assessed left hydronephrosis. Electronically Signed   By: Kellie Simmering DO   On: 01/06/2020 18:45   CT Renal Stone Study  Result Date: 01/06/2020 CLINICAL DATA:  PT to triage with low back pain that was intermittent and has now become constant and aching. PT denies injury. Pt states pain is on left flank and has increased urgency. EXAM: CT ABDOMEN AND PELVIS WITHOUT CONTRAST TECHNIQUE: Multidetector CT imaging of the abdomen and pelvis was performed following the standard protocol without IV contrast. COMPARISON:  None. FINDINGS: Lower chest: Motion limited evaluation. No acute finding. Small hiatal hernia. Somewhat limited evaluation of the abdominal parenchyma given the lack of IV contrast. Hepatobiliary: No focal liver abnormality is seen. No gallstones, gallbladder wall thickening, or biliary dilatation. Pancreas: Unremarkable Spleen: Normal in size without focal abnormality. Adrenals/Urinary Tract: Normal right adrenal gland. Left adrenal gland thickening likely representing hyperplasia. Kidneys are symmetric in size. No hydronephrosis. No renal calculi. Multiple left renal parapelvic cysts. Bladder is unremarkable. Stomach/Bowel: Stomach is  within normal limits. Appendix appears normal. No evidence of bowel wall thickening, distention, or inflammatory changes. Multiple colonic diverticula without evidence of diverticulitis. Vascular/Lymphatic: Severe aortoiliac atherosclerotic calcification without aneurysm. Vascular patency cannot be assessed in the absence of IV contrast. No lymphadenopathy. Reproductive: Enlarged prostate with coarse calcification. Other: No abdominal wall hernia or abnormality. No abdominopelvic ascites. Musculoskeletal: Severe degenerative disc disease at L5-S1 as well as in the lower thoracic spine. IMPRESSION: 1. No findings to explain the patient's left flank pain on a noncontrast scan. 2. Asymmetric thickening of the left adrenal gland likely representing hyperplasia. 3. Multiple colonic diverticula without evidence of diverticulitis. 4. Severe aortoiliac atherosclerotic calcification without aneurysm. 5. Severe degenerative disc disease in the lower thoracic spine as well as at L5-S1. 6. Enlarged prostate. Electronically Signed   By: Audie Pinto M.D.   On: 01/06/2020 18:37    Assessment & Plan:   Problem List Items Addressed This Visit      Unprioritized   Hypothyroidism (acquired)    TSH is therapeutic,  Continue current levothyoxine dose,  Recheck in 6 montths   Lab Results  Component Value Date   TSH 3.84 01/17/2020         Major depressive disorder, recurrent episode with melancholic features ([place])    He was prescribed sertraline  at 25 mg dose but  did not start the medication and has no interest in taking it at this time      Spinal stenosis of lumbar region with radiculopathy    Aggravated by picking up his grandchild several days ago.  Solumedrol IM given , refilling vicodin and adding gabapentin. Referral to Dr Loistine Chance for management with ESI if amenable.       Relevant Medications   gabapentin (NEURONTIN) 100 MG capsule   Other Relevant Orders   Ambulatory referral to Sports  Medicine    Other Visit Diagnoses    Sciatica of left side associated with disorder of lumbar spine    -  Primary   Relevant Medications   gabapentin (NEURONTIN) 100 MG capsule   methylPREDNISolone acetate (DEPO-MEDROL) injection 40 mg (Completed)      I have discontinued Dwan L. Corlett's sertraline and predniSONE. I have also changed his HYDROcodone-acetaminophen. Additionally, I am having him start on gabapentin. Lastly, I am having him maintain his vitamin E, aspirin, calcium-vitamin D, nitroGLYCERIN, sildenafil, diltiazem, telmisartan, metoprolol tartrate, donepezil, omeprazole, levothyroxine, and atorvastatin. We administered methylPREDNISolone acetate.  Meds ordered this encounter  Medications  . gabapentin (NEURONTIN) 100 MG capsule    Sig: Take 1 capsule (100 mg total) by mouth 3 (three) times daily.    Dispense:  90 capsule    Refill:  3  . HYDROcodone-acetaminophen (NORCO) 10-325 MG tablet    Sig: Take 1 tablet by mouth every 6 (six) hours as needed. For severe back pain.  Maximum 3 daily    Dispense:  90 tablet    Refill:  0    PATIENT IS NOT TOLERATING TRAMADOL DUE TO AGITATION AND PAIN  . methylPREDNISolone acetate (DEPO-MEDROL) injection 40 mg    Medications Discontinued During This Encounter  Medication Reason  . predniSONE (DELTASONE) 10 MG tablet Completed Course  . HYDROcodone-acetaminophen (NORCO) 10-325 MG tablet   . sertraline (ZOLOFT) 50 MG tablet     I provided  30 minutes of face-to-face time during this encounter reviewing patient's current problems and past surgeries, labs and imaging studies, providing counseling on the above mentioned problems , and coordination  of care .   Follow-up: No follow-ups on file.   Crecencio Mc, MD

## 2020-01-17 NOTE — Patient Instructions (Signed)
You received a dose of steroids today by injection.  You may use hydrocodone every 6 hours IF NEEDED to manage pailn.  MAXIMUM 3 DURING A 24 HOUR PERIOD  ADDING gabapentin 100 mg every 8 hours to help the nerve pain in your left leg  Don't forget to add  2000 mg of acetominophen (tylenol) every day safely  In divided doses (500 mg every 6 hours  Or 1000 mg every 12 hours.)  Referral to Dr Sharlet Salina at [facility] to discuss an epidural steroid injection

## 2020-01-20 DIAGNOSIS — M48061 Spinal stenosis, lumbar region without neurogenic claudication: Secondary | ICD-10-CM | POA: Insufficient documentation

## 2020-01-20 DIAGNOSIS — M5416 Radiculopathy, lumbar region: Secondary | ICD-10-CM | POA: Insufficient documentation

## 2020-01-20 NOTE — Assessment & Plan Note (Signed)
Aggravated by picking up his grandchild several days ago.  Solumedrol IM given , refilling vicodin and adding gabapentin. Referral to Dr Loistine Chance for management with ESI if amenable.

## 2020-01-20 NOTE — Assessment & Plan Note (Addendum)
He was prescribed sertraline  at 25 mg dose but did not start the medication and has no interest in taking it at this time

## 2020-01-20 NOTE — Assessment & Plan Note (Signed)
TSH is therapeutic,  Continue current levothyoxine dose,  Recheck in 6 montths   Lab Results  Component Value Date   TSH 3.84 01/17/2020

## 2020-01-29 ENCOUNTER — Ambulatory Visit (INDEPENDENT_AMBULATORY_CARE_PROVIDER_SITE_OTHER): Payer: Medicare Other

## 2020-01-29 ENCOUNTER — Telehealth: Payer: Self-pay | Admitting: Internal Medicine

## 2020-01-29 ENCOUNTER — Other Ambulatory Visit: Payer: Self-pay

## 2020-01-29 DIAGNOSIS — E538 Deficiency of other specified B group vitamins: Secondary | ICD-10-CM | POA: Diagnosis not present

## 2020-01-29 MED ORDER — CYANOCOBALAMIN 1000 MCG/ML IJ SOLN
1000.0000 ug | Freq: Once | INTRAMUSCULAR | Status: AC
  Administered 2020-01-29: 1000 ug via INTRAMUSCULAR

## 2020-01-29 NOTE — Telephone Encounter (Signed)
Pt called to get his lab results ° ° °

## 2020-01-29 NOTE — Telephone Encounter (Signed)
Pt is requesting lab results.

## 2020-01-29 NOTE — Telephone Encounter (Signed)
LMTCB

## 2020-01-29 NOTE — Telephone Encounter (Signed)
Also sent to mychart 2 weeks ago.   Comments printed

## 2020-01-29 NOTE — Progress Notes (Signed)
Patient came in today for B-12 injection in Left Deltoid IM. Patient tolerated well.

## 2020-01-31 NOTE — Telephone Encounter (Signed)
Spoke with pt's wife and informed her of his lab results. Wife stated that she already has a copy of his lab results.

## 2020-02-07 ENCOUNTER — Telehealth: Payer: Self-pay | Admitting: Internal Medicine

## 2020-02-07 NOTE — Telephone Encounter (Signed)
Pt needs a refill on diltiazem (CARTIA XT) 120 MG 24 hr capsule. Pt is out

## 2020-02-08 MED ORDER — DILTIAZEM HCL ER COATED BEADS 120 MG PO CP24: 120.0000 mg | ORAL_CAPSULE | Freq: Every day | ORAL | 1 refills | Status: DC

## 2020-02-08 NOTE — Telephone Encounter (Signed)
Medication has been refilled.

## 2020-02-13 DIAGNOSIS — M5416 Radiculopathy, lumbar region: Secondary | ICD-10-CM | POA: Diagnosis not present

## 2020-02-13 DIAGNOSIS — M5126 Other intervertebral disc displacement, lumbar region: Secondary | ICD-10-CM | POA: Diagnosis not present

## 2020-02-13 DIAGNOSIS — M48062 Spinal stenosis, lumbar region with neurogenic claudication: Secondary | ICD-10-CM | POA: Diagnosis not present

## 2020-02-15 DIAGNOSIS — M5126 Other intervertebral disc displacement, lumbar region: Secondary | ICD-10-CM | POA: Diagnosis not present

## 2020-02-15 DIAGNOSIS — M5416 Radiculopathy, lumbar region: Secondary | ICD-10-CM | POA: Diagnosis not present

## 2020-02-15 DIAGNOSIS — M48062 Spinal stenosis, lumbar region with neurogenic claudication: Secondary | ICD-10-CM | POA: Diagnosis not present

## 2020-03-03 ENCOUNTER — Telehealth: Payer: Self-pay | Admitting: Internal Medicine

## 2020-03-03 MED ORDER — LEVOTHYROXINE SODIUM 50 MCG PO TABS: 50.0000 ug | ORAL_TABLET | Freq: Every day | ORAL | 0 refills | Status: DC

## 2020-03-03 NOTE — Telephone Encounter (Signed)
Pt needs a refill on levothyroxine (SYNTHROID) 50 MCG tablet sent to [facility]

## 2020-03-04 ENCOUNTER — Ambulatory Visit (INDEPENDENT_AMBULATORY_CARE_PROVIDER_SITE_OTHER): Payer: Medicare Other

## 2020-03-04 ENCOUNTER — Other Ambulatory Visit: Payer: Self-pay

## 2020-03-04 DIAGNOSIS — E538 Deficiency of other specified B group vitamins: Secondary | ICD-10-CM | POA: Diagnosis not present

## 2020-03-04 MED ORDER — CYANOCOBALAMIN 1000 MCG/ML IJ SOLN
1000.0000 ug | Freq: Once | INTRAMUSCULAR | Status: AC
  Administered 2020-03-04: 11:00:00 1000 ug via INTRAMUSCULAR

## 2020-03-04 NOTE — Progress Notes (Signed)
Patient presented for B 12 injection to left deltoid, patient voiced no concerns nor showed any signs of distress during injection. 

## 2020-03-23 ENCOUNTER — Other Ambulatory Visit: Payer: Self-pay | Admitting: Internal Medicine

## 2020-03-24 ENCOUNTER — Other Ambulatory Visit: Payer: Self-pay

## 2020-03-26 ENCOUNTER — Encounter: Payer: Self-pay | Admitting: Internal Medicine

## 2020-03-26 ENCOUNTER — Other Ambulatory Visit: Payer: Self-pay

## 2020-03-26 ENCOUNTER — Ambulatory Visit (INDEPENDENT_AMBULATORY_CARE_PROVIDER_SITE_OTHER): Payer: Medicare Other | Admitting: Internal Medicine

## 2020-03-26 VITALS — BP 148/66 | HR 77 | Temp 97.9°F | Resp 15 | Ht 67.0 in | Wt 159.6 lb

## 2020-03-26 DIAGNOSIS — F339 Major depressive disorder, recurrent, unspecified: Secondary | ICD-10-CM

## 2020-03-26 DIAGNOSIS — R7301 Impaired fasting glucose: Secondary | ICD-10-CM

## 2020-03-26 DIAGNOSIS — E039 Hypothyroidism, unspecified: Secondary | ICD-10-CM

## 2020-03-26 DIAGNOSIS — I2510 Atherosclerotic heart disease of native coronary artery without angina pectoris: Secondary | ICD-10-CM

## 2020-03-26 DIAGNOSIS — M48061 Spinal stenosis, lumbar region without neurogenic claudication: Secondary | ICD-10-CM | POA: Diagnosis not present

## 2020-03-26 DIAGNOSIS — F0150 Vascular dementia without behavioral disturbance: Secondary | ICD-10-CM

## 2020-03-26 DIAGNOSIS — M48062 Spinal stenosis, lumbar region with neurogenic claudication: Secondary | ICD-10-CM | POA: Diagnosis not present

## 2020-03-26 DIAGNOSIS — M5416 Radiculopathy, lumbar region: Secondary | ICD-10-CM | POA: Diagnosis not present

## 2020-03-26 MED ORDER — DONEPEZIL HCL 10 MG PO TABS: 10.0000 mg | ORAL_TABLET | Freq: Every day | ORAL | 5 refills | Status: DC

## 2020-03-26 MED ORDER — TELMISARTAN 40 MG PO TABS: 40.0000 mg | ORAL_TABLET | Freq: Every day | ORAL | 0 refills | Status: DC

## 2020-03-26 NOTE — Progress Notes (Signed)
Subjective:  Patient ID: John Burns, male    DOB: [birth year]  Age: 79 y.o. MRN: [id number]  CC: The primary encounter diagnosis was Impaired fasting glucose. Diagnoses of Arteriosclerosis of coronary artery, Hypothyroidism (acquired), Major depressive disorder, recurrent episode with melancholic features [facility]), Spinal stenosis of lumbar region with radiculopathy, and Vascular dementia without behavioral disturbance ([place]) were also pertinent to this visit.  HPI John Burns presents for FOLLOW UP on vascular dementia.  Has been seen by Melrose Nakayama and started on Aricept .  Does not want to go back because Melrose Nakayama never even acknowledged him during the visit until the patient asked him to.  Per his wife,  The aricept has helped.  He is no longer asking her the same questions repeatedly .  Sciatica:  Had his 2nd ESI today by [facility].   First one  was march 26th and helped after a week of pain,  for about a week,   So far today it has eased off .  He has a follow up on May 17 th. Still has some of the #30 vicodin prescribed march 24 by [facility].  Using tylenol   2 dail y   Has lost 6 lbs.  "just not hungry"       Outpatient Medications Prior to Visit  Medication Sig Dispense Refill  . aspirin 81 MG tablet Take 1 tablet (81 mg total) by mouth daily. 90 tablet 1  . atorvastatin (LIPITOR) 20 MG tablet TAKE 1 TABLET BY MOUTH ONCE DAILY AT  6PM 90 tablet 0  . Calcium Carb-Cholecalciferol (CALCIUM-VITAMIN D) 500-200 MG-UNIT tablet Take 1 tablet by mouth daily.    Marland Kitchen diltiazem (CARTIA XT) 120 MG 24 hr capsule Take 1 capsule (120 mg total) by mouth daily. 90 capsule 1  . HYDROcodone-acetaminophen (NORCO) 10-325 MG tablet Take 1 tablet by mouth every 6 (six) hours as needed. For severe back pain.  Maximum 3 daily 90 tablet 0  . levothyroxine (SYNTHROID) 50 MCG tablet Take 1 tablet (50 mcg total) by mouth daily. 90 tablet 0  . metoprolol tartrate (LOPRESSOR) 50 MG tablet Take 1 tablet (50 mg total) by  mouth daily. 90 tablet 1  . nitroGLYCERIN (NITROSTAT) 0.4 MG SL tablet Place 1 tablet (0.4 mg total) under the tongue every 5 (five) minutes as needed for chest pain. 50 tablet 3  . omeprazole (PRILOSEC) 20 MG capsule Take 1 capsule (20 mg total) by mouth daily. 90 capsule 1  . sildenafil (VIAGRA) 100 MG tablet TAKE 1 TABLET BY MOUTH ONCE DAILY AS NEEDED FOR ERECTILE DYSFUNCTION    . vitamin E 400 UNIT capsule Take 400 Units by mouth daily.    Marland Kitchen donepezil (ARICEPT) 10 MG tablet Take 1 tablet (10 mg total) by mouth at bedtime. Taking 10mg  one time daily 30 tablet 5  . gabapentin (NEURONTIN) 100 MG capsule Take 1 capsule (100 mg total) by mouth 3 (three) times daily. 90 capsule 3  . telmisartan (MICARDIS) 20 MG tablet Take 1 tablet (20 mg total) by mouth at bedtime. 90 tablet 1   No facility-administered medications prior to visit.    Review of Systems;  Patient denies headache, fevers, malaise, unintentional weight loss, skin rash, eye pain, sinus congestion and sinus pain, sore throat, dysphagia,  hemoptysis , cough, dyspnea, wheezing, chest pain, palpitations, orthopnea, edema, abdominal pain, nausea, melena, diarrhea, constipation, flank pain, dysuria, hematuria, urinary  Frequency, nocturia, numbness, tingling, seizures,  Focal weakness, Loss of consciousness,  Tremor, insomnia, depression, anxiety,  and suicidal ideation.      Objective:  BP (!) 148/66 (BP Location: Left Arm, Patient Position: Sitting, Cuff Size: Normal)   Pulse 77   Temp 97.9 F (36.6 C) (Temporal)   Resp 15   Ht 5\' 7"  (1.702 m)   Wt 159 lb 9.6 oz (72.4 kg)   SpO2 96%   BMI 25.00 kg/m   BP Readings from Last 3 Encounters:  03/26/20 (!) 148/66  01/17/20 (!) 146/78  01/06/20 (!) 149/91    Wt Readings from Last 3 Encounters:  03/26/20 159 lb 9.6 oz (72.4 kg)  01/17/20 164 lb 12.8 oz (74.8 kg)  01/06/20 165 lb (74.8 kg)    General appearance: alert, cooperative and appears stated age Ears: normal TM's and  external ear canals both ears Throat: lips, mucosa, and tongue normal; teeth and gums normal Neck: no adenopathy, no carotid bruit, supple, symmetrical, trachea midline and thyroid not enlarged, symmetric, no tenderness/mass/nodules Back: symmetric, no curvature. ROM normal. No CVA tenderness. Lungs: clear to auscultation bilaterally Heart: regular rate and rhythm, S1, S2 normal, no murmur, click, rub or gallop Abdomen: soft, non-tender; bowel sounds normal; no masses,  no organomegaly Pulses: 2+ and symmetric Skin: Skin color, texture, turgor normal. No rashes or lesions Lymph nodes: Cervical, supraclavicular, and axillary nodes normal.  No results found for: HGBA1C  Lab Results  Component Value Date   CREATININE 1.02 01/06/2020   CREATININE 0.96 11/27/2019   CREATININE 1.13 05/29/2019    Lab Results  Component Value Date   WBC 9.4 01/06/2020   HGB 14.6 01/06/2020   HCT 43.2 01/06/2020   PLT 170 01/06/2020   GLUCOSE 129 (H) 01/06/2020   CHOL 126 05/29/2019   TRIG 56.0 05/29/2019   HDL 47.90 05/29/2019   LDLCALC 67 05/29/2019   ALT 18 01/06/2020   AST 23 01/06/2020   NA 136 01/06/2020   K 4.0 01/06/2020   CL 106 01/06/2020   CREATININE 1.02 01/06/2020   BUN 16 01/06/2020   CO2 23 01/06/2020   TSH 3.84 01/17/2020   PSA 2.33 05/22/2015    CT L-SPINE NO CHARGE  Result Date: 01/06/2020 CLINICAL DATA:  Back pain. Additional history provided: Patient reports low back pain that was intermittent and has now become constant and aching, patient reports pain in left flank and increased urgency. EXAM: CT LUMBAR SPINE WITHOUT CONTRAST TECHNIQUE: Multidetector CT imaging of the lumbar spine was performed without intravenous contrast administration. Multiplanar CT image reconstructions were also generated. COMPARISON:  No pertinent prior studies available for comparison. FINDINGS: Segmentation: For the purposes of this dictation, five lumbar vertebrae are assumed and the caudal most  well-formed intervertebral disc is designated L5-S1. Alignment: Straightening of the expected lumbar lordosis. Trace L2-L3 retrolisthesis. Vertebrae: Vertebral body height is maintained. Multilevel degenerative endplate irregularity, greatest at L2-L3. Small Schmorl nodes also present within the L2 inferior endplate. No evidence of acute fracture to the lumbar spine. Paraspinal and other soft tissues: Please refer to concurrently performed CT abdomen/pelvis 4 abdominopelvic soft tissue findings. Aortoiliac atherosclerosis. Incompletely assessed left hydronephrosis. Disc levels: Moderate/advanced L2-L3 and L5-S1 disc height loss. No more than mild to moderate disc height loss at the remaining levels. T12-L1: No significant bony spinal canal or neural foraminal narrowing. L1-L2: Disc bulge. Mild facet arthrosis. Apparent mild relative spinal canal narrowing. Mild/moderate bilateral neural foraminal narrowing. L2-L3: Disc bulge with endplate spurring. Mild facet arthrosis. Mild/moderate spinal canal stenosis with moderate bilateral neural foraminal narrowing. L3-L4: Disc bulge. Mild facet arthrosis. Apparent  mild spinal canal narrowing. No significant neural foraminal narrowing. L4-L5: Disc bulge. Superimposed right center disc extrusion with mild cranial caudal migration. Mild facet arthrosis with ligamentum flavum hypertrophy. The disc protrusion contributes to right subarticular stenosis and suspected at least moderate central canal stenosis. Mild/moderate right neural foraminal narrowing. L5-S1: Disc bulge with circumferential endplate spurring. Moderate facet arthrosis. Bilateral subarticular stenosis with suspected mild central canal narrowing. Severe bilateral neural foraminal narrowing. IMPRESSION: No evidence of acute fracture to the lumbar spine. Lumbar spondylosis as outlined and most notably as follows. At L4-L5, disc bulge with superimposed right center disc extrusion as well as facet arthrosis and  ligamentum flavum hypertrophy. Right subarticular stenosis and suspected at least moderate central canal stenosis. Mild/moderate bilateral neural foraminal narrowing. At L2-L3, multifactorial mild/moderate spinal canal stenosis. At L5-S1, multifactorial bilateral subarticular stenosis with suspected mild central canal narrowing. Severe bilateral neural foraminal narrowing. Please refer to concurrently performed CT abdomen/pelvis for a description of soft tissue findings. Findings include but are not limited to aortoiliac atherosclerosis and incompletely assessed left hydronephrosis. Electronically Signed   By: Kellie Simmering DO   On: 01/06/2020 18:45   CT Renal Stone Study  Result Date: 01/06/2020 CLINICAL DATA:  PT to triage with low back pain that was intermittent and has now become constant and aching. PT denies injury. Pt states pain is on left flank and has increased urgency. EXAM: CT ABDOMEN AND PELVIS WITHOUT CONTRAST TECHNIQUE: Multidetector CT imaging of the abdomen and pelvis was performed following the standard protocol without IV contrast. COMPARISON:  None. FINDINGS: Lower chest: Motion limited evaluation. No acute finding. Small hiatal hernia. Somewhat limited evaluation of the abdominal parenchyma given the lack of IV contrast. Hepatobiliary: No focal liver abnormality is seen. No gallstones, gallbladder wall thickening, or biliary dilatation. Pancreas: Unremarkable Spleen: Normal in size without focal abnormality. Adrenals/Urinary Tract: Normal right adrenal gland. Left adrenal gland thickening likely representing hyperplasia. Kidneys are symmetric in size. No hydronephrosis. No renal calculi. Multiple left renal parapelvic cysts. Bladder is unremarkable. Stomach/Bowel: Stomach is within normal limits. Appendix appears normal. No evidence of bowel wall thickening, distention, or inflammatory changes. Multiple colonic diverticula without evidence of diverticulitis. Vascular/Lymphatic: Severe  aortoiliac atherosclerotic calcification without aneurysm. Vascular patency cannot be assessed in the absence of IV contrast. No lymphadenopathy. Reproductive: Enlarged prostate with coarse calcification. Other: No abdominal wall hernia or abnormality. No abdominopelvic ascites. Musculoskeletal: Severe degenerative disc disease at L5-S1 as well as in the lower thoracic spine. IMPRESSION: 1. No findings to explain the patient's left flank pain on a noncontrast scan. 2. Asymmetric thickening of the left adrenal gland likely representing hyperplasia. 3. Multiple colonic diverticula without evidence of diverticulitis. 4. Severe aortoiliac atherosclerotic calcification without aneurysm. 5. Severe degenerative disc disease in the lower thoracic spine as well as at L5-S1. 6. Enlarged prostate. Electronically Signed   By: Audie Pinto M.D.   On: 01/06/2020 18:37    Assessment & Plan:   Problem List Items Addressed This Visit      Unprioritized   Arteriosclerosis of coronary artery   Relevant Medications   telmisartan (MICARDIS) 40 MG tablet   Other Relevant Orders   Lipid panel   Hypothyroidism (acquired)   Relevant Orders   TSH   Major depressive disorder, recurrent episode with melancholic features ([place])    He was prescribed sertraline  at 25 mg dose but did not start the medication and has no interest in taking it at this time.  His mood is significantly improved  compared to last visit      Spinal stenosis of lumbar region with radiculopathy    Currently under treatment by [facility] with ESI.  Pathophysiology explained  And cautioned to avoid undoing any benefits from the injections .      Relevant Medications   donepezil (ARICEPT) 10 MG tablet   Vascular dementia ([place])    Diagnosed by Melrose Nakayama.  Tolerating Aricept. Wife states that he is repeating questions less frequently than before .      Relevant Medications   donepezil (ARICEPT) 10 MG tablet    Other Visit Diagnoses    Impaired  fasting glucose    -  Primary   Relevant Orders   Hemoglobin A1c   Comprehensive metabolic panel      I have discontinued Taniela L. Hirota's gabapentin. I have also changed his telmisartan. Additionally, I am having him maintain his vitamin E, aspirin, calcium-vitamin D, nitroGLYCERIN, sildenafil, metoprolol tartrate, omeprazole, HYDROcodone-acetaminophen, diltiazem, levothyroxine, atorvastatin, and donepezil.  Meds ordered this encounter  Medications  . donepezil (ARICEPT) 10 MG tablet    Sig: Take 1 tablet (10 mg total) by mouth at bedtime. Taking 10mg  one time daily    Dispense:  30 tablet    Refill:  5    KEEP ON FILE FOR FUTURE REFILLS  . telmisartan (MICARDIS) 40 MG tablet    Sig: Take 1 tablet (40 mg total) by mouth at bedtime.    Dispense:  90 tablet    Refill:  0    Medications Discontinued During This Encounter  Medication Reason  . donepezil (ARICEPT) 10 MG tablet Reorder  . gabapentin (NEURONTIN) 100 MG capsule   . telmisartan (MICARDIS) 20 MG tablet    I provided  30 minutes of  face-to-face time during this encounter reviewing patient's current problems and past surgeries, labs and imaging studies, providing counseling on the above mentioned problems , and coordination  of care .  Follow-up: Return in about 6 months (around 09/26/2020).   Crecencio Mc, MD

## 2020-03-26 NOTE — Patient Instructions (Addendum)
I have refilled your Aricept  And will do so from now on    You can take  up to 2000 mg of acetaminophen (tylenol) every day safely  In divided doses (500 mg every 6 hours  Or 1000 mg every 12 hours.)   You have spinal stenosis.  BENDING OVER MAKES IT WORSE!!    I AM INCREASING YOUR TELMISARTAN TO 40 MG TO LOWER YOUR BLOOD PRESSURE  OUR GOAL IS 130/80 OR LESS   RETURN FOR LABS IN [place]

## 2020-03-27 NOTE — Assessment & Plan Note (Signed)
Diagnosed by Melrose Nakayama.  Tolerating Aricept. Wife states that he is repeating questions less frequently than before .

## 2020-03-27 NOTE — Assessment & Plan Note (Signed)
Currently under treatment by [facility] with ESI.  Pathophysiology explained  And cautioned to avoid undoing any benefits from the injections .

## 2020-03-27 NOTE — Assessment & Plan Note (Signed)
He was prescribed sertraline  at 25 mg dose but did not start the medication and has no interest in taking it at this time.  His mood is significantly improved compared to last visit 

## 2020-04-03 ENCOUNTER — Other Ambulatory Visit: Payer: Self-pay

## 2020-04-03 ENCOUNTER — Ambulatory Visit (INDEPENDENT_AMBULATORY_CARE_PROVIDER_SITE_OTHER): Payer: Medicare Other

## 2020-04-03 DIAGNOSIS — E538 Deficiency of other specified B group vitamins: Secondary | ICD-10-CM

## 2020-04-03 MED ORDER — CYANOCOBALAMIN 1000 MCG/ML IJ SOLN
1000.0000 ug | Freq: Once | INTRAMUSCULAR | Status: AC
  Administered 2020-04-03: 1000 ug via INTRAMUSCULAR

## 2020-04-03 NOTE — Progress Notes (Addendum)
Patient presented for B 12 injection to left deltoid, patient voiced no concerns nor showed any signs of distress during injection.  Reviewed.  Dr Scott 

## 2020-04-07 ENCOUNTER — Other Ambulatory Visit: Payer: Self-pay | Admitting: Physical Medicine and Rehabilitation

## 2020-04-07 DIAGNOSIS — M5126 Other intervertebral disc displacement, lumbar region: Secondary | ICD-10-CM | POA: Diagnosis not present

## 2020-04-07 DIAGNOSIS — M5442 Lumbago with sciatica, left side: Secondary | ICD-10-CM | POA: Diagnosis not present

## 2020-04-07 DIAGNOSIS — M5416 Radiculopathy, lumbar region: Secondary | ICD-10-CM | POA: Diagnosis not present

## 2020-04-07 DIAGNOSIS — M48062 Spinal stenosis, lumbar region with neurogenic claudication: Secondary | ICD-10-CM | POA: Diagnosis not present

## 2020-04-08 DIAGNOSIS — I25708 Atherosclerosis of coronary artery bypass graft(s), unspecified, with other forms of angina pectoris: Secondary | ICD-10-CM | POA: Diagnosis not present

## 2020-04-08 DIAGNOSIS — I471 Supraventricular tachycardia: Secondary | ICD-10-CM | POA: Diagnosis not present

## 2020-04-08 DIAGNOSIS — I2510 Atherosclerotic heart disease of native coronary artery without angina pectoris: Secondary | ICD-10-CM | POA: Diagnosis not present

## 2020-04-08 DIAGNOSIS — J449 Chronic obstructive pulmonary disease, unspecified: Secondary | ICD-10-CM | POA: Diagnosis not present

## 2020-04-08 DIAGNOSIS — R0602 Shortness of breath: Secondary | ICD-10-CM | POA: Diagnosis not present

## 2020-04-08 DIAGNOSIS — Z9889 Other specified postprocedural states: Secondary | ICD-10-CM | POA: Diagnosis not present

## 2020-04-08 DIAGNOSIS — E785 Hyperlipidemia, unspecified: Secondary | ICD-10-CM | POA: Diagnosis not present

## 2020-04-08 DIAGNOSIS — Z951 Presence of aortocoronary bypass graft: Secondary | ICD-10-CM | POA: Diagnosis not present

## 2020-04-18 ENCOUNTER — Other Ambulatory Visit: Payer: Self-pay

## 2020-04-18 ENCOUNTER — Ambulatory Visit
Admission: RE | Admit: 2020-04-18 | Discharge: 2020-04-18 | Disposition: A | Discharge status: Home / Self Care | Payer: Medicare Other | Source: Ambulatory Visit | Attending: Physical Medicine and Rehabilitation | Admitting: Physical Medicine and Rehabilitation

## 2020-04-18 DIAGNOSIS — M5442 Lumbago with sciatica, left side: Secondary | ICD-10-CM | POA: Diagnosis not present

## 2020-04-18 DIAGNOSIS — M545 Low back pain: Secondary | ICD-10-CM | POA: Diagnosis not present

## 2020-04-30 DIAGNOSIS — M5416 Radiculopathy, lumbar region: Secondary | ICD-10-CM | POA: Diagnosis not present

## 2020-04-30 DIAGNOSIS — M5126 Other intervertebral disc displacement, lumbar region: Secondary | ICD-10-CM | POA: Diagnosis not present

## 2020-04-30 DIAGNOSIS — M48062 Spinal stenosis, lumbar region with neurogenic claudication: Secondary | ICD-10-CM | POA: Diagnosis not present

## 2020-05-06 ENCOUNTER — Ambulatory Visit (INDEPENDENT_AMBULATORY_CARE_PROVIDER_SITE_OTHER): Payer: Medicare Other

## 2020-05-06 ENCOUNTER — Other Ambulatory Visit: Payer: Self-pay

## 2020-05-06 DIAGNOSIS — E538 Deficiency of other specified B group vitamins: Secondary | ICD-10-CM

## 2020-05-06 MED ORDER — CYANOCOBALAMIN 1000 MCG/ML IJ SOLN
1000.0000 ug | Freq: Once | INTRAMUSCULAR | Status: AC
  Administered 2020-05-06: 1000 ug via INTRAMUSCULAR

## 2020-05-06 NOTE — Progress Notes (Signed)
Patient presented for B 12 injection to right deltoid, patient voiced no concerns nor showed any signs of distress during injection. 

## 2020-05-13 ENCOUNTER — Other Ambulatory Visit: Payer: Self-pay | Admitting: Internal Medicine

## 2020-05-14 ENCOUNTER — Telehealth: Payer: Self-pay | Admitting: Internal Medicine

## 2020-05-14 NOTE — Telephone Encounter (Signed)
Spoken to patients wife, medication look has change. Patient wanted to make sure the generic was correct.

## 2020-05-14 NOTE — Telephone Encounter (Signed)
Patient's wife called about her husbands medication,  telmisartan (MICARDIS) 40 MG table and donepezil (ARICEPT) 10 MG tablet. Wife thinks that half the time he is not taking medication and she is not sure if he is taking it the correct way. Please call her.

## 2020-05-29 ENCOUNTER — Other Ambulatory Visit: Payer: Self-pay | Admitting: Internal Medicine

## 2020-05-31 ENCOUNTER — Other Ambulatory Visit: Payer: Self-pay | Admitting: Internal Medicine

## 2020-06-05 ENCOUNTER — Ambulatory Visit (INDEPENDENT_AMBULATORY_CARE_PROVIDER_SITE_OTHER): Payer: Medicare Other

## 2020-06-05 ENCOUNTER — Other Ambulatory Visit: Payer: Self-pay

## 2020-06-05 DIAGNOSIS — E538 Deficiency of other specified B group vitamins: Secondary | ICD-10-CM | POA: Diagnosis not present

## 2020-06-05 MED ORDER — CYANOCOBALAMIN 1000 MCG/ML IJ SOLN
1000.0000 ug | Freq: Once | INTRAMUSCULAR | Status: AC
  Administered 2020-06-05: 1000 ug via INTRAMUSCULAR

## 2020-06-05 MED ORDER — CYANOCOBALAMIN 1000 MCG/ML IJ SOLN
1000.0000 ug | Freq: Once | INTRAMUSCULAR | Status: AC
  Administered 2020-12-30: 1000 ug via INTRAMUSCULAR

## 2020-06-05 NOTE — Progress Notes (Signed)
Patient presented for B 12 injection to right deltoid, patient voiced no concerns nor showed any signs of distress during injection. 

## 2020-06-16 DIAGNOSIS — M48062 Spinal stenosis, lumbar region with neurogenic claudication: Secondary | ICD-10-CM | POA: Diagnosis not present

## 2020-06-16 DIAGNOSIS — M5416 Radiculopathy, lumbar region: Secondary | ICD-10-CM | POA: Diagnosis not present

## 2020-06-16 DIAGNOSIS — M5126 Other intervertebral disc displacement, lumbar region: Secondary | ICD-10-CM | POA: Diagnosis not present

## 2020-06-17 DIAGNOSIS — L578 Other skin changes due to chronic exposure to nonionizing radiation: Secondary | ICD-10-CM | POA: Diagnosis not present

## 2020-06-17 DIAGNOSIS — Z872 Personal history of diseases of the skin and subcutaneous tissue: Secondary | ICD-10-CM | POA: Diagnosis not present

## 2020-06-17 DIAGNOSIS — L570 Actinic keratosis: Secondary | ICD-10-CM | POA: Diagnosis not present

## 2020-06-22 ENCOUNTER — Other Ambulatory Visit: Payer: Self-pay | Admitting: Internal Medicine

## 2020-07-09 ENCOUNTER — Ambulatory Visit (INDEPENDENT_AMBULATORY_CARE_PROVIDER_SITE_OTHER): Payer: Medicare Other

## 2020-07-09 ENCOUNTER — Other Ambulatory Visit: Payer: Self-pay

## 2020-07-09 ENCOUNTER — Other Ambulatory Visit (INDEPENDENT_AMBULATORY_CARE_PROVIDER_SITE_OTHER): Payer: Medicare Other

## 2020-07-09 DIAGNOSIS — E039 Hypothyroidism, unspecified: Secondary | ICD-10-CM | POA: Diagnosis not present

## 2020-07-09 DIAGNOSIS — I2510 Atherosclerotic heart disease of native coronary artery without angina pectoris: Secondary | ICD-10-CM | POA: Diagnosis not present

## 2020-07-09 DIAGNOSIS — E538 Deficiency of other specified B group vitamins: Secondary | ICD-10-CM | POA: Diagnosis not present

## 2020-07-09 DIAGNOSIS — R7301 Impaired fasting glucose: Secondary | ICD-10-CM

## 2020-07-09 LAB — COMPREHENSIVE METABOLIC PANEL
ALT: 11 U/L (ref 0–53)
AST: 16 U/L (ref 0–37)
Albumin: 4 g/dL (ref 3.5–5.2)
Alkaline Phosphatase: 111 U/L (ref 39–117)
BUN: 17 mg/dL (ref 6–23)
CO2: 25 mEq/L (ref 19–32)
Calcium: 9.6 mg/dL (ref 8.4–10.5)
Chloride: 107 mEq/L (ref 96–112)
Creatinine, Ser: 1.24 mg/dL (ref 0.40–1.50)
GFR: 56.14 mL/min — ABNORMAL LOW (ref >60.00)
Glucose, Bld: 94 mg/dL (ref 70–99)
Potassium: 4.2 mEq/L (ref 3.5–5.1)
Sodium: 139 mEq/L (ref 135–145)
Total Bilirubin: 0.7 mg/dL (ref 0.2–1.2)
Total Protein: 6.5 g/dL (ref 6.0–8.3)

## 2020-07-09 LAB — TSH: TSH: 2.65 u[IU]/mL (ref 0.35–4.50)

## 2020-07-09 LAB — HEMOGLOBIN A1C: Hgb A1c MFr Bld: 5.8 % (ref 4.6–6.5)

## 2020-07-09 LAB — LIPID PANEL
Cholesterol: 154 mg/dL (ref 0–200)
HDL: 45.5 mg/dL (ref >39.00)
LDL Cholesterol: 95 mg/dL (ref 0–99)
NonHDL: 108.45
Total CHOL/HDL Ratio: 3
Triglycerides: 68 mg/dL (ref 0.0–149.0)
VLDL: 13.6 mg/dL (ref 0.0–40.0)

## 2020-07-09 MED ORDER — CYANOCOBALAMIN 1000 MCG/ML IJ SOLN
1000.0000 ug | Freq: Once | INTRAMUSCULAR | Status: AC
  Administered 2020-07-09: 1000 ug via INTRAMUSCULAR

## 2020-07-09 NOTE — Progress Notes (Signed)
Patient presented for B 12 injection to left deltoid, patient voiced no concerns nor showed any signs of distress during injection. 

## 2020-08-03 ENCOUNTER — Other Ambulatory Visit: Payer: Self-pay | Admitting: Internal Medicine

## 2020-08-12 ENCOUNTER — Other Ambulatory Visit: Payer: Self-pay

## 2020-08-12 ENCOUNTER — Ambulatory Visit (INDEPENDENT_AMBULATORY_CARE_PROVIDER_SITE_OTHER): Payer: Medicare Other

## 2020-08-12 DIAGNOSIS — E538 Deficiency of other specified B group vitamins: Secondary | ICD-10-CM | POA: Diagnosis not present

## 2020-08-12 MED ORDER — CYANOCOBALAMIN 1000 MCG/ML IJ SOLN
1000.0000 ug | Freq: Once | INTRAMUSCULAR | Status: AC
  Administered 2020-08-12: 1000 ug via INTRAMUSCULAR

## 2020-08-12 NOTE — Progress Notes (Signed)
Patient presented for B 12 injection to left deltoid, patient voiced no concerns nor showed any signs of distress during injection. 

## 2020-08-13 ENCOUNTER — Other Ambulatory Visit: Payer: Self-pay | Admitting: Internal Medicine

## 2020-08-24 ENCOUNTER — Other Ambulatory Visit: Payer: Self-pay | Admitting: Internal Medicine

## 2020-09-04 ENCOUNTER — Other Ambulatory Visit: Payer: Self-pay | Admitting: Internal Medicine

## 2020-09-07 ENCOUNTER — Other Ambulatory Visit: Payer: Self-pay | Admitting: Internal Medicine

## 2020-09-16 ENCOUNTER — Other Ambulatory Visit: Payer: Self-pay

## 2020-09-16 ENCOUNTER — Other Ambulatory Visit: Payer: Self-pay | Admitting: Internal Medicine

## 2020-09-16 ENCOUNTER — Ambulatory Visit (INDEPENDENT_AMBULATORY_CARE_PROVIDER_SITE_OTHER): Payer: Medicare Other

## 2020-09-16 DIAGNOSIS — E538 Deficiency of other specified B group vitamins: Secondary | ICD-10-CM | POA: Diagnosis not present

## 2020-09-16 DIAGNOSIS — Z23 Encounter for immunization: Secondary | ICD-10-CM | POA: Diagnosis not present

## 2020-09-16 MED ORDER — CYANOCOBALAMIN 1000 MCG/ML IJ SOLN
1000.0000 ug | Freq: Once | INTRAMUSCULAR | Status: AC
  Administered 2020-09-16: 1000 ug via INTRAMUSCULAR

## 2020-09-16 NOTE — Progress Notes (Signed)
Patient presented for B 12 injection to right deltoid, patient voiced no concerns nor showed any signs of distress during injection. 

## 2020-09-29 ENCOUNTER — Encounter: Payer: Self-pay | Admitting: Internal Medicine

## 2020-09-29 ENCOUNTER — Ambulatory Visit (INDEPENDENT_AMBULATORY_CARE_PROVIDER_SITE_OTHER): Payer: Medicare Other | Admitting: Internal Medicine

## 2020-09-29 ENCOUNTER — Other Ambulatory Visit: Payer: Self-pay

## 2020-09-29 VITALS — BP 120/56 | HR 63 | Temp 97.8°F | Resp 16 | Ht 67.0 in | Wt 160.8 lb

## 2020-09-29 DIAGNOSIS — I2510 Atherosclerotic heart disease of native coronary artery without angina pectoris: Secondary | ICD-10-CM | POA: Diagnosis not present

## 2020-09-29 DIAGNOSIS — M5416 Radiculopathy, lumbar region: Secondary | ICD-10-CM

## 2020-09-29 DIAGNOSIS — M5412 Radiculopathy, cervical region: Secondary | ICD-10-CM | POA: Diagnosis not present

## 2020-09-29 DIAGNOSIS — I10 Essential (primary) hypertension: Secondary | ICD-10-CM

## 2020-09-29 DIAGNOSIS — F339 Major depressive disorder, recurrent, unspecified: Secondary | ICD-10-CM | POA: Diagnosis not present

## 2020-09-29 DIAGNOSIS — I25810 Atherosclerosis of coronary artery bypass graft(s) without angina pectoris: Secondary | ICD-10-CM | POA: Diagnosis not present

## 2020-09-29 DIAGNOSIS — M48061 Spinal stenosis, lumbar region without neurogenic claudication: Secondary | ICD-10-CM | POA: Diagnosis not present

## 2020-09-29 DIAGNOSIS — K22719 Barrett's esophagus with dysplasia, unspecified: Secondary | ICD-10-CM | POA: Diagnosis not present

## 2020-09-29 DIAGNOSIS — F0150 Vascular dementia without behavioral disturbance: Secondary | ICD-10-CM | POA: Diagnosis not present

## 2020-09-29 DIAGNOSIS — E039 Hypothyroidism, unspecified: Secondary | ICD-10-CM | POA: Diagnosis not present

## 2020-09-29 MED ORDER — TETANUS-DIPHTH-ACELL PERTUSSIS 5-2.5-18.5 LF-MCG/0.5 IM SUSY: 0.5000 mL | PREFILLED_SYRINGE | Freq: Once | INTRAMUSCULAR | 0 refills | Status: AC

## 2020-09-29 MED ORDER — ZOSTER VAC RECOMB ADJUVANTED 50 MCG/0.5ML IM SUSR: 0.5000 mL | Freq: Once | INTRAMUSCULAR | 1 refills | Status: AC

## 2020-09-29 MED ORDER — ATORVASTATIN CALCIUM 40 MG PO TABS
40.0000 mg | ORAL_TABLET | Freq: Every day | ORAL | 1 refills | Status: DC
Start: 2020-09-29 — End: 2021-11-18

## 2020-09-29 NOTE — Patient Instructions (Addendum)
I recommend that you  Take up to 2000 mg of acetominophen (tylenol) every day safely  In divided doses 1000 mg every 12 hours,   9 am and 9 pm   For your back and your neck pain   I recommend getting back on Plavix to reduce your risk of future stroke, given the findings of past strokes  on the MRI brain  .  You can discuss this with DR Paraschos at your upcoming visit   I want you to increase your atorvastatin (Lipitor) to 40 mg daily (2 pills x 20 mg each) .  because your LDL is too high   We will repeat fasting lipids in February   You are due to get your tetanus and shingles vaccines. . I sent them to [facility]       [name] if you want an MRI thoracic spine let me know and I will order it

## 2020-09-29 NOTE — Progress Notes (Signed)
Subjective:  Patient ID: John Burns, male    DOB: 07-05-1941  Age: 79 y.o. MRN: [id number]  CC: The primary encounter diagnosis was Hypothyroidism (acquired). 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  HPI John Burns presents for FOLLOW UP ON multiple chronic conditions including  Low back pain ,  Vascular dementia , and depression .  He is accompanied by his wife John Burns.   This visit occurred during the SARS-CoV-2 public health emergency.  Safety protocols were in place, including screening questions prior to the visit, additional usage of staff PPE, and extensive cleaning of exam room while observing appropriate contact time as indicated for disinfecting solutions.   Patient has received both doses of the available COVID 19 vaccine without complications.  Patient continues to mask when outside of the home except when walking in yard or at safe distances from others .  Patient denies any change in mood or development of unhealthy behaviors resuting from the pandemic's restriction of activities and socialization.irit  John Burns is a 79 yr old male with lumbar spondylosis and chronic back pain ,  Recent diagnosis of vascular dementia with depression,  Hypothyroidism and hypertension who presents for follow up.  He is in very good  Spirits today.   Back pain : "I live with it"  .  Reports that the pain has improved after receiving his 3rd ESI in May .  Not taking any analgesics or NSAIDs on a daily basis.   Recurrent neck pain and headaches  Aggravated by sleeping on a flat pillow.   Wife reports that he wakes up complaining of sharp pains that radiate to his scalp and shoulder   Dementia/depression:  There has been no progression of cognitive deficits.  He does not remember what medications he takes.  Wife denies agitation and anhedonia.  Continues to enjoy working in garden/yard.  No longer taking SSRI  Hypertension: patient checks blood pressure once  weekly at home.  Readings have been for the most part < 140/80 at rest . Patient is following a reduced salt diet most days and is taking medications as prescribed      Outpatient Medications Prior to Visit  Medication Sig Dispense Refill  . aspirin 81 MG tablet Take 1 tablet (81 mg total) by mouth daily. 90 tablet 1  . Calcium Carb-Cholecalciferol (CALCIUM-VITAMIN D) 500-200 MG-UNIT tablet Take 1 tablet by mouth daily.    Marland Kitchen CARTIA XT 120 MG 24 hr capsule TAKE 1 CAPSULE (120MG  TOTAL) BY MOUTH ONCE DAILY 90 capsule 0  . donepezil (ARICEPT) 10 MG tablet Take 1 tablet (10 mg total) by mouth at bedtime. Taking 10mg  one time daily 30 tablet 5  . EUTHYROX 50 MCG tablet Take 1 tablet by mouth once daily 90 tablet 0  . HYDROcodone-acetaminophen (NORCO) 10-325 MG tablet Take 1 tablet by mouth every 6 (six) hours as needed. For severe back pain.  Maximum 3 daily 90 tablet 0  . metoprolol tartrate (LOPRESSOR) 50 MG tablet Take 1 tablet by mouth once daily 90 tablet 0  . nitroGLYCERIN (NITROSTAT) 0.4 MG SL tablet Place 1 tablet (0.4 mg total) under the tongue every 5 (five) minutes as needed for chest pain. [place]  tablet 3  . omeprazole (PRILOSEC) 20 MG capsule Take 1 capsule by mouth once daily 90 capsule 0  . sildenafil (VIAGRA) 100 MG tablet TAKE 1 TABLET BY MOUTH ONCE DAILY AS NEEDED FOR ERECTILE DYSFUNCTION    . telmisartan (MICARDIS) 40 MG tablet TAKE 1 TABLET BY MOUTH AT BEDTIME 90 tablet 0  . vitamin E 400 UNIT capsule Take 400 Units by mouth daily.    Marland Kitchen atorvastatin (LIPITOR) 20 MG tablet TAKE 1 TABLET BY MOUTH ONCE DAILY AT  6PM  90 tablet 3   Facility-Administered Medications Prior to Visit  Medication Dose Route Frequency Provider Last Rate Last Admin  . cyanocobalamin ((VITAMIN B-12)) injection 1,000 mcg  1,000 mcg Intramuscular Once Crecencio Mc, MD        Review of Systems;  Patient denies headache, fevers, malaise, unintentional weight loss, skin rash, eye pain, sinus congestion and sinus pain, sore throat, dysphagia,  hemoptysis , cough, dyspnea, wheezing, chest pain, palpitations, orthopnea, edema, abdominal pain, nausea, melena, diarrhea, constipation, flank pain, dysuria, hematuria, urinary  Frequency, nocturia, numbness, tingling, seizures,  Focal weakness, Loss of consciousness,  Tremor, insomnia, depression, anxiety, and suicidal ideation.      Objective:  BP (!) 120/56 (BP Location: Left Arm, Patient Position: Sitting, Cuff Size: Normal)   Pulse 63   Temp 97.8 F (36.6 C) (Oral)   Resp 16   Ht 5\' 7"  (1.702 m)   Wt 160 lb 12.8 oz (72.9 kg)   SpO2 98%   BMI 25.18 kg/m   BP Readings from Last 3 Encounters:  09/29/20 (!) 120/56  03/26/20 (!) 148/66  01/17/20 (!) 146/78    Wt Readings from Last 3 Encounters:  09/29/20 160 lb 12.8 oz (72.9 kg)  03/26/20 159 lb 9.6 oz (72.4 kg)  01/17/20 164 lb 12.8 oz (74.8 kg)    General appearance: alert, cooperative and appears stated age Ears: normal TM's and external ear canals both ears Throat: lips, mucosa, and tongue normal; teeth and gums normal Neck: no adenopathy, no carotid bruit, supple, symmetrical, trachea midline and thyroid not enlarged, symmetric, no tenderness/mass/nodules Back: symmetric, no curvature. ROM normal. No CVA tenderness. Lungs: clear to auscultation bilaterally Heart: regular rate and rhythm, S1, S2 normal, no murmur, click, rub or gallop Abdomen: soft, non-tender; bowel sounds normal; no masses,  no organomegaly Pulses: 2+ and symmetric Skin: Skin color, texture, turgor normal. No rashes or lesions Lymph nodes:  Cervical, supraclavicular, and axillary nodes normal.  Lab Results  Component Value Date   HGBA1C 5.8 07/09/2020    Lab Results  Component Value Date   CREATININE 1.24 07/09/2020   CREATININE 1.02 01/06/2020   CREATININE 0.96 11/27/2019    Lab Results  Component Value Date   WBC 9.4 01/06/2020   HGB 14.6 01/06/2020   HCT 43.2 01/06/2020   PLT 170 01/06/2020   GLUCOSE 94 07/09/2020   CHOL 154 07/09/2020   TRIG 68.0 07/09/2020   HDL 45.50 07/09/2020   LDLCALC 95 07/09/2020   ALT 11 07/09/2020   AST 16 07/09/2020   NA 139 07/09/2020   K 4.2 07/09/2020   CL 107 07/09/2020   CREATININE 1.24 07/09/2020   BUN 17 07/09/2020   CO2 25 07/09/2020   TSH 2.65 07/09/2020   PSA 2.33 05/22/2015   HGBA1C 5.8 07/09/2020    John LUMBAR SPINE WO CONTRAST  Result Date: 04/19/2020 CLINICAL DATA:  Chronic progressive left-sided low back pain and left leg pain. EXAM: MRI LUMBAR SPINE WITHOUT CONTRAST TECHNIQUE:  Multiplanar, multisequence John imaging of the lumbar spine was performed. No intravenous contrast was administered. COMPARISON:  CT scan of the lumbar spine dated 01/06/2020 FINDINGS: Segmentation:  5 lumbar type vertebra. Alignment:  Physiologic. Vertebrae:  No fracture, evidence of discitis, or bone lesion. Conus medullaris and cauda equina: Conus extends to the L1-2 level. Small lipoma of the filum terminalis. Paraspinal and other soft tissues: Sigmoid diverticulosis. Benign left parapelvic renal cysts. Otherwise negative. Disc levels: T12-L1: Disc desiccation.  Otherwise normal. L1-2: Disc desiccation. Small broad-based disc bulge with a tiny protrusion into the inferior aspect of the left neural foramen without neural impingement. Otherwise normal. L2-3: Disc desiccation with degenerative changes of the vertebral endplates. Soft disc extrusion extending into the superior aspect of the left neural foramen which should affect the left L2 nerve. Small adjacent soft disc protrusion which extends  across the midline to the right. Slight symmetrical compression of the thecal sac without spinal stenosis. L3-4: Disc desiccation. Tiny central disc bulge with an annular fissure. No neural impingement. L4-5: Small broad-based disc bulge with a central annular fissure with small protrusion extending into the right lateral recess and right neural foramen. Symmetrical slight compression of the thecal sac without focal neural impingement. No foraminal stenosis. L5-S1: Chronic disc space narrowing. Broad-based disc osteophyte complex extending into the inferior aspects of both neural foramina with moderate bilateral foraminal stenosis. The right L5 nerve appears to exit without impingement. The left L5 nerve appears slightly compressed under the lateral aspect of the pedicle on image 14 of series 5. Slight hypertrophy of the ligamentum flavum without neural impingement. IMPRESSION: 1. Soft disc extrusion at L2-3 extending into the superior aspect of the left neural foramen which should affect the left L2 nerve. 2. Moderate bilateral foraminal stenosis at L5-S1 which could affect either or both L5 nerves. Electronically Signed   By: Lorriane Shire M.D.   On: 04/19/2020 16:30    Assessment & Plan:   Problem List Items Addressed This Visit      Unprioritized   Arteriosclerosis of coronary artery    LDL and triglycerides are not at  goal on 20 mg atorvastatin.  Recommended increasing dose to 40 mg daily . continuining daily asa  And resuming plavix.   Lab Results  Component Value Date   CHOL 154 07/09/2020   HDL 45.50 07/09/2020   LDLCALC 95 07/09/2020   TRIG 68.0 07/09/2020   CHOLHDL 3 07/09/2020   Lab Results  Component Value Date   ALT 11 07/09/2020   AST 16 07/09/2020   ALKPHOS 111 07/09/2020   BILITOT 0.7 07/09/2020         Relevant Medications   atorvastatin (LIPITOR) 40 MG tablet   Other Relevant Orders   Lipid panel   Barrett's esophagus    He is reminded to continue daily PPI        CAD (coronary artery disease) of artery bypass graft    He remains appropriately active for is age and  Denies chest pain with activities.  I  recommend resuming plavix given his history of stent in 2007 and the presence of lacunar strokes on his recent brain MRI.  He prefers to defer to his cardiologist.       Relevant Medications   atorvastatin (LIPITOR) 40 MG tablet   Essential hypertension    Well controlled on current regimen of telmisartan 40 mg daily,  cartia XT 120 mg daily  . Renal function stable, no changes today.  Relevant Medications   atorvastatin (LIPITOR) 40 MG tablet   Other Relevant Orders   Comprehensive metabolic panel   Hypothyroidism (acquired) - Primary    TSH is therapeutic,  Continue current levothyoxine dose,  Recheck in 6 montths   Lab Results  Component Value Date   TSH 2.65 07/09/2020         Major depressive disorder, recurrent episode with melancholic features ([place])    He was prescribed sertraline  at 25 mg dose but did not start the medication and has no interest in taking it at this time.  His mood is significantly improved compared to last visit      Radiculitis, cervical    Recommend adding 1000 mg tylenol q 12 hours for episodic neck pain that is suggestive of cervical disk disease.       Spinal stenosis of lumbar region with radiculopathy    His chronic pain has improved s/p ESI x 3 by [facility],  Last one in May 2021. Recommend adding 1000 mg tylenol q 12 hours for neck pain that is suggestive of cervical disk disease.       Vascular dementia [facility])    His wife manages his medications and accompanies him to visits.  Continue aricept.  Recommend tighter control of lipids and resuming of plavix         I have changed Tommey L. Sieler's atorvastatin. I am also having him start on Tdap and Zoster Vaccine Adjuvanted. Additionally, I am having him maintain his vitamin E, aspirin, calcium-vitamin D, nitroGLYCERIN, sildenafil,  HYDROcodone-acetaminophen, donepezil, Cartia XT, telmisartan, metoprolol tartrate, Euthyrox, and omeprazole. We will continue to administer cyanocobalamin.  Meds ordered this encounter  Medications  . Tdap (BOOSTRIX) 5-2.5-18.5 LF-MCG/0.5 injection    Sig: Inject 0.5 mLs into the muscle once for 1 dose.    Dispense:  0.5 mL    Refill:  0  . Zoster Vaccine Adjuvanted [facility]) injection    Sig: Inject 0.5 mLs into the muscle once for 1 dose.    Dispense:  1 each    Refill:  1  . atorvastatin (LIPITOR) 40 MG tablet    Sig: Take 1 tablet (40 mg total) by mouth daily.    Dispense:  90 tablet    Refill:  1    DO NOT FILL NOW.Marland Kitchen  KEEP ON FILE FOR FUTURE REFILL REQUEST    Medications Discontinued During This Encounter  Medication Reason  . atorvastatin (LIPITOR) 20 MG tablet     I provided  30 minutes of  face-to-face time during this encounter reviewing patient's current problems and past surgeries, labs and imaging studies, providing counseling on the above mentioned problems , and coordination  of care . Follow-up: Return in about 3 months (around 12/30/2020).   Crecencio Mc, MD

## 2020-09-30 DIAGNOSIS — M5412 Radiculopathy, cervical region: Secondary | ICD-10-CM | POA: Insufficient documentation

## 2020-09-30 NOTE — Assessment & Plan Note (Signed)
His wife manages his medications and accompanies him to visits.  Continue aricept.  Recommend tighter control of lipids and resuming of plavix

## 2020-09-30 NOTE — Assessment & Plan Note (Signed)
Well controlled on current regimen of telmisartan 40 mg daily,  cartia XT 120 mg daily  . Renal function stable, no changes today.

## 2020-09-30 NOTE — Assessment & Plan Note (Signed)
He remains appropriately active for is age and  Denies chest pain with activities.  I  recommend resuming plavix given his history of stent in 2007 and the presence of lacunar strokes on his recent brain MRI.  He prefers to defer to his cardiologist.

## 2020-09-30 NOTE — Assessment & Plan Note (Signed)
He was prescribed sertraline  at 25 mg dose but did not start the medication and has no interest in taking it at this time.  His mood is significantly improved compared to last visit

## 2020-09-30 NOTE — Assessment & Plan Note (Signed)
His chronic pain has improved s/p ESI x 3 by [facility],  Last one in May 2021. Recommend adding 1000 mg tylenol q 12 hours for neck pain that is suggestive of cervical disk disease.

## 2020-09-30 NOTE — Assessment & Plan Note (Signed)
Recommend adding 1000 mg tylenol q 12 hours for episodic neck pain that is suggestive of cervical disk disease.

## 2020-09-30 NOTE — Assessment & Plan Note (Addendum)
LDL and triglycerides are not at  goal on 20 mg atorvastatin.  Recommended increasing dose to 40 mg daily . continuining daily asa  And resuming plavix.   Lab Results  Component Value Date   CHOL 154 07/09/2020   HDL 45.50 07/09/2020   LDLCALC 95 07/09/2020   TRIG 68.0 07/09/2020   CHOLHDL 3 07/09/2020   Lab Results  Component Value Date   ALT 11 07/09/2020   AST 16 07/09/2020   ALKPHOS 111 07/09/2020   BILITOT 0.7 07/09/2020

## 2020-09-30 NOTE — Assessment & Plan Note (Signed)
He is reminded to continue daily PPI

## 2020-09-30 NOTE — Assessment & Plan Note (Signed)
TSH is therapeutic,  Continue current levothyoxine dose,  Recheck in 6 montths   Lab Results  Component Value Date   TSH 2.65 07/09/2020

## 2020-10-13 DIAGNOSIS — Z9889 Other specified postprocedural states: Secondary | ICD-10-CM | POA: Diagnosis not present

## 2020-10-13 DIAGNOSIS — R0602 Shortness of breath: Secondary | ICD-10-CM | POA: Diagnosis not present

## 2020-10-13 DIAGNOSIS — I25708 Atherosclerosis of coronary artery bypass graft(s), unspecified, with other forms of angina pectoris: Secondary | ICD-10-CM | POA: Diagnosis not present

## 2020-10-13 DIAGNOSIS — J449 Chronic obstructive pulmonary disease, unspecified: Secondary | ICD-10-CM | POA: Diagnosis not present

## 2020-10-13 DIAGNOSIS — Z951 Presence of aortocoronary bypass graft: Secondary | ICD-10-CM | POA: Diagnosis not present

## 2020-10-13 DIAGNOSIS — I2510 Atherosclerotic heart disease of native coronary artery without angina pectoris: Secondary | ICD-10-CM | POA: Diagnosis not present

## 2020-10-13 DIAGNOSIS — E785 Hyperlipidemia, unspecified: Secondary | ICD-10-CM | POA: Diagnosis not present

## 2020-10-13 DIAGNOSIS — I471 Supraventricular tachycardia: Secondary | ICD-10-CM | POA: Diagnosis not present

## 2020-10-20 DIAGNOSIS — H348312 Tributary (branch) retinal vein occlusion, right eye, stable: Secondary | ICD-10-CM | POA: Diagnosis not present

## 2020-10-21 ENCOUNTER — Other Ambulatory Visit: Payer: Self-pay

## 2020-10-21 ENCOUNTER — Ambulatory Visit (INDEPENDENT_AMBULATORY_CARE_PROVIDER_SITE_OTHER): Payer: Medicare Other

## 2020-10-21 DIAGNOSIS — E538 Deficiency of other specified B group vitamins: Secondary | ICD-10-CM

## 2020-10-21 MED ORDER — CYANOCOBALAMIN 1000 MCG/ML IJ SOLN
1000.0000 ug | Freq: Once | INTRAMUSCULAR | Status: AC
  Administered 2020-10-21: 1000 ug via INTRAMUSCULAR

## 2020-10-21 NOTE — Progress Notes (Signed)
Patient presented for B 12 injection to left deltoid, patient voiced no concerns nor showed any signs of distress during injection. 

## 2020-10-26 ENCOUNTER — Other Ambulatory Visit: Payer: Self-pay | Admitting: Internal Medicine

## 2020-10-27 ENCOUNTER — Other Ambulatory Visit: Payer: Self-pay

## 2020-10-27 ENCOUNTER — Telehealth: Payer: Self-pay

## 2020-10-27 MED ORDER — DONEPEZIL HCL 10 MG PO TABS
10.0000 mg | ORAL_TABLET | Freq: Every day | ORAL | 5 refills | Status: DC
Start: 2020-10-27 — End: 2021-04-27

## 2020-10-27 NOTE — Telephone Encounter (Signed)
Pt's wife [name] called and states that pt is having bad chest and right arm pain. She states that she does not want to take him to the ED because she does not want to wait there for 8 hours. She states that he does not seem to be short of breath. Transferred to [facility] to triage

## 2020-10-27 NOTE — Telephone Encounter (Signed)
Spoke with pt and he stated that it is not his chest that hurts it is his right arm and under his right arm. Pt stated that he was doing some yard work on Friday and carrying bags of leaves and after that is when he started having the pain in his arm and under his arm. He stated that hurt is move his arm up and down but now it seems to be a constant discomfort. Pt stated that he does not have any SOBr, no chest pain, no N/V. Pt was advised to go to UC, no available appts in office today. Pt and pt's wife stated that they would go over to [facility] in this morning.

## 2020-11-01 ENCOUNTER — Other Ambulatory Visit: Payer: Self-pay | Admitting: Internal Medicine

## 2020-11-03 DIAGNOSIS — Z20822 Contact with and (suspected) exposure to covid-19: Secondary | ICD-10-CM | POA: Diagnosis not present

## 2020-11-03 DIAGNOSIS — M25521 Pain in right elbow: Secondary | ICD-10-CM | POA: Diagnosis not present

## 2020-11-03 DIAGNOSIS — Z951 Presence of aortocoronary bypass graft: Secondary | ICD-10-CM | POA: Diagnosis not present

## 2020-11-03 DIAGNOSIS — R059 Cough, unspecified: Secondary | ICD-10-CM | POA: Diagnosis not present

## 2020-11-03 DIAGNOSIS — Z03818 Encounter for observation for suspected exposure to other biological agents ruled out: Secondary | ICD-10-CM | POA: Diagnosis not present

## 2020-11-03 DIAGNOSIS — M19011 Primary osteoarthritis, right shoulder: Secondary | ICD-10-CM | POA: Diagnosis not present

## 2020-11-03 DIAGNOSIS — M25511 Pain in right shoulder: Secondary | ICD-10-CM | POA: Diagnosis not present

## 2020-11-03 DIAGNOSIS — R0600 Dyspnea, unspecified: Secondary | ICD-10-CM | POA: Diagnosis not present

## 2020-11-03 DIAGNOSIS — J069 Acute upper respiratory infection, unspecified: Secondary | ICD-10-CM | POA: Diagnosis not present

## 2020-11-03 DIAGNOSIS — M19021 Primary osteoarthritis, right elbow: Secondary | ICD-10-CM | POA: Diagnosis not present

## 2020-11-10 ENCOUNTER — Telehealth: Payer: Self-pay

## 2020-11-10 NOTE — Telephone Encounter (Signed)
Appointment has been scheduled with Dr Olivia Mackie [address] [facility]

## 2020-11-10 NOTE — Telephone Encounter (Signed)
Patient stated he is still having a cough and his elbow is still hurting. He was informed he had a bone spur. He was placed on ABX and has not helped with his cough and congestion. He was given mobic for his px. He is also still having px under his right arm going from midline to back. EKG was normal at [facility] per patient wife. Due to no appointment patients wife want to know what to do next. They may want a referral to orthopedic due to the bone spur. He also tested negative for COVID.

## 2020-11-10 NOTE — Telephone Encounter (Signed)
Can you triage

## 2020-11-10 NOTE — Telephone Encounter (Signed)
I cannot refer for something I have not seen .  Schedule appt.

## 2020-11-10 NOTE — Telephone Encounter (Signed)
Pt's wife came in and asked for an appt with Dr Derrel Nip this week. There are no appts avail at time of visit this week with any provider. She states that pt went to walk in clinic and they took xrays. They state that he has a viral infection and a cyst on right elbow. She states that he is not getting any better and would like a call back.

## 2020-11-12 ENCOUNTER — Ambulatory Visit (INDEPENDENT_AMBULATORY_CARE_PROVIDER_SITE_OTHER): Payer: Medicare Other

## 2020-11-12 VITALS — Ht 67.0 in | Wt 160.0 lb

## 2020-11-12 DIAGNOSIS — Z0000 Encounter for general adult medical examination without abnormal findings: Secondary | ICD-10-CM | POA: Diagnosis not present

## 2020-11-12 NOTE — Progress Notes (Signed)
Subjective:   John Burns is a 79 y.o. male who presents for Medicare Annual/Subsequent preventive examination.  Review of Systems    No ROS.  Medicare Wellness Virtual Visit.         Objective:    Today's Vitals   11/12/20 1009  Weight: 160 lb (72.6 kg)  Height: 5\' 7"  (1.702 m)   Body mass index is 25.06 kg/m.  Advanced Directives 11/12/2020 01/06/2020 11/12/2019 07/20/2018 07/19/2016 12/29/2015  Does Patient Have a Medical Advance Directive? No No No No Yes -  Type of Advance Directive - - - - MidwifeHealthcare Power of Attorney -  Does patient want to make changes to medical advance directive? - - No - Patient declined - - -  Copy of Healthcare Power of Attorney in Chart? - - - - No - copy requested -  Would patient like information on creating a medical advance directive? No - Patient declined No - Patient declined - No - Patient declined - Yes - Educational materials given    Current Medications (verified) Outpatient Encounter Medications as of 11/12/2020  Medication Sig  . aspirin 81 MG tablet Take 1 tablet (81 mg total) by mouth daily.  Marland Kitchen. atorvastatin (LIPITOR) 40 MG tablet Take 1 tablet (40 mg total) by mouth daily.  . Calcium Carb-Cholecalciferol (CALCIUM-VITAMIN D) 500-200 MG-UNIT tablet Take 1 tablet by mouth daily.  Marland Kitchen. diltiazem (CARDIZEM CD) 120 MG 24 hr capsule Take 1 capsule by mouth once daily  . donepezil (ARICEPT) 10 MG tablet Take 1 tablet (10 mg total) by mouth at bedtime. Taking 10mg  one time daily  . EUTHYROX 50 MCG tablet Take 1 tablet by mouth once daily  . HYDROcodone-acetaminophen (NORCO) 10-325 MG tablet Take 1 tablet by mouth every 6 (six) hours as needed. For severe back pain.  Maximum 3 daily  . metoprolol tartrate (LOPRESSOR) 50 MG tablet Take 1 tablet by mouth once daily  . nitroGLYCERIN (NITROSTAT) 0.4 MG SL tablet Place 1 tablet (0.4 mg total) under the tongue every 5 (five) minutes as needed for chest pain.  Marland Kitchen. omeprazole (PRILOSEC) 20 MG capsule  Take 1 capsule by mouth once daily  . sildenafil (VIAGRA) 100 MG tablet TAKE 1 TABLET BY MOUTH ONCE DAILY AS NEEDED FOR ERECTILE DYSFUNCTION  . telmisartan (MICARDIS) 40 MG tablet TAKE 1 TABLET BY MOUTH AT BEDTIME  . vitamin E 400 UNIT capsule Take 400 Units by mouth daily.   Facility-Administered Encounter Medications as of 11/12/2020  Medication  . cyanocobalamin ((VITAMIN B-12)) injection 1,000 mcg    Allergies (verified) Patient has no known allergies.   History: Past Medical History:  Diagnosis Date  . ASCVD (arteriosclerotic cardiovascular disease)   . Bladder neck obstruction   . CAD (coronary artery disease) 2007   s/p PCI to mid LAD  . COPD (chronic obstructive pulmonary disease) (HCC)   . Diverticulosis   . GERD (gastroesophageal reflux disease)   . Heart attack (HCC)   . History of hiatal hernia   . History of tobacco abuse   . HOH (hard of hearing)   . Hypercholesteremia   . Hypertension   . Shortness of breath dyspnea    on exertion  . Stenosis of coronary stent 2013   Past Surgical History:  Procedure Laterality Date  . ANGIOPLASTY / STENTING FEMORAL    . CARDIAC CATHETERIZATION  05/10/12   patent LAD stent w/ 50% in stent restenosis; 70% stenosis ostium of left circumflex; 75% stenosis mid left circumflex  .  CATARACT EXTRACTION W/PHACO Left 12/29/2015   Procedure: CATARACT EXTRACTION PHACO AND INTRAOCULAR LENS PLACEMENT (IOC);  Surgeon: Estill Cotta, MD;  Location: [facility] ORS;  Service: Ophthalmology;  Laterality: Left;  [place]: 01:53.8 AP%: 26.2 CDE: 49.38 Lot # [id number]  . CATARACT EXTRACTION W/PHACO Right 01/26/2016   Procedure: CATARACT EXTRACTION PHACO AND INTRAOCULAR LENS PLACEMENT (IOC);  Surgeon: Estill Cotta, MD;  Location: [facility] ORS;  Service: Ophthalmology;  Laterality: Right;  [place] 01:13 AP% 25.3 CDE 33.95 fluid pack lot # [id number]  . COLONOSCOPY  02/05/14  . CORONARY ARTERY BYPASS GRAFT  06/04/14   x2 [place]   2007  . ESOPHAGOGASTRODUODENOSCOPY  02/05/14   Family History  Problem Relation Age of Onset  . Heart disease Mother   . Cancer Father 43       prostate and colon cancer   Social History   Socioeconomic History  . Marital status: Married    Spouse name: Not on file  . Number of children: Not on file  . Years of education: Not on file  . Highest education level: Not on file  Occupational History  . Not on file  Tobacco Use  . Smoking status: Former Smoker    Packs/day: 1.00    Years: 50.00    Pack years: 50.00    Types: Cigarettes    Quit date: 11/22/1993    Years since quitting: 26.9  . Smokeless tobacco: Former Systems developer    Types: Chew  . Tobacco comment: used chew to help quit smoking X1 year.  Substance and Sexual Activity  . Alcohol use: No  . Drug use: No  . Sexual activity: Yes  Other Topics Concern  . Not on file  Social History Narrative  . Not on file   Social Determinants of Health   Financial Resource Strain: Low Risk   . Difficulty of Paying Living Expenses: Not hard at all  Food Insecurity: No Food Insecurity  . Worried About Charity fundraiser in the Last Year: Never true  . Ran Out of Food in the Last Year: Never true  Transportation Needs: No Transportation Needs  . Lack of Transportation (Medical): No  . Lack of Transportation (Non-Medical): No  Physical Activity: Not on file  Stress: No Stress Concern Present  . Feeling of Stress : Not at all  Social Connections: Unknown  . Frequency of Communication with Friends and Family: Not on file  . Frequency of Social Gatherings with Friends and Family: Not on file  . Attends Religious Services: Not on file  . Active Member of Clubs or Organizations: Not on file  . Attends Archivist Meetings: Not on file  . Marital Status: Married    Tobacco Counseling Counseling given: Not Answered Comment: used chew to help quit smoking X1 year.   Clinical Intake:  Pre-visit preparation completed:  Yes        Diabetes: No  How often do you need to have someone help you when you read instructions, pamphlets, or other written materials from your doctor or pharmacy?: 1 - Never   Interpreter Needed?: No      Activities of Daily Living No flowsheet data found.  Patient Care Team: Crecencio Mc, MD as PCP - General (Internal Medicine)  Indicate any recent Medical Services you may have received from other than [facility] providers in the past year (date may be approximate).     Assessment:   This is a routine wellness examination for  Hever.  I connected with [place] today by telephone and verified that I am speaking with the correct person using two identifiers. Location patient: home Location provider: work Persons participating in the virtual visit: patient, Marine scientist.    I discussed the limitations, risks, security and privacy concerns of performing an evaluation and management service by telephone and the availability of in person appointments. The patient expressed understanding and verbally consented to this telephonic visit.    Interactive audio and video telecommunications were attempted between this provider and patient, however failed, due to patient having technical difficulties OR patient did not have access to video capability.  We continued and completed visit with audio only.  Some vital signs may be absent or patient reported.   Hearing/Vision screen  Hearing Screening   125Hz  250Hz  500Hz  1000Hz  2000Hz  3000Hz  4000Hz  6000Hz  8000Hz   Right ear:           Left ear:           Comments: Patient is able to hear conversational tones without difficulty.  No issues reported.    Vision Screening Comments: Followed by [facility] Wears corrective lenses Visual acuity not assessed, virtual visit.  They have seen their ophthalmologist in the last 12 months.     Dietary issues and exercise activities discussed:    Regular diet Good  Goals      Patient  Stated   .  Follow up with Primary Care Provider (pt-stated)      As needed      Depression Screen PHQ 2/9 Scores 11/12/2020 09/29/2020 03/26/2020 11/12/2019 11/12/2019 07/20/2018 07/19/2017  PHQ - 2 Score - 3 1 0 0 0 0  PHQ- 9 Score - 9 8 - - - -  Exception Documentation (No Data) - - - - - -    Fall Risk Fall Risk  11/12/2020 09/29/2020 03/26/2020 01/17/2020 12/24/2019  Falls in the past year? 0 0 0 0 0  Number falls in past yr: 0 - - - -  Injury with Fall? 0 - - - -  Comment - - - - -  Follow up Falls evaluation completed Falls evaluation completed Falls evaluation completed Falls evaluation completed Falls evaluation completed    [place]: Handrails in use when climbing stairs? Yes Home free of loose throw rugs in walkways, pet beds, electrical cords, etc? Yes  Adequate lighting in your home to reduce risk of falls? Yes   ASSISTIVE DEVICES UTILIZED TO PREVENT FALLS: Use of a cane, walker or w/c? No   TIMED UP AND GO: Was the test performed? No . Virtual visit.   Cognitive Function: MMSE - Mini Mental State Exam 07/20/2018 07/19/2016  Orientation to time 5 5  Orientation to Place 5 5  Registration 3 3  Attention/ Calculation 5 5  Recall 3 2  Language- name 2 objects 2 2  Language- repeat 1 1  Language- follow 3 step command 3 3  Language- read & follow direction 1 1  Write a sentence 1 1  Copy design 1 1  Total score 30 29     6CIT Screen 11/12/2020 11/12/2019 07/19/2017  What Year? 0 points 0 points 0 points  What month? 0 points 0 points 0 points  What time? 0 points 0 points 0 points  Count back from 20 0 points 0 points 0 points  Months in reverse 0 points 2 points 0 points  Repeat phrase 10 points 4 points 0 points  Total  Score 10 6 0    Immunizations Immunization History  Administered Date(s) Administered  . Fluad Quad(high Dose 65+) 08/20/2019, 09/16/2020  . Influenza Split 09/26/2013  . Influenza, High Dose Seasonal PF  07/28/2016, 08/23/2017, 09/05/2018  . Influenza,inj,Quad PF,6+ Mos 08/28/2014, 09/30/2015  . Moderna Sars-Covid-2 Vaccination 12/05/2019, 01/02/2020, 10/22/2020  . Pneumococcal Conjugate-13 12/18/2014  . Pneumococcal Polysaccharide-23 11/26/2013  . Tdap 11/21/2009  . Zoster 11/21/2004    TDAP status: Due, Education has been provided regarding the importance of this vaccine. Advised may receive this vaccine at local pharmacy or Health Dept. Aware to provide a copy of the vaccination record if obtained from local pharmacy or Health Dept. Verbalized acceptance and understanding. Deferred.   Health Maintenance Health Maintenance  Topic Date Due  . TETANUS/TDAP  11/12/2021 (Originally 11/22/2019)  . INFLUENZA VACCINE  Completed  . COVID-19 Vaccine  Completed  . PNA vac Low Risk Adult  Completed  . COLONOSCOPY  Discontinued   Colorectal cancer screening: No longer required.   Lung Cancer Screening: (Low Dose CT Chest recommended if Age 92-80 years, 30 pack-year currently smoking OR have quit w/in 15years.) does not qualify.   Hepatitis C Screening: does not qualify.  Vision Screening: Recommended annual ophthalmology exams for early detection of glaucoma and other disorders of the eye. Is the patient up to date with their annual eye exam?  Yes  Who is the provider or what is the name of the office in which the patient attends annual eye exams? [facility]  Dental Screening: Recommended annual dental exams for proper oral hygiene. Dentures.   Community Resource Referral / Chronic Care Management: CRR required this visit?  No   CCM required this visit?  No      Plan:   Keep all routine maintenance appointments.   Follow up 11/13/20 @ 11:30  Nurse visit 11/20/20 @ 3:00, B12 injection  Next scheduled lab 12/26/20 @ 8:30  Follow up 12/30/20 @ 1:30  I have personally reviewed and noted the following in the patient's chart:   . Medical and social history . Use of alcohol,  tobacco or illicit drugs  . Current medications and supplements . Functional ability and status . Nutritional status . Physical activity . Advanced directives . List of other physicians . Hospitalizations, surgeries, and ER visits in previous 12 months . Vitals . Screenings to include cognitive, depression, and falls . Referrals and appointments  In addition, I have reviewed and discussed with patient certain preventive protocols, quality metrics, and best practice recommendations. A written personalized care plan for preventive services as well as general preventive health recommendations were provided to patient via mychart.     Varney Biles, LPN   [phone]

## 2020-11-12 NOTE — Patient Instructions (Addendum)
Mr. John Burns , Thank you for taking time to come for your Medicare Wellness Visit. I appreciate your ongoing commitment to your health goals. Please review the following plan we discussed and let me know if I can assist you in the future.   These are the goals we discussed: Goals      Patient Stated   .  Follow up with Primary Care Provider (pt-stated)      As needed       This is a list of the screening recommended for you and due dates:  Health Maintenance  Topic Date Due  . Tetanus Vaccine  11/12/2021*  . Flu Shot  Completed  . COVID-19 Vaccine  Completed  . Pneumonia vaccines  Completed  . Colon Cancer Screening  Discontinued  *Topic was postponed. The date shown is not the original due date.   Immunizations Immunization History  Administered Date(s) Administered  . Fluad Quad(high Dose 65+) 08/20/2019, 09/16/2020  . Influenza Split 09/26/2013  . Influenza, High Dose Seasonal PF 07/28/2016, 08/23/2017, 09/05/2018  . Influenza,inj,Quad PF,6+ Mos 08/28/2014, 09/30/2015  . Moderna Sars-Covid-2 Vaccination 12/05/2019, 01/02/2020  . Pneumococcal Conjugate-13 12/18/2014  . Pneumococcal Polysaccharide-23 11/26/2013  . Tdap 11/21/2009  . Zoster 11/21/2004   Keep all routine maintenance appointments.   Follow up 11/13/20 @ 11:30  Nurse visit 11/20/20 @ 3:00, B12 injection  Next scheduled lab 12/26/20 @ 8:30  Follow up 12/30/20 @ 1:30  Advanced directives: End of life planning; Advance aging; Advanced directives discussed.  Copy of current HCPOA/Living Will requested.    Conditions/risks identified: none new  Next appointment: Follow up in one year for your annual wellness visit.   Preventive Care 16 Years and Older, Male Preventive care refers to lifestyle choices and visits with your health care provider that can promote health and wellness. What does preventive care include?  A yearly physical exam. This is also called an annual well check.  Dental exams once or  twice a year.  Routine eye exams. Ask your health care provider how often you should have your eyes checked.  Personal lifestyle choices, including:  Daily care of your teeth and gums.  Regular physical activity.  Eating a healthy diet.  Avoiding tobacco and drug use.  Limiting alcohol use.  Practicing safe sex.  Taking low doses of aspirin every day.  Taking vitamin and mineral supplements as recommended by your health care provider. What happens during an annual well check? The services and screenings done by your health care provider during your annual well check will depend on your age, overall health, lifestyle risk factors, and family history of disease. Counseling  Your health care provider may ask you questions about your:  Alcohol use.  Tobacco use.  Drug use.  Emotional well-being.  Home and relationship well-being.  Sexual activity.  Eating habits.  History of falls.  Memory and ability to understand (cognition).  Work and work Statistician. Screening  You may have the following tests or measurements:  Height, weight, and BMI.  Blood pressure.  Lipid and cholesterol levels. These may be checked every 5 years, or more frequently if you are over 54 years old.  Skin check.  Lung cancer screening. You may have this screening every year starting at age 44 if you have a 30-pack-year history of smoking and currently smoke or have quit within the past 15 years.  Fecal occult blood test (FOBT) of the stool. You may have this test every year starting at age 65.  Flexible sigmoidoscopy or colonoscopy. You may have a sigmoidoscopy every 5 years or a colonoscopy every 10 years starting at age 20.  Prostate cancer screening. Recommendations will vary depending on your family history and other risks.  Hepatitis C blood test.  Hepatitis B blood test.  Sexually transmitted disease (STD) testing.  Diabetes screening. This is done by checking your blood  sugar (glucose) after you have not eaten for a while (fasting). You may have this done every 1-3 years.  Abdominal aortic aneurysm (AAA) screening. You may need this if you are a current or former smoker.  Osteoporosis. You may be screened starting at age 55 if you are at high risk. Talk with your health care provider about your test results, treatment options, and if necessary, the need for more tests. Vaccines  Your health care provider may recommend certain vaccines, such as:  Influenza vaccine. This is recommended every year.  Tetanus, diphtheria, and acellular pertussis (Tdap, Td) vaccine. You may need a Td booster every 10 years.  Zoster vaccine. You may need this after age 36.  Pneumococcal 13-valent conjugate (PCV13) vaccine. One dose is recommended after age 37.  Pneumococcal polysaccharide (PPSV23) vaccine. One dose is recommended after age 69. Talk to your health care provider about which screenings and vaccines you need and how often you need them. This information is not intended to replace advice given to you by your health care provider. Make sure you discuss any questions you have with your health care provider. Document Released: 12/05/2015 Document Revised: 07/28/2016 Document Reviewed: 09/09/2015 Elsevier Interactive Patient Education  2017 [place] Prevention in the Home Falls can cause injuries. They can happen to people of all ages. There are many things you can do to make your home safe and to help prevent falls. What can I do on the outside of my home?  Regularly fix the edges of walkways and driveways and fix any cracks.  Remove anything that might make you trip as you walk through a door, such as a raised step or threshold.  Trim any bushes or trees on the path to your home.  Use bright outdoor lighting.  Clear any walking paths of anything that might make someone trip, such as rocks or tools.  Regularly check to see if handrails are loose or  broken. Make sure that both sides of any steps have handrails.  Any raised decks and porches should have guardrails on the edges.  Have any leaves, snow, or ice cleared regularly.  Use sand or salt on walking paths during winter.  Clean up any spills in your garage right away. This includes oil or grease spills. What can I do in the bathroom?  Use night lights.  Install grab bars by the toilet and in the tub and shower. Do not use towel bars as grab bars.  Use non-skid mats or decals in the tub or shower.  If you need to sit down in the shower, use a plastic, non-slip stool.  Keep the floor dry. Clean up any water that spills on the floor as soon as it happens.  Remove soap buildup in the tub or shower regularly.  Attach bath mats securely with double-sided non-slip rug tape.  Do not have throw rugs and other things on the floor that can make you trip. What can I do in the bedroom?  Use night lights.  Make sure that you have a light by your bed that is easy to reach.  Do  not use any sheets or blankets that are too big for your bed. They should not hang down onto the floor.  Have a firm chair that has side arms. You can use this for support while you get dressed.  Do not have throw rugs and other things on the floor that can make you trip. What can I do in the kitchen?  Clean up any spills right away.  Avoid walking on wet floors.  Keep items that you use a lot in easy-to-reach places.  If you need to reach something above you, use a strong step stool that has a grab bar.  Keep electrical cords out of the way.  Do not use floor polish or wax that makes floors slippery. If you must use wax, use non-skid floor wax.  Do not have throw rugs and other things on the floor that can make you trip. What can I do with my stairs?  Do not leave any items on the stairs.  Make sure that there are handrails on both sides of the stairs and use them. Fix handrails that are  broken or loose. Make sure that handrails are as long as the stairways.  Check any carpeting to make sure that it is firmly attached to the stairs. Fix any carpet that is loose or worn.  Avoid having throw rugs at the top or bottom of the stairs. If you do have throw rugs, attach them to the floor with carpet tape.  Make sure that you have a light switch at the top of the stairs and the bottom of the stairs. If you do not have them, ask someone to add them for you. What else can I do to help prevent falls?  Wear shoes that:  Do not have high heels.  Have rubber bottoms.  Are comfortable and fit you well.  Are closed at the toe. Do not wear sandals.  If you use a stepladder:  Make sure that it is fully opened. Do not climb a closed stepladder.  Make sure that both sides of the stepladder are locked into place.  Ask someone to hold it for you, if possible.  Clearly mark and make sure that you can see:  Any grab bars or handrails.  First and last steps.  Where the edge of each step is.  Use tools that help you move around (mobility aids) if they are needed. These include:  Canes.  Walkers.  Scooters.  Crutches.  Turn on the lights when you go into a dark area. Replace any light bulbs as soon as they burn out.  Set up your furniture so you have a clear path. Avoid moving your furniture around.  If any of your floors are uneven, fix them.  If there are any pets around you, be aware of where they are.  Review your medicines with your doctor. Some medicines can make you feel dizzy. This can increase your chance of falling. Ask your doctor what other things that you can do to help prevent falls. This information is not intended to replace advice given to you by your health care provider. Make sure you discuss any questions you have with your health care provider. Document Released: 09/04/2009 Document Revised: 04/15/2016 Document Reviewed: 12/13/2014 Elsevier  Interactive Patient Education  2017 [employer].

## 2020-11-13 ENCOUNTER — Encounter: Payer: Self-pay | Admitting: Internal Medicine

## 2020-11-13 ENCOUNTER — Other Ambulatory Visit: Payer: Self-pay

## 2020-11-13 ENCOUNTER — Telehealth (INDEPENDENT_AMBULATORY_CARE_PROVIDER_SITE_OTHER): Payer: Medicare Other | Admitting: Internal Medicine

## 2020-11-13 VITALS — Ht 67.0 in | Wt 162.0 lb

## 2020-11-13 DIAGNOSIS — M19021 Primary osteoarthritis, right elbow: Secondary | ICD-10-CM

## 2020-11-13 DIAGNOSIS — M19011 Primary osteoarthritis, right shoulder: Secondary | ICD-10-CM | POA: Diagnosis not present

## 2020-11-13 DIAGNOSIS — R059 Cough, unspecified: Secondary | ICD-10-CM | POA: Diagnosis not present

## 2020-11-13 NOTE — Progress Notes (Signed)
Telephone Note  I connected with John Burns  on 11/13/20 at 12:10 PM EST by telephone  and verified that I am speaking with the correct person using two identifiers.  Location patient: home, [place] Location provider:work or home office Persons participating in the virtual visit: patient, provider pts wife   I discussed the limitations of evaluation and management by telemedicine and the availability of in person appointments. The patient expressed understanding and agreed to proceed.   HPI: 11/03/20 [facility] urgent care for cough given cefdinir 300 mg bid tessalon perles feeling better still coughing cough improved for the last 2-3 days some wheezing no sob he was coughing yellow phelgm but better per he and his wife . He still has a runny nose but no sinus pressure   Right elbow/shoulder pain dx arthritis and spurring right elbow declines to see ortho for now on mobic 7.5 mg qd prn  Declines to see ortho for now   ROS: See pertinent positives and negatives per HPI.  Past Medical History:  Diagnosis Date  . ASCVD (arteriosclerotic cardiovascular disease)   . Bladder neck obstruction   . CAD (coronary artery disease) 2007   s/p PCI to mid LAD  . COPD (chronic obstructive pulmonary disease) ([place])   . Diverticulosis   . GERD (gastroesophageal reflux disease)   . Heart attack ([place])   . History of hiatal hernia   . History of tobacco abuse   . HOH (hard of hearing)   . Hypercholesteremia   . Hypertension   . Shortness of breath dyspnea    on exertion  . Stenosis of coronary stent 2013    Past Surgical History:  Procedure Laterality Date  . ANGIOPLASTY / STENTING FEMORAL    . CARDIAC CATHETERIZATION  05/10/12   patent LAD stent w/ 50% in stent restenosis; 70% stenosis ostium of left circumflex; 75% stenosis mid left circumflex  . CATARACT EXTRACTION W/PHACO Left 12/29/2015   Procedure: CATARACT EXTRACTION PHACO AND INTRAOCULAR LENS PLACEMENT (IOC);  Surgeon: Estill Cotta, MD;   Location: [facility] ORS;  Service: Ophthalmology;  Laterality: Left;  [place]: 01:53.8 AP%: 26.2 CDE: 49.38 Lot # [id number]  . CATARACT EXTRACTION W/PHACO Right 01/26/2016   Procedure: CATARACT EXTRACTION PHACO AND INTRAOCULAR LENS PLACEMENT (IOC);  Surgeon: Estill Cotta, MD;  Location: [facility] ORS;  Service: Ophthalmology;  Laterality: Right;  [place] 01:13 AP% 25.3 CDE 33.95 fluid pack lot # [id number]  . COLONOSCOPY  02/05/14  . CORONARY ARTERY BYPASS GRAFT  06/04/14   x2 Reed  2007  . ESOPHAGOGASTRODUODENOSCOPY  02/05/14     Current Outpatient Medications:  .  aspirin 81 MG tablet, Take 1 tablet (81 mg total) by mouth daily., Disp: 90 tablet, Rfl: 1 .  atorvastatin (LIPITOR) 40 MG tablet, Take 1 tablet (40 mg total) by mouth daily., Disp: 90 tablet, Rfl: 1 .  benzonatate (TESSALON) 200 MG capsule, Take by mouth., Disp: , Rfl:  .  Calcium Carb-Cholecalciferol (CALCIUM-VITAMIN D) 500-200 MG-UNIT tablet, Take 1 tablet by mouth daily., Disp: , Rfl:  .  diltiazem (CARDIZEM CD) 120 MG 24 hr capsule, Take 1 capsule by mouth once daily, Disp: 90 capsule, Rfl: 0 .  donepezil (ARICEPT) 10 MG tablet, Take 1 tablet (10 mg total) by mouth at bedtime. Taking 10mg  one time daily, Disp: 30 tablet, Rfl: 5 .  EUTHYROX 50 MCG tablet, Take 1 tablet by mouth once daily, Disp: 90 tablet, Rfl: 0 .  meloxicam (MOBIC) 7.5 MG tablet, Take  by mouth., Disp: , Rfl:  .  metoprolol tartrate (LOPRESSOR) 50 MG tablet, Take 1 tablet by mouth once daily, Disp: 90 tablet, Rfl: 0 .  omeprazole (PRILOSEC) 20 MG capsule, Take 1 capsule by mouth once daily, Disp: 90 capsule, Rfl: 0 .  telmisartan (MICARDIS) 40 MG tablet, TAKE 1 TABLET BY MOUTH AT BEDTIME, Disp: 90 tablet, Rfl: 0 .  vitamin E 400 UNIT capsule, Take 400 Units by mouth daily., Disp: , Rfl:  .  nitroGLYCERIN (NITROSTAT) 0.4 MG SL tablet, Place 1 tablet (0.4 mg total) under the tongue every 5 (five) minutes as needed for chest pain. (Patient  not taking: Reported on 11/13/2020), Disp: 50 tablet, Rfl: 3  Current Facility-Administered Medications:  .  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 Name Completed cefdinir 300 mg bid  Has tessalon perles  Consider CXR if not improving but per pt improved   -we discussed possible serious and likely etiologies, options for evaluation and workup, limitations of telemedicine visit vs in person visit, treatment, treatment risks and precautions. P  I discussed the assessment and treatment plan with the patient. The patient was provided an opportunity to ask questions and all were answered. The patient agreed with the plan and demonstrated an understanding of the instructions.    Time spent 10 min Delorise Jackson, MD

## 2020-11-13 NOTE — Progress Notes (Signed)
Patient was seen by urgent care and was tested negative for Covid and given an antibiotic Cefdinir 300 mg 14 tablets BID.   Patient presenting with cough and congestion.  Patient also having elbow pain with diagnosis of a bone spur.

## 2020-11-17 ENCOUNTER — Other Ambulatory Visit: Payer: Self-pay | Admitting: Internal Medicine

## 2020-11-18 ENCOUNTER — Telehealth: Payer: Self-pay | Admitting: Internal Medicine

## 2020-11-18 NOTE — Telephone Encounter (Signed)
Pt wife would like a call back about his medication and when to take them   Pt is still having head cold symptoms his wife would like a call back on why I cancelled his 12/30 B12 appt due to him still having symptoms. She said that Dr. Darrick Huntsman would allow him to come in because he is not running a fever

## 2020-11-20 ENCOUNTER — Ambulatory Visit: Payer: Medicare Other

## 2020-11-20 NOTE — Telephone Encounter (Signed)
Spoke with pt's wife and went over pt's medications and when he should be taking them. Wife wrote everything down and gave a verbal understanding. Wife stated that pt is due for his b12 injection but had to cancel his appt because he was having cold symptoms. She stated that pt is only having hoarseness now and wanted to know when he could come back in the office to get the b12 injection.

## 2020-11-28 ENCOUNTER — Ambulatory Visit (INDEPENDENT_AMBULATORY_CARE_PROVIDER_SITE_OTHER): Payer: Medicare Other

## 2020-11-28 ENCOUNTER — Other Ambulatory Visit: Payer: Self-pay

## 2020-11-28 DIAGNOSIS — E538 Deficiency of other specified B group vitamins: Secondary | ICD-10-CM

## 2020-11-28 MED ORDER — CYANOCOBALAMIN 1000 MCG/ML IJ SOLN
1000.0000 ug | Freq: Once | INTRAMUSCULAR | Status: AC
  Administered 2020-11-28: 1000 ug via INTRAMUSCULAR

## 2020-11-28 NOTE — Progress Notes (Signed)
Pt came in for b12 inj. Administered in L deltoid. No concerns at this time.

## 2020-12-26 ENCOUNTER — Other Ambulatory Visit: Payer: Self-pay

## 2020-12-26 ENCOUNTER — Other Ambulatory Visit (INDEPENDENT_AMBULATORY_CARE_PROVIDER_SITE_OTHER): Payer: Medicare Other

## 2020-12-26 DIAGNOSIS — I2510 Atherosclerotic heart disease of native coronary artery without angina pectoris: Secondary | ICD-10-CM

## 2020-12-26 DIAGNOSIS — I10 Essential (primary) hypertension: Secondary | ICD-10-CM | POA: Diagnosis not present

## 2020-12-26 LAB — COMPREHENSIVE METABOLIC PANEL
ALT: 11 U/L (ref 0–53)
AST: 16 U/L (ref 0–37)
Albumin: 4 g/dL (ref 3.5–5.2)
Alkaline Phosphatase: 106 U/L (ref 39–117)
BUN: 15 mg/dL (ref 6–23)
CO2: 28 mEq/L (ref 19–32)
Calcium: 9.9 mg/dL (ref 8.4–10.5)
Chloride: 106 mEq/L (ref 96–112)
Creatinine, Ser: 1 mg/dL (ref 0.40–1.50)
GFR: 71.27 mL/min (ref >60.00)
Glucose, Bld: 85 mg/dL (ref 70–99)
Potassium: 4.6 mEq/L (ref 3.5–5.1)
Sodium: 140 mEq/L (ref 135–145)
Total Bilirubin: 0.7 mg/dL (ref 0.2–1.2)
Total Protein: 6.4 g/dL (ref 6.0–8.3)

## 2020-12-26 LAB — LIPID PANEL
Cholesterol: 137 mg/dL (ref 0–200)
HDL: 48.2 mg/dL (ref >39.00)
LDL Cholesterol: 72 mg/dL (ref 0–99)
NonHDL: 88.99
Total CHOL/HDL Ratio: 3
Triglycerides: 86 mg/dL (ref 0.0–149.0)
VLDL: 17.2 mg/dL (ref 0.0–40.0)

## 2020-12-28 ENCOUNTER — Telehealth: Payer: Self-pay | Admitting: Internal Medicine

## 2020-12-30 ENCOUNTER — Ambulatory Visit (INDEPENDENT_AMBULATORY_CARE_PROVIDER_SITE_OTHER): Payer: Medicare Other | Admitting: Internal Medicine

## 2020-12-30 ENCOUNTER — Encounter: Payer: Self-pay | Admitting: Internal Medicine

## 2020-12-30 ENCOUNTER — Other Ambulatory Visit: Payer: Self-pay

## 2020-12-30 VITALS — BP 140/68 | HR 56 | Temp 97.6°F | Ht 67.01 in | Wt 162.6 lb

## 2020-12-30 DIAGNOSIS — E039 Hypothyroidism, unspecified: Secondary | ICD-10-CM | POA: Diagnosis not present

## 2020-12-30 DIAGNOSIS — I2510 Atherosclerotic heart disease of native coronary artery without angina pectoris: Secondary | ICD-10-CM | POA: Diagnosis not present

## 2020-12-30 DIAGNOSIS — M19021 Primary osteoarthritis, right elbow: Secondary | ICD-10-CM

## 2020-12-30 DIAGNOSIS — E538 Deficiency of other specified B group vitamins: Secondary | ICD-10-CM | POA: Diagnosis not present

## 2020-12-30 DIAGNOSIS — I25810 Atherosclerosis of coronary artery bypass graft(s) without angina pectoris: Secondary | ICD-10-CM | POA: Diagnosis not present

## 2020-12-30 DIAGNOSIS — R001 Bradycardia, unspecified: Secondary | ICD-10-CM

## 2020-12-30 DIAGNOSIS — F0150 Vascular dementia without behavioral disturbance: Secondary | ICD-10-CM

## 2020-12-30 DIAGNOSIS — I10 Essential (primary) hypertension: Secondary | ICD-10-CM | POA: Diagnosis not present

## 2020-12-30 MED ORDER — ZOSTER VAC RECOMB ADJUVANTED 50 MCG/0.5ML IM SUSR: 0.5000 mL | Freq: Once | INTRAMUSCULAR | 1 refills | Status: AC

## 2020-12-30 MED ORDER — TETANUS-DIPHTH-ACELL PERTUSSIS 5-2.5-18.5 LF-MCG/0.5 IM SUSY: 0.5000 mL | PREFILLED_SYRINGE | Freq: Once | INTRAMUSCULAR | 0 refills | Status: AC

## 2020-12-30 NOTE — Assessment & Plan Note (Addendum)
With bone spurring noted on x rays.  He is not experiencing persistent pain and is not using meloxicam daily

## 2020-12-30 NOTE — Progress Notes (Signed)
Subjective:  Patient ID: John Burns, male    DOB: [birth year]  Age: 80 y.o. MRN: [id number]  CC: The primary encounter diagnosis was Hypothyroidism (acquired). Diagnoses of Essential hypertension, B12 deficiency, Vascular dementia without behavioral disturbance ([place]), Arthritis of right elbow, Coronary artery disease involving coronary bypass graft of native heart without angina pectoris, and Sinus bradycardia on ECG were also pertinent to this visit.  HPI John Burns presents for follow up  This visit occurred during the SARS-CoV-2 public health emergency.  Safety protocols were in place, including screening questions prior to the visit, additional usage of staff PPE, and extensive cleaning of exam room while observing appropriate contact time as indicated for disinfecting solutions.    He feels fairly good,  He is sleeping well,  Has a good appetite and has no urinary issues or constipation. The Cough he was treated for last month with antibiotics has resolved His left Elbow pain is not constant and is not problematic   His wife continues to report irritability but reports that it is chronic.   Outpatient Medications Prior to Visit  Medication Sig Dispense Refill  . aspirin 81 MG tablet Take 1 tablet (81 mg total) by mouth daily. 90 tablet 1  . atorvastatin (LIPITOR) 40 MG tablet Take 1 tablet (40 mg total) by mouth daily. 90 tablet 1  . benzonatate (TESSALON) 200 MG capsule Take by mouth.    . Calcium Carb-Cholecalciferol (CALCIUM-VITAMIN D) 500-200 MG-UNIT tablet Take 1 tablet by mouth daily.    Marland Kitchen diltiazem (CARDIZEM CD) 120 MG 24 hr capsule Take 1 capsule by mouth once daily 90 capsule 0  . donepezil (ARICEPT) 10 MG tablet Take 1 tablet (10 mg total) by mouth at bedtime. Taking 10mg  one time daily 30 tablet 5  . EUTHYROX 50 MCG tablet Take 1 tablet by mouth once daily 90 tablet 0  . metoprolol tartrate (LOPRESSOR) 50 MG tablet Take 1 tablet by mouth once daily 90 tablet 0  .  nitroGLYCERIN (NITROSTAT) 0.4 MG SL tablet Place 1 tablet (0.4 mg total) under the tongue every 5 (five) minutes as needed for chest pain. 50 tablet 3  . omeprazole (PRILOSEC) 20 MG capsule Take 1 capsule by mouth once daily 90 capsule 0  . telmisartan (MICARDIS) 40 MG tablet TAKE 1 TABLET BY MOUTH AT BEDTIME 90 tablet 0  . vitamin E 400 UNIT capsule Take 400 Units by mouth daily.    . meloxicam (MOBIC) 7.5 MG tablet Take by mouth. (Patient not taking: Reported on 12/30/2020)    . cyanocobalamin ((VITAMIN B-12)) injection 1,000 mcg      No facility-administered medications prior to visit.    Review of Systems;  Patient denies headache, fevers, malaise, unintentional weight loss, skin rash, eye pain, sinus congestion and sinus pain, sore throat, dysphagia,  hemoptysis , cough, dyspnea, wheezing, chest pain, palpitations, orthopnea, edema, abdominal pain, nausea, melena, diarrhea, constipation, flank pain, dysuria, hematuria, urinary  Frequency, nocturia, numbness, tingling, seizures,  Focal weakness, Loss of consciousness,  Tremor, insomnia, depression, anxiety, and suicidal ideation.      Objective:  BP 140/68 (BP Location: Left Arm, Patient Position: Sitting)   Pulse (!) 56   Temp 97.6 F (36.4 C)   Ht 5' 7.01" (1.702 m)   Wt 162 lb 9.6 oz (73.8 kg)   SpO2 98%   BMI 25.46 kg/m   BP Readings from Last 3 Encounters:  12/30/20 140/68  09/29/20 (!) 120/56  03/26/20 (!) 148/66  Wt Readings from Last 3 Encounters:  12/30/20 162 lb 9.6 oz (73.8 kg)  11/13/20 162 lb (73.5 kg)  11/12/20 160 lb (72.6 kg)    General appearance: alert, cooperative and appears stated age Ears: normal TM's and external ear canals both ears Throat: lips, mucosa, and tongue normal; teeth and gums normal Neck: no adenopathy, no carotid bruit, supple, symmetrical, trachea midline and thyroid not enlarged, symmetric, no tenderness/mass/nodules Back: symmetric, no curvature. ROM normal. No CVA  tenderness. Lungs: clear to auscultation bilaterally Heart: regular rate and rhythm, S1, S2 normal, no murmur, click, rub or gallop Abdomen: soft, non-tender; bowel sounds normal; no masses,  no organomegaly Pulses: 2+ and symmetric Skin: Skin color, texture, turgor normal. No rashes or lesions Lymph nodes: Cervical, supraclavicular, and axillary nodes normal.  Lab Results  Component Value Date   HGBA1C 5.8 07/09/2020    Lab Results  Component Value Date   CREATININE 1.00 12/26/2020   CREATININE 1.24 07/09/2020   CREATININE 1.02 01/06/2020    Lab Results  Component Value Date   WBC 9.4 01/06/2020   HGB 14.6 01/06/2020   HCT 43.2 01/06/2020   PLT 170 01/06/2020   GLUCOSE 85 12/26/2020   CHOL 137 12/26/2020   TRIG 86.0 12/26/2020   HDL 48.20 12/26/2020   LDLCALC 72 12/26/2020   ALT 11 12/26/2020   AST 16 12/26/2020   NA 140 12/26/2020   K 4.6 12/26/2020   CL 106 12/26/2020   CREATININE 1.00 12/26/2020   BUN 15 12/26/2020   CO2 28 12/26/2020   TSH 2.65 07/09/2020   PSA 2.33 05/22/2015   HGBA1C 5.8 07/09/2020    MR LUMBAR SPINE WO CONTRAST  Result Date: 04/19/2020 CLINICAL DATA:  Chronic progressive left-sided low back pain and left leg pain. EXAM: MRI LUMBAR SPINE WITHOUT CONTRAST TECHNIQUE: Multiplanar, multisequence MR imaging of the lumbar spine was performed. No intravenous contrast was administered. COMPARISON:  CT scan of the lumbar spine dated 01/06/2020 FINDINGS: Segmentation:  5 lumbar type vertebra. Alignment:  Physiologic. Vertebrae:  No fracture, evidence of discitis, or bone lesion. Conus medullaris and cauda equina: Conus extends to the L1-2 level. Small lipoma of the filum terminalis. Paraspinal and other soft tissues: Sigmoid diverticulosis. Benign left parapelvic renal cysts. Otherwise negative. Disc levels: T12-L1: Disc desiccation.  Otherwise normal. L1-2: Disc desiccation. Small broad-based disc bulge with a tiny protrusion into the inferior aspect of  the left neural foramen without neural impingement. Otherwise normal. L2-3: Disc desiccation with degenerative changes of the vertebral endplates. Soft disc extrusion extending into the superior aspect of the left neural foramen which should affect the left L2 nerve. Small adjacent soft disc protrusion which extends across the midline to the right. Slight symmetrical compression of the thecal sac without spinal stenosis. L3-4: Disc desiccation. Tiny central disc bulge with an annular fissure. No neural impingement. L4-5: Small broad-based disc bulge with a central annular fissure with small protrusion extending into the right lateral recess and right neural foramen. Symmetrical slight compression of the thecal sac without focal neural impingement. No foraminal stenosis. L5-S1: Chronic disc space narrowing. Broad-based disc osteophyte complex extending into the inferior aspects of both neural foramina with moderate bilateral foraminal stenosis. The right L5 nerve appears to exit without impingement. The left L5 nerve appears slightly compressed under the lateral aspect of the pedicle on image 14 of series 5. Slight hypertrophy of the ligamentum flavum without neural impingement. IMPRESSION: 1. Soft disc extrusion at L2-3 extending into the superior aspect of  the left neural foramen which should affect the left L2 nerve. 2. Moderate bilateral foraminal stenosis at L5-S1 which could affect either or both L5 nerves. Electronically Signed   By: Lorriane Shire M.D.   On: 04/19/2020 16:30    Assessment & Plan:   Problem List Items Addressed This Visit      Unprioritized   Arthritis of right elbow    With bone spurring noted on x rays.  He is not experiencing persistent pain and is not using meloxicam daily          B12 deficiency   CAD (coronary artery disease) of artery bypass graft    He is asymptomatic and taking the appropriate medications.  LDL is at goal.  Lab Results  Component Value Date   CHOL  137 12/26/2020   HDL 48.20 12/26/2020   LDLCALC 72 12/26/2020   TRIG 86.0 12/26/2020   CHOLHDL 3 12/26/2020         Essential hypertension    Well controlled on current regimen of telmisartan, metoprolol . Renal function stable, no changes today.      Relevant Orders   Comprehensive metabolic panel   Hypothyroidism (acquired) - Primary   Relevant Orders   TSH   Sinus bradycardia on ECG    He continues to take metoprolol and diltiazem, prescribed by his cardiologist . He is not bothered by the bradycardia       Vascular dementia ([place])    His symptoms have not progressed , and he is tolerating aricept.          I have discontinued Baldomero L. Akter's meloxicam. I am also having him start on Tdap and Zoster Vaccine Adjuvanted. Additionally, I am having him maintain his vitamin E, aspirin, calcium-vitamin D, nitroGLYCERIN, omeprazole, atorvastatin, diltiazem, donepezil, Euthyrox, benzonatate, metoprolol tartrate, and telmisartan. We administered cyanocobalamin.  Meds ordered this encounter  Medications  . Tdap (BOOSTRIX) 5-2.5-18.5 LF-MCG/0.5 injection    Sig: Inject 0.5 mLs into the muscle once for 1 dose.    Dispense:  0.5 mL    Refill:  0  . Zoster Vaccine Adjuvanted [facility]) injection    Sig: Inject 0.5 mLs into the muscle once for 1 dose.    Dispense:  1 each    Refill:  1   A total of 40 minutes was spent with patient more than half of which was spent in counseling patient on the above mentioned issues , reviewing and explaining recent labs and imaging studies done, and coordination of care.   Medications Discontinued During This Encounter  Medication Reason  . meloxicam (MOBIC) 7.5 MG tablet Error    Follow-up: Return in about 6 months (around 06/29/2021).   Crecencio Mc, MD

## 2020-12-30 NOTE — Assessment & Plan Note (Signed)
He continues to take metoprolol and diltiazem, prescribed by his cardiologist . He is not bothered by the bradycardia

## 2020-12-30 NOTE — Assessment & Plan Note (Signed)
Well controlled on current regimen of telmisartan, metoprolol . Renal function stable, no changes today.

## 2020-12-30 NOTE — Patient Instructions (Addendum)
You are doing well!  Your elbow pain could recur if you spend too much time with your elbows on the table   You can resume meloxicam and tylenol if your elbow starts to pop again  You can add up to 2000 mg of acetominophen (tylenol) every day safely  In divided doses (500 mg every 6 hours  Or 1000 mg every 12 hours.)  I would try 15 minutes of ice pack every 4 to 6 hours     follow up in 6 months ,  Sooner if needed

## 2020-12-30 NOTE — Assessment & Plan Note (Addendum)
He is asymptomatic and taking the appropriate medications.  LDL is at goal.  Lab Results  Component Value Date   CHOL 137 12/26/2020   HDL 48.20 12/26/2020   LDLCALC 72 12/26/2020   TRIG 86.0 12/26/2020   CHOLHDL 3 12/26/2020

## 2020-12-30 NOTE — Assessment & Plan Note (Signed)
His symptoms have not progressed , and he is tolerating aricept   °

## 2021-01-15 ENCOUNTER — Other Ambulatory Visit: Payer: Self-pay | Admitting: Internal Medicine

## 2021-01-29 ENCOUNTER — Other Ambulatory Visit: Payer: Self-pay

## 2021-01-29 ENCOUNTER — Ambulatory Visit: Payer: Medicare Other

## 2021-01-29 ENCOUNTER — Ambulatory Visit (INDEPENDENT_AMBULATORY_CARE_PROVIDER_SITE_OTHER): Payer: Medicare Other

## 2021-01-29 DIAGNOSIS — E538 Deficiency of other specified B group vitamins: Secondary | ICD-10-CM

## 2021-01-29 MED ORDER — CYANOCOBALAMIN 1000 MCG/ML IJ SOLN
1000.0000 ug | Freq: Once | INTRAMUSCULAR | Status: AC
  Administered 2021-01-29: 1000 ug via INTRAMUSCULAR

## 2021-01-29 NOTE — Progress Notes (Signed)
John Burns presents today for injection per MD orders. B12 injection administered IM in right Upper Arm. Administration without incident. Patient tolerated well.  Celestine Bougie,cma

## 2021-02-07 ENCOUNTER — Other Ambulatory Visit: Payer: Self-pay | Admitting: Internal Medicine

## 2021-03-03 ENCOUNTER — Other Ambulatory Visit: Payer: Self-pay

## 2021-03-03 ENCOUNTER — Ambulatory Visit (INDEPENDENT_AMBULATORY_CARE_PROVIDER_SITE_OTHER): Payer: Medicare Other

## 2021-03-03 DIAGNOSIS — E538 Deficiency of other specified B group vitamins: Secondary | ICD-10-CM | POA: Diagnosis not present

## 2021-03-03 MED ORDER — CYANOCOBALAMIN 1000 MCG/ML IJ SOLN
1000.0000 ug | Freq: Once | INTRAMUSCULAR | Status: AC
  Administered 2021-03-03: 1000 ug via INTRAMUSCULAR

## 2021-03-03 NOTE — Progress Notes (Signed)
Patient presented for B 12 injection to left deltoid, patient voiced no concerns nor showed any signs of distress during injection. 

## 2021-04-02 IMAGING — MR MR LUMBAR SPINE W/O CM
5 of 8 series · 23 of 48 positions shown · non-contrast
Comparison: CT scan of the lumbar spine dated 01/06/2020

CLINICAL DATA: Chronic progressive left-sided low back pain and
left leg pain.

EXAM:
MRI LUMBAR SPINE WITHOUT CONTRAST
TECHNIQUE: Multiplanar, multisequence MR imaging of the lumbar spine was
performed. No intravenous contrast was administered.

[Series 5: T2 · sagittal · 4.0mm · 0.81mm/px · 3 of 17 slices shown (1 of 2)]
[im 1/17]
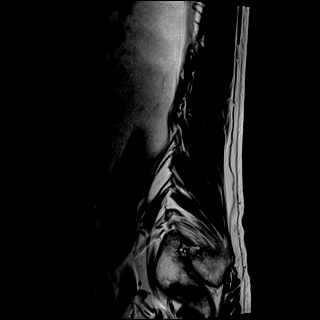
[im 9/17]
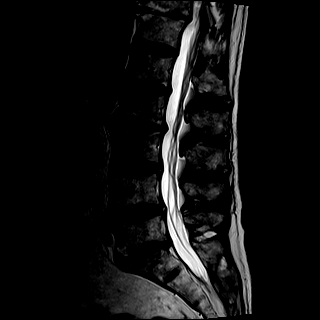
[im 17/17]
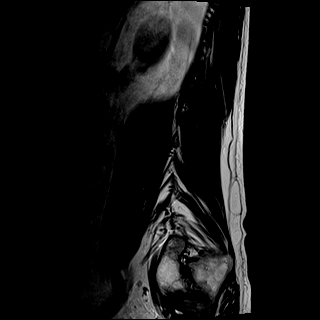

[Series 6: T1 · sagittal · 4.0mm · 0.81mm/px · 3 of 17 slices shown (1 of 2)]
[im 1/17]
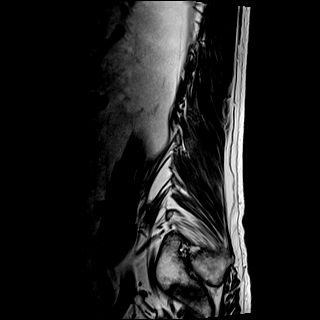
[im 9/17]
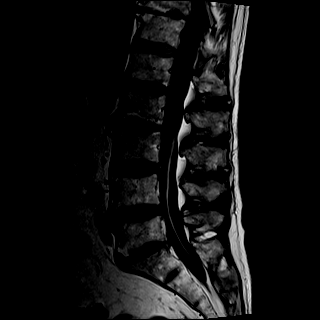
[im 17/17]
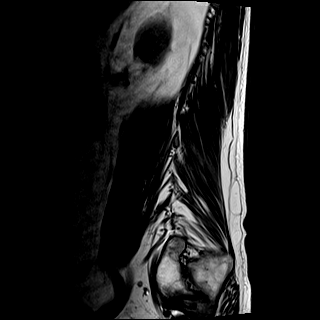

[Series 7: STIR · sagittal · 4.0mm · 0.41mm/px · 3 of 17 slices shown]
[im 1/17]
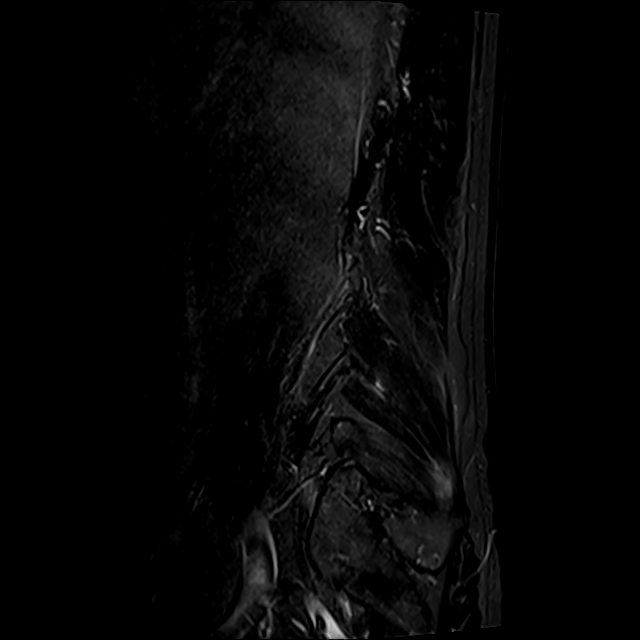
[im 9/17]
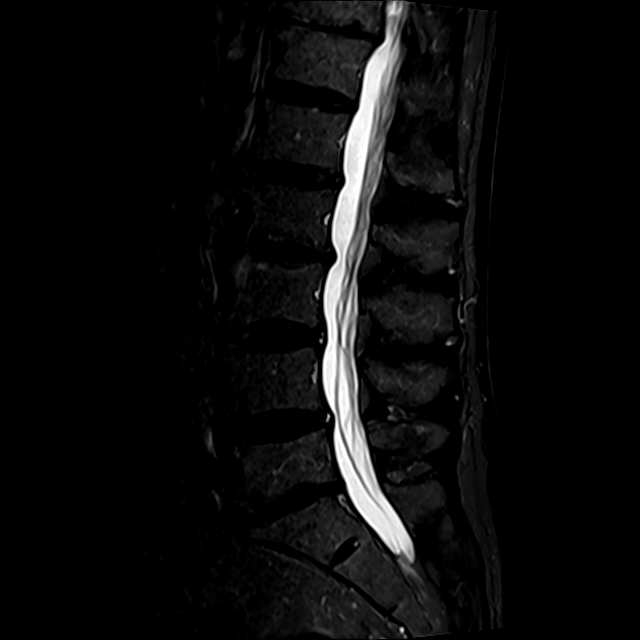
[im 17/17]
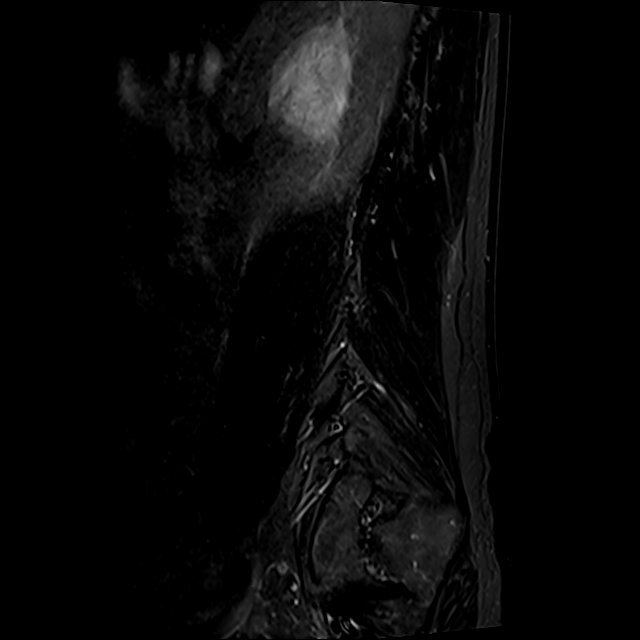

[Series 8: T2 · axial · 4.0mm · 0.78mm/px · z∈[-52,+144]mm · 7 of 35 slices shown (2 of 2)]
[im 1/35]
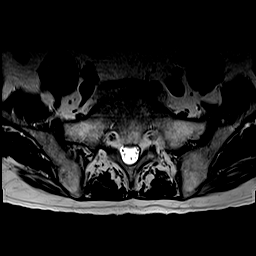
[im 6/35]
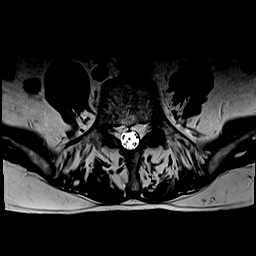
[im 12/35]
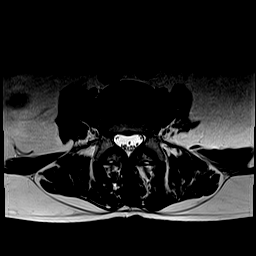
[im 18/35]
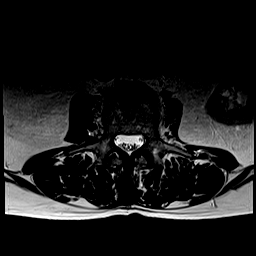
[im 23/35]
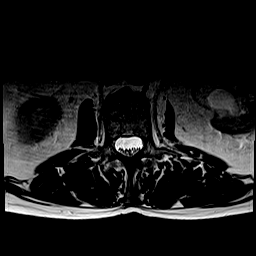
[im 29/35]
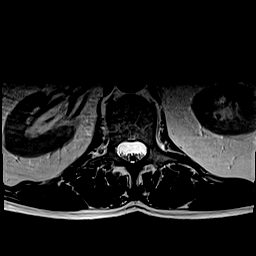
[im 35/35]
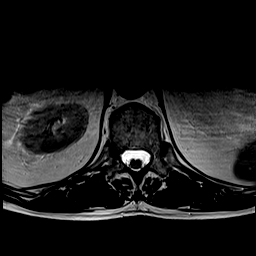

[Series 9: T1 · axial · 4.0mm · 0.39mm/px · z∈[-52,+144]mm · 7 of 35 slices shown (2 of 2)]
[im 1/35]
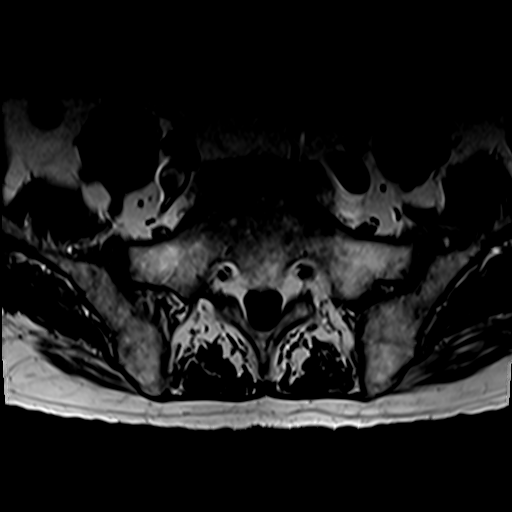
[im 6/35]
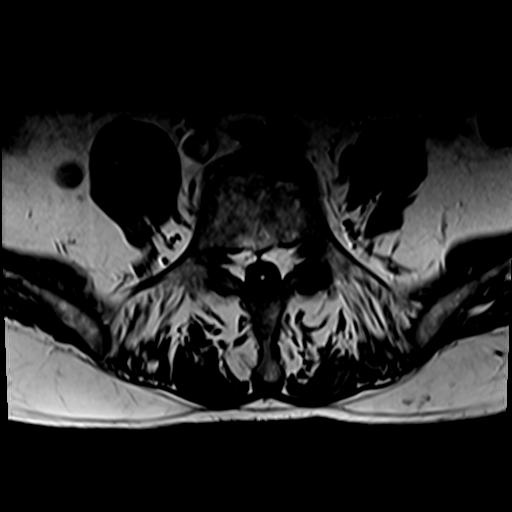
[im 12/35]
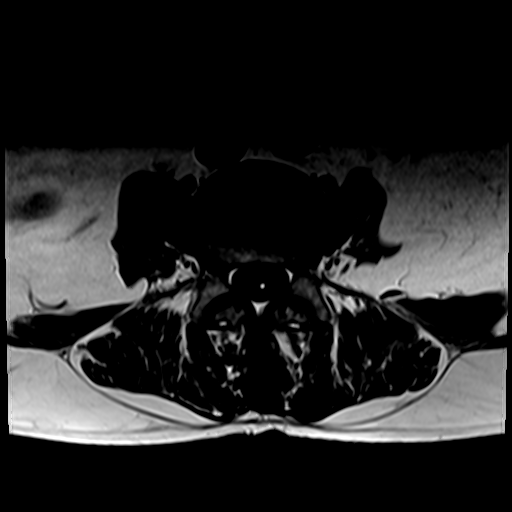
[im 18/35]
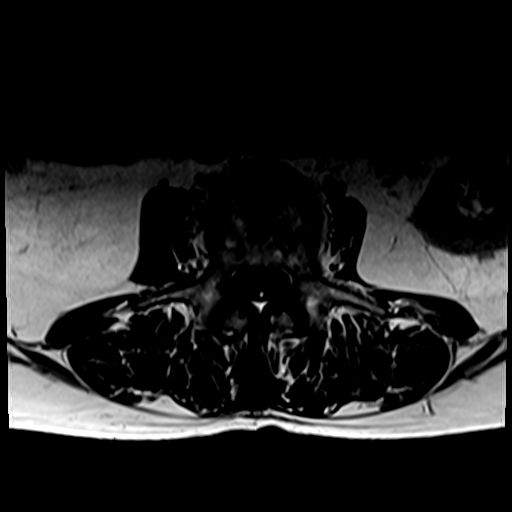
[im 23/35]
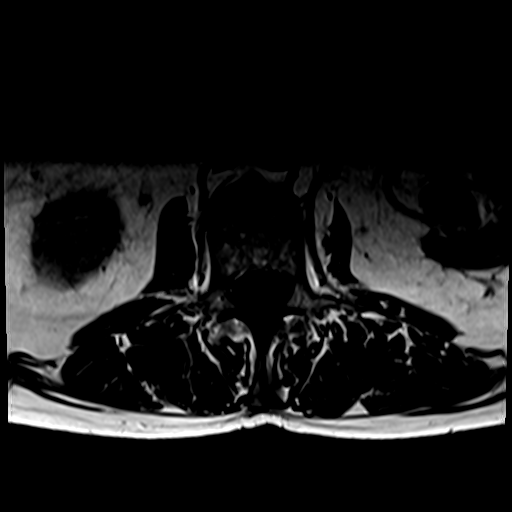
[im 29/35]
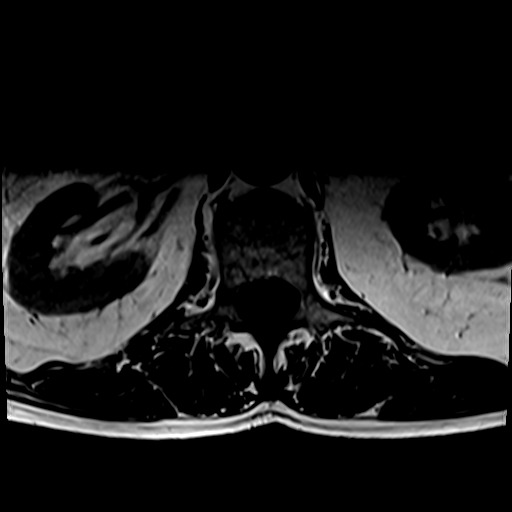
[im 35/35]
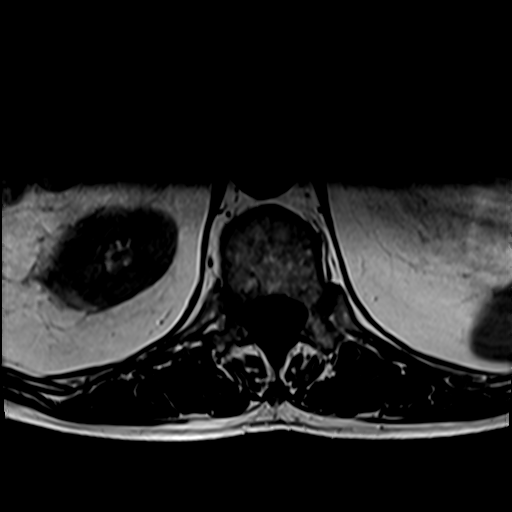

[23 of 48 positions shown; findings below may reference images not displayed]

FINDINGS: Segmentation:  5 lumbar type vertebra.

Alignment:  Physiologic.

Vertebrae:  No fracture, evidence of discitis, or bone lesion.

Conus medullaris and cauda equina: Conus extends to the L1-2 level.
Small lipoma of the filum terminalis.

Paraspinal and other soft tissues: Sigmoid diverticulosis. Benign
left parapelvic renal cysts. Otherwise negative.

Disc levels:

T12-L1: Disc desiccation.  Otherwise normal.

L1-2: Disc desiccation. Small broad-based disc bulge with a tiny
protrusion into the inferior aspect of the left neural foramen
without neural impingement. Otherwise normal.

L2-3: Disc desiccation with degenerative changes of the vertebral
endplates. Soft disc extrusion extending into the superior aspect of
the left neural foramen which should affect the left L2 nerve. Small
adjacent soft disc protrusion which extends across the midline to
the right. Slight symmetrical compression of the thecal sac without
spinal stenosis.

L3-4: Disc desiccation. Tiny central disc bulge with an annular
fissure. No neural impingement.

L4-5: Small broad-based disc bulge with a central annular fissure
with small protrusion extending into the right lateral recess and
right neural foramen. Symmetrical slight compression of the thecal
sac without focal neural impingement. No foraminal stenosis.

L5-S1: Chronic disc space narrowing. Broad-based disc osteophyte
complex extending into the inferior aspects of both neural foramina
with moderate bilateral foraminal stenosis. The right L5 nerve
appears to exit without impingement. The left L5 nerve appears
slightly compressed under the lateral aspect of the pedicle on image
14 of series 5. Slight hypertrophy of the ligamentum flavum without
neural impingement.
IMPRESSION: 1. Soft disc extrusion at L2-3 extending into the superior aspect of
the left neural foramen which should affect the left L2 nerve.
2. Moderate bilateral foraminal stenosis at L5-S1 which could affect
either or both L5 nerves.

## 2021-04-03 ENCOUNTER — Ambulatory Visit (INDEPENDENT_AMBULATORY_CARE_PROVIDER_SITE_OTHER): Payer: Medicare Other

## 2021-04-03 ENCOUNTER — Other Ambulatory Visit: Payer: Self-pay

## 2021-04-03 DIAGNOSIS — E538 Deficiency of other specified B group vitamins: Secondary | ICD-10-CM | POA: Diagnosis not present

## 2021-04-03 MED ORDER — CYANOCOBALAMIN 1000 MCG/ML IJ SOLN
1000.0000 ug | Freq: Once | INTRAMUSCULAR | Status: AC
  Administered 2021-04-03: 1000 ug via INTRAMUSCULAR

## 2021-04-03 NOTE — Progress Notes (Signed)
Patient presented for B 12 injection to left deltoid, patient voiced no concerns nor showed any signs of distress during injection. 

## 2021-04-06 ENCOUNTER — Other Ambulatory Visit: Payer: Self-pay | Admitting: Internal Medicine

## 2021-04-09 DIAGNOSIS — Z9889 Other specified postprocedural states: Secondary | ICD-10-CM | POA: Diagnosis not present

## 2021-04-09 DIAGNOSIS — I25708 Atherosclerosis of coronary artery bypass graft(s), unspecified, with other forms of angina pectoris: Secondary | ICD-10-CM | POA: Diagnosis not present

## 2021-04-09 DIAGNOSIS — I2510 Atherosclerotic heart disease of native coronary artery without angina pectoris: Secondary | ICD-10-CM | POA: Diagnosis not present

## 2021-04-09 DIAGNOSIS — R0602 Shortness of breath: Secondary | ICD-10-CM | POA: Diagnosis not present

## 2021-04-09 DIAGNOSIS — J449 Chronic obstructive pulmonary disease, unspecified: Secondary | ICD-10-CM | POA: Diagnosis not present

## 2021-04-09 DIAGNOSIS — E785 Hyperlipidemia, unspecified: Secondary | ICD-10-CM | POA: Diagnosis not present

## 2021-04-09 DIAGNOSIS — Z951 Presence of aortocoronary bypass graft: Secondary | ICD-10-CM | POA: Diagnosis not present

## 2021-04-09 DIAGNOSIS — I471 Supraventricular tachycardia: Secondary | ICD-10-CM | POA: Diagnosis not present

## 2021-04-09 DIAGNOSIS — Z87891 Personal history of nicotine dependence: Secondary | ICD-10-CM | POA: Diagnosis not present

## 2021-04-09 DIAGNOSIS — R001 Bradycardia, unspecified: Secondary | ICD-10-CM | POA: Diagnosis not present

## 2021-04-23 DIAGNOSIS — M5416 Radiculopathy, lumbar region: Secondary | ICD-10-CM | POA: Diagnosis not present

## 2021-04-23 DIAGNOSIS — M48062 Spinal stenosis, lumbar region with neurogenic claudication: Secondary | ICD-10-CM | POA: Diagnosis not present

## 2021-04-23 DIAGNOSIS — M5126 Other intervertebral disc displacement, lumbar region: Secondary | ICD-10-CM | POA: Diagnosis not present

## 2021-04-24 DIAGNOSIS — M5416 Radiculopathy, lumbar region: Secondary | ICD-10-CM | POA: Diagnosis not present

## 2021-04-24 DIAGNOSIS — M5126 Other intervertebral disc displacement, lumbar region: Secondary | ICD-10-CM | POA: Diagnosis not present

## 2021-04-25 ENCOUNTER — Other Ambulatory Visit: Payer: Self-pay | Admitting: Internal Medicine

## 2021-05-05 ENCOUNTER — Ambulatory Visit (INDEPENDENT_AMBULATORY_CARE_PROVIDER_SITE_OTHER): Payer: Medicare Other

## 2021-05-05 ENCOUNTER — Other Ambulatory Visit: Payer: Self-pay

## 2021-05-05 DIAGNOSIS — E538 Deficiency of other specified B group vitamins: Secondary | ICD-10-CM

## 2021-05-05 MED ORDER — CYANOCOBALAMIN 1000 MCG/ML IJ SOLN
1000.0000 ug | Freq: Once | INTRAMUSCULAR | Status: AC
  Administered 2021-05-05: 1000 ug via INTRAMUSCULAR

## 2021-05-05 NOTE — Progress Notes (Signed)
Patient presented for B 12 injection to right deltoid, patient voiced no concerns nor showed any signs of distress during injection. 

## 2021-05-18 DIAGNOSIS — M48062 Spinal stenosis, lumbar region with neurogenic claudication: Secondary | ICD-10-CM | POA: Diagnosis not present

## 2021-05-18 DIAGNOSIS — M5416 Radiculopathy, lumbar region: Secondary | ICD-10-CM | POA: Diagnosis not present

## 2021-05-18 DIAGNOSIS — M5126 Other intervertebral disc displacement, lumbar region: Secondary | ICD-10-CM | POA: Diagnosis not present

## 2021-06-05 ENCOUNTER — Ambulatory Visit: Payer: Medicare Other

## 2021-06-08 ENCOUNTER — Other Ambulatory Visit: Payer: Self-pay

## 2021-06-08 ENCOUNTER — Telehealth: Payer: Self-pay | Admitting: Internal Medicine

## 2021-06-08 ENCOUNTER — Ambulatory Visit (INDEPENDENT_AMBULATORY_CARE_PROVIDER_SITE_OTHER): Payer: Medicare Other

## 2021-06-08 DIAGNOSIS — E538 Deficiency of other specified B group vitamins: Secondary | ICD-10-CM

## 2021-06-08 MED ORDER — CYANOCOBALAMIN 1000 MCG/ML IJ SOLN
1000.0000 ug | Freq: Once | INTRAMUSCULAR | Status: AC
  Administered 2021-06-08: 1000 ug via INTRAMUSCULAR

## 2021-06-08 NOTE — Progress Notes (Signed)
Patient presented for B 12 injection to left deltoid, patient voiced no concerns nor showed any signs of distress during injection. 

## 2021-06-08 NOTE — Telephone Encounter (Signed)
Patient needs a refill on his EUTHYROX 50 MCG tablet

## 2021-06-09 ENCOUNTER — Other Ambulatory Visit: Payer: Self-pay | Admitting: Internal Medicine

## 2021-06-09 NOTE — Telephone Encounter (Signed)
Medication has already been refilled.

## 2021-06-17 DIAGNOSIS — Z872 Personal history of diseases of the skin and subcutaneous tissue: Secondary | ICD-10-CM | POA: Diagnosis not present

## 2021-06-17 DIAGNOSIS — L570 Actinic keratosis: Secondary | ICD-10-CM | POA: Diagnosis not present

## 2021-06-17 DIAGNOSIS — L578 Other skin changes due to chronic exposure to nonionizing radiation: Secondary | ICD-10-CM | POA: Diagnosis not present

## 2021-06-21 ENCOUNTER — Other Ambulatory Visit: Payer: Self-pay | Admitting: Internal Medicine

## 2021-06-22 ENCOUNTER — Other Ambulatory Visit: Payer: Self-pay

## 2021-06-22 MED ORDER — DONEPEZIL HCL 10 MG PO TABS: 10.0000 mg | ORAL_TABLET | Freq: Every day | ORAL | 1 refills | Status: DC

## 2021-07-01 ENCOUNTER — Other Ambulatory Visit: Payer: Self-pay

## 2021-07-01 ENCOUNTER — Ambulatory Visit (INDEPENDENT_AMBULATORY_CARE_PROVIDER_SITE_OTHER): Payer: Medicare Other | Admitting: Internal Medicine

## 2021-07-01 ENCOUNTER — Encounter: Payer: Self-pay | Admitting: Internal Medicine

## 2021-07-01 VITALS — BP 146/72 | HR 44 | Temp 96.4°F | Resp 15 | Ht 67.0 in | Wt 160.4 lb

## 2021-07-01 DIAGNOSIS — E039 Hypothyroidism, unspecified: Secondary | ICD-10-CM

## 2021-07-01 DIAGNOSIS — R7303 Prediabetes: Secondary | ICD-10-CM | POA: Diagnosis not present

## 2021-07-01 DIAGNOSIS — F0150 Vascular dementia without behavioral disturbance: Secondary | ICD-10-CM | POA: Diagnosis not present

## 2021-07-01 DIAGNOSIS — R001 Bradycardia, unspecified: Secondary | ICD-10-CM | POA: Diagnosis not present

## 2021-07-01 DIAGNOSIS — E559 Vitamin D deficiency, unspecified: Secondary | ICD-10-CM

## 2021-07-01 DIAGNOSIS — I10 Essential (primary) hypertension: Secondary | ICD-10-CM

## 2021-07-01 DIAGNOSIS — I2510 Atherosclerotic heart disease of native coronary artery without angina pectoris: Secondary | ICD-10-CM | POA: Diagnosis not present

## 2021-07-01 DIAGNOSIS — E538 Deficiency of other specified B group vitamins: Secondary | ICD-10-CM | POA: Diagnosis not present

## 2021-07-01 LAB — B12 AND FOLATE PANEL
Folate: 15 ng/mL (ref >5.9)
Vitamin B-12: 435 pg/mL (ref 211–911)

## 2021-07-01 LAB — COMPREHENSIVE METABOLIC PANEL
ALT: 11 U/L (ref 0–53)
AST: 16 U/L (ref 0–37)
Albumin: 4.1 g/dL (ref 3.5–5.2)
Alkaline Phosphatase: 114 U/L (ref 39–117)
BUN: 15 mg/dL (ref 6–23)
CO2: 25 mEq/L (ref 19–32)
Calcium: 9.7 mg/dL (ref 8.4–10.5)
Chloride: 107 mEq/L (ref 96–112)
Creatinine, Ser: 1.13 mg/dL (ref 0.40–1.50)
GFR: 61.33 mL/min (ref >60.00)
Glucose, Bld: 86 mg/dL (ref 70–99)
Potassium: 4.6 mEq/L (ref 3.5–5.1)
Sodium: 140 mEq/L (ref 135–145)
Total Bilirubin: 0.7 mg/dL (ref 0.2–1.2)
Total Protein: 6.2 g/dL (ref 6.0–8.3)

## 2021-07-01 LAB — LIPID PANEL
Cholesterol: 128 mg/dL (ref 0–200)
HDL: 47.6 mg/dL (ref >39.00)
LDL Cholesterol: 69 mg/dL (ref 0–99)
NonHDL: 80.53
Total CHOL/HDL Ratio: 3
Triglycerides: 60 mg/dL (ref 0.0–149.0)
VLDL: 12 mg/dL (ref 0.0–40.0)

## 2021-07-01 LAB — HEMOGLOBIN A1C: Hgb A1c MFr Bld: 5.7 % (ref 4.6–6.5)

## 2021-07-01 LAB — TSH: TSH: 1.84 u[IU]/mL (ref 0.35–5.50)

## 2021-07-01 LAB — VITAMIN D 25 HYDROXY (VIT D DEFICIENCY, FRACTURES): VITD: 42.46 ng/mL (ref 30.00–100.00)

## 2021-07-01 MED ORDER — METOPROLOL TARTRATE 50 MG PO TABS: 25.0000 mg | ORAL_TABLET | Freq: Every day | ORAL | 0 refills | Status: DC

## 2021-07-01 NOTE — Assessment & Plan Note (Signed)
Continue telmisartan 40 mg and metoprolol 25 mg daily (1/2 tablet,  Reduced by [place]) .  Stopping diltiazem due to symptomatic bradycardia.  Advised to check BP once daily for one week and send readings to occie

## 2021-07-01 NOTE — Assessment & Plan Note (Signed)
Resting pulse 44,  With ambulation 54.  Stopping diltiazem.  Continue 25 mg metoprolol daily givne CAD and

## 2021-07-01 NOTE — Patient Instructions (Addendum)
Your pulse is very slow,  this will make you tired.  Please suspend the diltiazem .  Today was your LAST DOSE .  This will help improve your pulse   Continue taking 1/2 metoprolol in the morning , starting tomorrow ,  and   Continue taking  telmisartan every night     Check your blood pressure once or twice daily at different times  (morning,  afternoon and evening _) and send me the readings in one week    by phone . Ask for Janett Billow or Juliann Pulse to give readings to.

## 2021-07-01 NOTE — Progress Notes (Signed)
Subjective:  Patient ID: John Burns, male    DOB: Jul 30, 1941  Age: 80 y.o. MRN: [id number]  CC: The primary encounter diagnosis was Prediabetes. Diagnoses of Essential hypertension, B12 deficiency, Hypothyroidism (acquired), Vitamin D deficiency, Bradycardia with 41-50 beats per minute, and Vascular dementia without behavioral disturbance ([place]) were also pertinent to this visit.  HPI John Burns presents for follow up on Aortic atherosclerosis,  vascular dementia , lumbar spinal stenosis . He is accompanied by his wife John Burns  This visit occurred during the SARS-CoV-2 public health emergency.  Safety protocols were in place, including screening questions prior to the visit, additional usage of staff PPE, and extensive cleaning of exam room while observing appropriate contact time as indicated for disinfecting solutions.    Dementia:  "some days are better than others"  she reports increased irritability on husband's part .  Not aggressive toward her.  Watches TV most of the time.  Some garden work but early in the morning. Losing weight due to anorexia . Denies abdominal pain .  Eating less junk food per wife.  Gums stay sore which prevents excessive eating..  has full dentures   Sleeping fine, in bed by 8 pm.  Up by sunrise but stays in bed till 8 or 9:00 .  Energy level ok  Bradycardia;  metoprolol dose was reduced 2-3 months ago by John Burns . Still bradycardic at rest with minimal variation during walk by nurse.   Aortic atherosclerosis :  we reviewed findings of prior CT scan today..  Patient is tolerating high potency statin therapy    Seeing John Burns for back pain with left sided radiculopathy  due to lumbar spinal stenosis.  Had an ESI in June that provided moderate relief and allowed him to stop using Norco except on a prn basis .  He received narcotics from Dr John Burns exclusively.     Outpatient Medications Prior to Visit  Medication Sig Dispense Refill   aspirin 81 MG  tablet Take 1 tablet (81 mg total) by mouth daily. 90 tablet 1   atorvastatin (LIPITOR) 40 MG tablet Take 1 tablet (40 mg total) by mouth daily. 90 tablet 1   Calcium Carb-Cholecalciferol (CALCIUM-VITAMIN D) 500-200 MG-UNIT tablet Take 1 tablet by mouth daily.     donepezil (ARICEPT) 10 MG tablet Take 1 tablet (10 mg total) by mouth at bedtime. 90 tablet 1   EUTHYROX 50 MCG tablet Take 1 tablet by mouth once daily 90 tablet 0   nitroGLYCERIN (NITROSTAT) 0.4 MG SL tablet Place 1 tablet (0.4 mg total) under the tongue every 5 (five) minutes as needed for chest pain. 50 tablet 3   omeprazole (PRILOSEC) 20 MG capsule Take 1 capsule by mouth once daily 90 capsule 0   telmisartan (MICARDIS) 40 MG tablet TAKE 1 TABLET BY MOUTH AT BEDTIME 90 tablet 0   vitamin E 400 UNIT capsule Take 400 Units by mouth daily.     diltiazem (CARDIZEM CD) 120 MG 24 hr capsule Take 1 capsule by mouth once daily 90 capsule 0   metoprolol tartrate (LOPRESSOR) 50 MG tablet Take 1 tablet by mouth once daily 90 tablet 0   omeprazole (PRILOSEC) 20 MG capsule Take 1 capsule by mouth once daily 90 capsule 0   benzonatate (TESSALON) 200 MG capsule Take by mouth. (Patient not taking: Reported on 07/01/2021)     donepezil (ARICEPT) 10 MG tablet TAKE 1 TABLET BY MOUTH ONCE DAILY AT BEDTIME (Patient not taking: Reported on 07/01/2021) 30 tablet  0   No facility-administered medications prior to visit.    Review of Systems;  Patient denies headache, fevers, malaise, unintentional weight loss, skin rash, eye pain, sinus congestion and sinus pain, sore throat, dysphagia,  hemoptysis , cough, dyspnea, wheezing, chest pain, palpitations, orthopnea, edema, abdominal pain, nausea, melena, diarrhea, constipation, flank pain, dysuria, hematuria, urinary  Frequency, nocturia, numbness, tingling, seizures,  Focal weakness, Loss of consciousness,  Tremor, insomnia, depression, anxiety, and suicidal ideation.      Objective:  BP (!) 146/72 (BP  Location: Left Arm, Patient Position: Sitting, Cuff Size: Normal)   Pulse (!) 44   Temp (!) 96.4 F (35.8 C) (Temporal)   Resp 15   Ht '5\' 7"'$  (1.702 m)   Wt 160 lb 6.4 oz (72.8 kg)   SpO2 93%   BMI 25.12 kg/m   BP Readings from Last 3 Encounters:  07/01/21 (!) 146/72  12/30/20 140/68  09/29/20 (!) 120/56    Wt Readings from Last 3 Encounters:  07/01/21 160 lb 6.4 oz (72.8 kg)  12/30/20 162 lb 9.6 oz (73.8 kg)  11/13/20 162 lb (73.5 kg)    General appearance: alert, cooperative and appears stated age Ears: normal TM's and external ear canals both ears Throat: lips, mucosa, and tongue normal; teeth and gums normal Neck: no adenopathy, no carotid bruit, supple, symmetrical, trachea midline and thyroid not enlarged, symmetric, no tenderness/mass/nodules Back: symmetric, no curvature. ROM normal. No CVA tenderness. Lungs: clear to auscultation bilaterally Heart: regular rate and rhythm, S1, S2 normal, no murmur, click, rub or gallop Abdomen: soft, non-tender; bowel sounds normal; no masses,  no organomegaly Pulses: 2+ and symmetric Skin: Skin color, texture, turgor normal. No rashes or lesions Lymph nodes: Cervical, supraclavicular, and axillary nodes normal.  Lab Results  Component Value Date   HGBA1C 5.7 07/01/2021   HGBA1C 5.8 07/09/2020    Lab Results  Component Value Date   CREATININE 1.13 07/01/2021   CREATININE 1.00 12/26/2020   CREATININE 1.24 07/09/2020    Lab Results  Component Value Date   WBC 9.4 01/06/2020   HGB 14.6 01/06/2020   HCT 43.2 01/06/2020   PLT 170 01/06/2020   GLUCOSE 86 07/01/2021   CHOL 128 07/01/2021   TRIG 60.0 07/01/2021   HDL 47.60 07/01/2021   LDLCALC 69 07/01/2021   ALT 11 07/01/2021   AST 16 07/01/2021   NA 140 07/01/2021   K 4.6 07/01/2021   CL 107 07/01/2021   CREATININE 1.13 07/01/2021   BUN 15 07/01/2021   CO2 25 07/01/2021   TSH 1.84 07/01/2021   PSA 2.33 05/22/2015   HGBA1C 5.7 07/01/2021    MR LUMBAR SPINE WO  CONTRAST  Result Date: 04/19/2020 CLINICAL DATA:  Chronic progressive left-sided low back pain and left leg pain. EXAM: MRI LUMBAR SPINE WITHOUT CONTRAST TECHNIQUE: Multiplanar, multisequence MR imaging of the lumbar spine was performed. No intravenous contrast was administered. COMPARISON:  CT scan of the lumbar spine dated 01/06/2020 FINDINGS: Segmentation:  5 lumbar type vertebra. Alignment:  Physiologic. Vertebrae:  No fracture, evidence of discitis, or bone lesion. Conus medullaris and cauda equina: Conus extends to the L1-2 level. Small lipoma of the filum terminalis. Paraspinal and other soft tissues: Sigmoid diverticulosis. Benign left parapelvic renal cysts. Otherwise negative. Disc levels: T12-L1: Disc desiccation.  Otherwise normal. L1-2: Disc desiccation. Small broad-based disc bulge with a tiny protrusion into the inferior aspect of the left neural foramen without neural impingement. Otherwise normal. L2-3: Disc desiccation with degenerative changes of the  vertebral endplates. Soft disc extrusion extending into the superior aspect of the left neural foramen which should affect the left L2 nerve. Small adjacent soft disc protrusion which extends across the midline to the right. Slight symmetrical compression of the thecal sac without spinal stenosis. L3-4: Disc desiccation. Tiny central disc bulge with an annular fissure. No neural impingement. L4-5: Small broad-based disc bulge with a central annular fissure with small protrusion extending into the right lateral recess and right neural foramen. Symmetrical slight compression of the thecal sac without focal neural impingement. No foraminal stenosis. L5-S1: Chronic disc space narrowing. Broad-based disc osteophyte complex extending into the inferior aspects of both neural foramina with moderate bilateral foraminal stenosis. The right L5 nerve appears to exit without impingement. The left L5 nerve appears slightly compressed under the lateral aspect of  the pedicle on image 14 of series 5. Slight hypertrophy of the ligamentum flavum without neural impingement. IMPRESSION: 1. Soft disc extrusion at L2-3 extending into the superior aspect of the left neural foramen which should affect the left L2 nerve. 2. Moderate bilateral foraminal stenosis at L5-S1 which could affect either or both L5 nerves. Electronically Signed   By: Lorriane Shire M.D.   On: 04/19/2020 16:30    Assessment & Plan:   Problem List Items Addressed This Visit       Medium   Vitamin D deficiency   Relevant Orders   VITAMIN D 25 Hydroxy (Vit-D Deficiency, Fractures) (Completed)     Unprioritized   B12 deficiency   Relevant Orders   B12 and Folate Panel (Completed)   Essential hypertension    Continue telmisartan 40 mg and metoprolol 25 mg daily (1/2 tablet,  Reduced by [place]) .  Stopping diltiazem due to symptomatic bradycardia.  Advised to check BP once daily for one week and send readings to [place]       Relevant Medications   metoprolol tartrate (LOPRESSOR) 50 MG tablet   Other Relevant Orders   Comprehensive metabolic panel (Completed)   Lipid panel (Completed)   Bradycardia with 41-50 beats per minute    Resting pulse 44,  With ambulation 54.  Stopping diltiazem.  Continue 25 mg metoprolol daily givne CAD and       Vascular dementia ([employer])    His symptoms have not progressed , and he is tolerating aricept  Except for the bradycardia .  Will suspend diltiazem       Hypothyroidism (acquired)    TSH is therapeutic,  Continue current levothyoxine dose,  Recheck in 6 montths   Lab Results  Component Value Date   TSH 1.84 07/01/2021         Relevant Medications   metoprolol tartrate (LOPRESSOR) 50 MG tablet   Other Relevant Orders   TSH (Completed)   Other Visit Diagnoses     Prediabetes    -  Primary   Relevant Orders   Hemoglobin A1c (Completed)        Meds ordered this encounter  Medications   metoprolol tartrate (LOPRESSOR) 50 MG  tablet    Sig: Take 0.5 tablets (25 mg total) by mouth daily.    Dispense:  45 tablet    Refill:  0    Medications Discontinued During This Encounter  Medication Reason   benzonatate (TESSALON) 200 MG capsule    donepezil (ARICEPT) 10 MG tablet Duplicate   omeprazole (PRILOSEC) 20 MG capsule Duplicate   diltiazem (CARDIZEM CD) 120 MG 24 hr capsule    metoprolol tartrate (LOPRESSOR) 50  MG tablet     Follow-up: No follow-ups on file.   Crecencio Mc, MD

## 2021-07-04 NOTE — Assessment & Plan Note (Signed)
TSH is therapeutic,  Continue current levothyoxine dose,  Recheck in 6 montths   Lab Results  Component Value Date   TSH 1.84 07/01/2021

## 2021-07-04 NOTE — Assessment & Plan Note (Addendum)
His symptoms have not progressed , and he is tolerating aricept  Except for the bradycardia .  Will suspend diltiazem

## 2021-07-10 ENCOUNTER — Ambulatory Visit (INDEPENDENT_AMBULATORY_CARE_PROVIDER_SITE_OTHER): Payer: Medicare Other

## 2021-07-10 ENCOUNTER — Other Ambulatory Visit: Payer: Self-pay

## 2021-07-10 DIAGNOSIS — E538 Deficiency of other specified B group vitamins: Secondary | ICD-10-CM | POA: Diagnosis not present

## 2021-07-10 MED ORDER — CYANOCOBALAMIN 1000 MCG/ML IJ SOLN
1000.0000 ug | Freq: Once | INTRAMUSCULAR | Status: AC
  Administered 2021-07-10: 1000 ug via INTRAMUSCULAR

## 2021-07-10 NOTE — Progress Notes (Signed)
Patient presented for B 12 injection to left deltoid, patient voiced no concerns nor showed any signs of distress during injection. 

## 2021-07-19 ENCOUNTER — Telehealth: Payer: Self-pay | Admitting: Internal Medicine

## 2021-07-19 DIAGNOSIS — I10 Essential (primary) hypertension: Secondary | ICD-10-CM

## 2021-07-19 MED ORDER — METOPROLOL TARTRATE 25 MG PO TABS: 25.0000 mg | ORAL_TABLET | Freq: Two times a day (BID) | ORAL | 11 refills | Status: DC

## 2021-07-19 NOTE — Assessment & Plan Note (Addendum)
Home readings have ranged from 0000000 to 0000000 systolic, with a pulse consistently in the 50's.  Currently taking metoprolol 25 mg  and telmisartan 40 mg.  Will increase dose of telmisartan  to 80 mg

## 2021-07-19 NOTE — Telephone Encounter (Signed)
Home readings reviewed:  BP readings have ranged from 0000000 to 0000000 systolic, with a pulse consistently in the 50's.    Confirm wife wife that he has reduced dose of metoprolol tartrate from 50 mg to 25 g and is taking it  2 times daily  along with  telmisartan 40 mg.  If his metoprolol is metoprolol "succinate"  the dose will be 25 mg once daily .  If so,  will increase dose of telmisartan  to 80 mg as a trial and resend readings in one week

## 2021-07-21 NOTE — Telephone Encounter (Signed)
Spoke with pt's wife and she stated that the pt is taking the metoprolol tartrate 25 mg once daily and the telmisartan 40 mg at bedtime. I let pt know that he needs to increase his telmisartan  to 80 mg at bedtime and continue the 25 mg metoprolol once daily for know and if any other changes are needed I would call her back. Also advised that they continue to check bp once daily for another week and submit the reading to [place].

## 2021-07-29 ENCOUNTER — Telehealth: Payer: Self-pay | Admitting: Internal Medicine

## 2021-07-29 NOTE — Telephone Encounter (Signed)
Patient dropped off bp and pulse rates. Paper is up front in [place]'s color folder.

## 2021-07-30 ENCOUNTER — Encounter: Payer: Self-pay | Admitting: Internal Medicine

## 2021-07-30 NOTE — Telephone Encounter (Signed)
Spoke with pt's wife and informed her that the pt's pulse and bp are okay and that he needs to continue taking the metoprolol and telmisartan.

## 2021-08-10 ENCOUNTER — Other Ambulatory Visit: Payer: Self-pay | Admitting: Internal Medicine

## 2021-08-10 ENCOUNTER — Other Ambulatory Visit: Payer: Self-pay

## 2021-08-10 ENCOUNTER — Ambulatory Visit (INDEPENDENT_AMBULATORY_CARE_PROVIDER_SITE_OTHER): Payer: Medicare Other

## 2021-08-10 DIAGNOSIS — E538 Deficiency of other specified B group vitamins: Secondary | ICD-10-CM

## 2021-08-10 MED ORDER — CYANOCOBALAMIN 1000 MCG/ML IJ SOLN
1000.0000 ug | Freq: Once | INTRAMUSCULAR | Status: AC
  Administered 2021-08-10: 1000 ug via INTRAMUSCULAR

## 2021-08-10 NOTE — Progress Notes (Signed)
Patient presented for B 12 injection to right deltoid, patient voiced no concerns nor showed any signs of distress during injection. 

## 2021-08-11 DIAGNOSIS — R001 Bradycardia, unspecified: Secondary | ICD-10-CM | POA: Diagnosis not present

## 2021-08-11 DIAGNOSIS — I471 Supraventricular tachycardia: Secondary | ICD-10-CM | POA: Diagnosis not present

## 2021-08-11 DIAGNOSIS — E785 Hyperlipidemia, unspecified: Secondary | ICD-10-CM | POA: Diagnosis not present

## 2021-08-11 DIAGNOSIS — I2510 Atherosclerotic heart disease of native coronary artery without angina pectoris: Secondary | ICD-10-CM | POA: Diagnosis not present

## 2021-08-11 DIAGNOSIS — I25708 Atherosclerosis of coronary artery bypass graft(s), unspecified, with other forms of angina pectoris: Secondary | ICD-10-CM | POA: Diagnosis not present

## 2021-08-11 DIAGNOSIS — Z23 Encounter for immunization: Secondary | ICD-10-CM | POA: Diagnosis not present

## 2021-08-11 DIAGNOSIS — J449 Chronic obstructive pulmonary disease, unspecified: Secondary | ICD-10-CM | POA: Diagnosis not present

## 2021-08-11 DIAGNOSIS — R0602 Shortness of breath: Secondary | ICD-10-CM | POA: Diagnosis not present

## 2021-08-11 DIAGNOSIS — Z9889 Other specified postprocedural states: Secondary | ICD-10-CM | POA: Diagnosis not present

## 2021-08-11 DIAGNOSIS — Z951 Presence of aortocoronary bypass graft: Secondary | ICD-10-CM | POA: Diagnosis not present

## 2021-08-26 ENCOUNTER — Telehealth: Payer: Self-pay | Admitting: Internal Medicine

## 2021-08-26 NOTE — Telephone Encounter (Signed)
Patient increased his telmisartan (MICARDIS) 40 MG tablet from 1 pill daily to 2 pills daily. Patient will run out early.   Needing new script sent in for when he runs out. Okay to send in?

## 2021-08-27 MED ORDER — TELMISARTAN 80 MG PO TABS: 80.0000 mg | ORAL_TABLET | Freq: Every day | ORAL | 1 refills | Status: DC

## 2021-08-27 NOTE — Telephone Encounter (Signed)
LMTCB. Need to let pt or pt's wife know that we have refilled the Telmisartan at 80 mg instead of the 40 mg and having to take two pills. Need to make sure that he knows to only take one pill.

## 2021-08-27 NOTE — Addendum Note (Signed)
Addended by: Adair Laundry on: 08/27/2021 02:53 PM   Modules accepted: Orders

## 2021-08-28 NOTE — Telephone Encounter (Signed)
Pts wife is aware 

## 2021-08-31 DIAGNOSIS — R001 Bradycardia, unspecified: Secondary | ICD-10-CM | POA: Diagnosis not present

## 2021-09-01 DIAGNOSIS — Z951 Presence of aortocoronary bypass graft: Secondary | ICD-10-CM | POA: Diagnosis not present

## 2021-09-01 DIAGNOSIS — R0602 Shortness of breath: Secondary | ICD-10-CM | POA: Diagnosis not present

## 2021-09-01 DIAGNOSIS — J449 Chronic obstructive pulmonary disease, unspecified: Secondary | ICD-10-CM | POA: Diagnosis not present

## 2021-09-01 DIAGNOSIS — I25708 Atherosclerosis of coronary artery bypass graft(s), unspecified, with other forms of angina pectoris: Secondary | ICD-10-CM | POA: Diagnosis not present

## 2021-09-01 DIAGNOSIS — I2510 Atherosclerotic heart disease of native coronary artery without angina pectoris: Secondary | ICD-10-CM | POA: Diagnosis not present

## 2021-09-01 DIAGNOSIS — I471 Supraventricular tachycardia: Secondary | ICD-10-CM | POA: Diagnosis not present

## 2021-09-01 DIAGNOSIS — Z9889 Other specified postprocedural states: Secondary | ICD-10-CM | POA: Diagnosis not present

## 2021-09-01 DIAGNOSIS — Z23 Encounter for immunization: Secondary | ICD-10-CM | POA: Diagnosis not present

## 2021-09-01 DIAGNOSIS — R001 Bradycardia, unspecified: Secondary | ICD-10-CM | POA: Diagnosis not present

## 2021-09-02 ENCOUNTER — Telehealth: Payer: Self-pay | Admitting: Internal Medicine

## 2021-09-02 NOTE — Telephone Encounter (Addendum)
Patient's wife calling in and states that he is taking the Telmisartan 40 mg 2 tablets at night. States he forgot to take this last night and wanted to know if he could take one 40 mg tablet this morning?   Please advise   Okay to leave detailed voicemail.

## 2021-09-02 NOTE — Telephone Encounter (Signed)
Spoke with pt's wife and she stated that the pt decided that he would just wait and take it like normal tonight.

## 2021-09-09 ENCOUNTER — Ambulatory Visit (INDEPENDENT_AMBULATORY_CARE_PROVIDER_SITE_OTHER): Payer: Medicare Other

## 2021-09-09 ENCOUNTER — Other Ambulatory Visit: Payer: Self-pay

## 2021-09-09 DIAGNOSIS — E538 Deficiency of other specified B group vitamins: Secondary | ICD-10-CM | POA: Diagnosis not present

## 2021-09-09 DIAGNOSIS — Z23 Encounter for immunization: Secondary | ICD-10-CM

## 2021-09-09 MED ORDER — CYANOCOBALAMIN 1000 MCG/ML IJ SOLN
1000.0000 ug | Freq: Once | INTRAMUSCULAR | Status: AC
  Administered 2021-09-09: 1000 ug via INTRAMUSCULAR

## 2021-09-09 NOTE — Progress Notes (Signed)
Patient presented for B 12 injection to right deltoid, patient voiced no concerns nor showed any signs of distress during injection. 

## 2021-10-12 ENCOUNTER — Other Ambulatory Visit: Payer: Self-pay

## 2021-10-12 ENCOUNTER — Ambulatory Visit (INDEPENDENT_AMBULATORY_CARE_PROVIDER_SITE_OTHER): Payer: Medicare Other | Admitting: Adult Health

## 2021-10-12 ENCOUNTER — Ambulatory Visit (INDEPENDENT_AMBULATORY_CARE_PROVIDER_SITE_OTHER): Payer: Medicare Other

## 2021-10-12 ENCOUNTER — Encounter: Payer: Self-pay | Admitting: Adult Health

## 2021-10-12 ENCOUNTER — Ambulatory Visit: Payer: Medicare Other

## 2021-10-12 VITALS — BP 124/68 | HR 59 | Temp 96.8°F | Ht 67.01 in | Wt 159.2 lb

## 2021-10-12 DIAGNOSIS — E538 Deficiency of other specified B group vitamins: Secondary | ICD-10-CM | POA: Diagnosis not present

## 2021-10-12 DIAGNOSIS — M79645 Pain in left finger(s): Secondary | ICD-10-CM | POA: Diagnosis not present

## 2021-10-12 DIAGNOSIS — M79642 Pain in left hand: Secondary | ICD-10-CM | POA: Diagnosis not present

## 2021-10-12 MED ORDER — CYANOCOBALAMIN 1000 MCG/ML IJ SOLN
1000.0000 ug | Freq: Once | INTRAMUSCULAR | Status: AC
  Administered 2021-10-12: 1000 ug via INTRAMUSCULAR

## 2021-10-12 NOTE — Progress Notes (Signed)
Thumb   Acute Office Visit  Subjective:    Patient ID: John Burns, male    DOB: [birth year], 80 y.o.   MRN: [id number]  Chief Complaint  Patient presents with   Hand Pain    Pt states he has pain in his left thumb area. Has been going on for about a week. Limited ROM    Hand Pain  The incident occurred more than 1 week ago (2 weeks in left hand.). Incident location: no known injury. There was no injury mechanism. The pain is present in the left fingers (thumb and left hand.). The quality of the pain is described as aching. The pain is at a severity of 0/10. The pain is mild. The pain has been Intermittent since the incident. Pertinent negatives include no chest pain, muscle weakness, numbness or tingling. The symptoms are aggravated by movement. He has tried acetaminophen for the symptoms. The treatment provided mild relief.  He denies any injury of the hand however he does report he has been getting up a lot of leaves in his yard with his wife in the recent weeks. Denies any previous hand surgeries. Right hand dominant. Has tried NSAID once.  Patient  denies any fever, body aches,chills, rash, chest pain, shortness of breath, nausea, vomiting, or diarrhea.  Due for b12 here today for that.   Past Medical History:  Diagnosis Date   ASCVD (arteriosclerotic cardiovascular disease)    Bladder neck obstruction    CAD (coronary artery disease) 2007   s/p PCI to mid LAD   COPD (chronic obstructive pulmonary disease) (HCC)    Diverticulosis    GERD (gastroesophageal reflux disease)    Heart attack ([place])    History of hiatal hernia    History of tobacco abuse    HOH (hard of hearing)    Hypercholesteremia    Hypertension    Shortness of breath dyspnea    on exertion   Stenosis of coronary stent 2013    Past Surgical History:  Procedure Laterality Date   ANGIOPLASTY / STENTING FEMORAL     CARDIAC CATHETERIZATION  05/10/12   patent LAD stent w/ 50% in stent restenosis; 70%  stenosis ostium of left circumflex; 75% stenosis mid left circumflex   CATARACT EXTRACTION W/PHACO Left 12/29/2015   Procedure: CATARACT EXTRACTION PHACO AND INTRAOCULAR LENS PLACEMENT (IOC);  Surgeon: Estill Cotta, MD;  Location: [facility] ORS;  Service: Ophthalmology;  Laterality: Left;  [place]: 01:53.8 AP%: 26.2 CDE: 49.38 Lot # [id number]   CATARACT EXTRACTION W/PHACO Right 01/26/2016   Procedure: CATARACT EXTRACTION PHACO AND INTRAOCULAR LENS PLACEMENT (IOC);  Surgeon: Estill Cotta, MD;  Location: [facility] ORS;  Service: Ophthalmology;  Laterality: Right;  [place] 01:13 AP% 25.3 CDE 33.95 fluid pack lot # [id number]   COLONOSCOPY  02/05/14   CORONARY ARTERY BYPASS GRAFT  06/04/14   x2 [place] PLACEMENT  2007   ESOPHAGOGASTRODUODENOSCOPY  02/05/14    Family History  Problem Relation Age of Onset   Heart disease Mother    Cancer Father 75       prostate and colon cancer    Social History   Socioeconomic History   Marital status: Married    Spouse name: Not on file   Number of children: Not on file   Years of education: Not on file   Highest education level: Not on file  Occupational History   Not on file  Tobacco Use   Smoking status: Former    Packs/day:  1.00    Years: 50.00    Pack years: 50.00    Types: Cigarettes    Quit date: 11/22/1993    Years since quitting: 27.9   Smokeless tobacco: Former    Types: Chew   Tobacco comments:    used chew to help quit smoking X1 year.  Substance and Sexual Activity   Alcohol use: No   Drug use: No   Sexual activity: Yes  Other Topics Concern   Not on file  Social History Narrative   Not on file   Social Determinants of Health   Financial Resource Strain: Low Risk    Difficulty of Paying Living Expenses: Not hard at all  Food Insecurity: No Food Insecurity   Worried About Charity fundraiser in the Last Year: Never true   [place] in the Last Year: Never true  Transportation Needs: No Transportation  Needs   Lack of Transportation (Medical): No   Lack of Transportation (Non-Medical): No  Physical Activity: Not on file  Stress: No Stress Concern Present   Feeling of Stress : Not at all  Social Connections: Unknown   Frequency of Communication with Friends and Family: Not on file   Frequency of Social Gatherings with Friends and Family: Not on file   Attends Religious Services: Not on file   Active Member of Clubs or Organizations: Not on file   Attends Archivist Meetings: Not on file   Marital Status: Married  Human resources officer Violence: Not At Risk   Fear of Current or Ex-Partner: No   Emotionally Abused: No   Physically Abused: No   Sexually Abused: No    Outpatient Medications Prior to Visit  Medication Sig Dispense Refill   aspirin 81 MG tablet Take 1 tablet (81 mg total) by mouth daily. 90 tablet 1   atorvastatin (LIPITOR) 40 MG tablet Take 1 tablet (40 mg total) by mouth daily. 90 tablet 1   Calcium Carb-Cholecalciferol (CALCIUM-VITAMIN D) 500-200 MG-UNIT tablet Take 1 tablet by mouth daily.     donepezil (ARICEPT) 10 MG tablet Take 1 tablet (10 mg total) by mouth at bedtime. 90 tablet 1   EUTHYROX 50 MCG tablet Take 1 tablet by mouth once daily 90 tablet 0   metoprolol tartrate (LOPRESSOR) 25 MG tablet Take 1 tablet (25 mg total) by mouth 2 (two) times daily. 60 tablet 11   nitroGLYCERIN (NITROSTAT) 0.4 MG SL tablet Place 1 tablet (0.4 mg total) under the tongue every 5 (five) minutes as needed for chest pain. 50 tablet 3   omeprazole (PRILOSEC) 20 MG capsule Take 1 capsule by mouth once daily 90 capsule 0   telmisartan (MICARDIS) 80 MG tablet Take 1 tablet (80 mg total) by mouth daily. 90 tablet 1   vitamin E 400 UNIT capsule Take 400 Units by mouth daily.     telmisartan (MICARDIS) 40 MG tablet TAKE 1 TABLET BY MOUTH AT BEDTIME (Patient not taking: Reported on 10/12/2021) 90 tablet 0   No facility-administered medications prior to visit.    No Known  Allergies  Review of Systems  Constitutional: Negative.   HENT: Negative.    Respiratory: Negative.    Cardiovascular: Negative.  Negative for chest pain.  Genitourinary: Negative.   Musculoskeletal:  Positive for arthralgias. Negative for back pain, gait problem, joint swelling, myalgias, neck pain and neck stiffness.  Skin: Negative.   Neurological:  Negative for tingling and numbness.  Psychiatric/Behavioral: Negative.  Objective:    Physical Exam Vitals reviewed.  Constitutional:      General: He is not in acute distress.    Appearance: Normal appearance. He is well-developed. He is not ill-appearing, toxic-appearing or diaphoretic.  HENT:     Head: Normocephalic and atraumatic.     Right Ear: Hearing, tympanic membrane, ear canal and external ear normal.     Left Ear: Hearing, tympanic membrane, ear canal and external ear normal.     Nose: Nose normal.     Mouth/Throat:     Pharynx: Uvula midline. No oropharyngeal exudate.  Eyes:     General: Lids are normal. No scleral icterus.       Right eye: No discharge.        Left eye: No discharge.     Conjunctiva/sclera: Conjunctivae normal.     Pupils: Pupils are equal, round, and reactive to light.  Neck:     Thyroid: No thyroid mass, thyromegaly or thyroid tenderness.     Vascular: Normal carotid pulses. No carotid bruit, hepatojugular reflux or JVD.     Trachea: Trachea and phonation normal. No tracheal tenderness or tracheal deviation.     Meningeal: Brudzinski's sign absent.  Cardiovascular:     Rate and Rhythm: Normal rate and regular rhythm.     Pulses: Normal pulses.     Heart sounds: Normal heart sounds, S1 normal and S2 normal. Heart sounds not distant. No murmur heard.   No friction rub. No gallop.  Pulmonary:     Effort: Pulmonary effort is normal. No accessory muscle usage or respiratory distress.     Breath sounds: Normal breath sounds. No stridor. No wheezing or rales.  Chest:     Chest wall: No  tenderness.  Abdominal:     General: Bowel sounds are normal. There is no distension.     Palpations: Abdomen is soft. There is no mass.     Tenderness: There is no abdominal tenderness. There is no guarding or rebound.  Musculoskeletal:        General: No tenderness or deformity. Normal range of motion.     Right upper arm: Normal.     Left upper arm: Normal.     Right forearm: Normal.     Left forearm: Normal.     Right wrist: Normal. No snuff box tenderness.     Left wrist: Normal. No snuff box tenderness.     Right hand: Normal. No swelling, tenderness or bony tenderness. Normal range of motion. Normal strength. Normal sensation. Normal pulse.     Left hand: Normal. No swelling, tenderness or bony tenderness. Normal range of motion. Normal strength. Normal sensation. Normal pulse.     Cervical back: Full passive range of motion without pain, normal range of motion and neck supple.  Lymphadenopathy:     Head:     Right side of head: No submental, submandibular, tonsillar, preauricular, posterior auricular or occipital adenopathy.     Left side of head: No submental, submandibular, tonsillar, preauricular, posterior auricular or occipital adenopathy.     Cervical: No cervical adenopathy.  Skin:    General: Skin is warm and dry.     Coloration: Skin is not pale.     Findings: No erythema or rash.     Nails: There is no clubbing.  Neurological:     Mental Status: He is alert and oriented to person, place, and time.     GCS: GCS eye subscore is 4. GCS verbal subscore is 5.  GCS motor subscore is 6.     Cranial Nerves: No cranial nerve deficit.     Sensory: No sensory deficit.     Motor: No abnormal muscle tone.     Coordination: Coordination normal.     Deep Tendon Reflexes: Reflexes are normal and symmetric. Reflexes normal.  Psychiatric:        Speech: Speech normal.        Behavior: Behavior normal.        Thought Content: Thought content normal.        Judgment: Judgment  normal.    BP 124/68   Pulse (!) 59   Temp (!) 96.8 F (36 C)   Ht 5' 7.01" (1.702 m)   Wt 159 lb 3.2 oz (72.2 kg)   SpO2 98%   BMI 24.93 kg/m  Wt Readings from Last 3 Encounters:  10/12/21 159 lb 3.2 oz (72.2 kg)  07/01/21 160 lb 6.4 oz (72.8 kg)  12/30/20 162 lb 9.6 oz (73.8 kg)    Health Maintenance Due  Topic Date Due   Zoster Vaccines- Shingrix (1 of 2) Never done   COVID-19 Vaccine (4 - Booster for Moderna series) 12/17/2020    There are no preventive care reminders to display for this patient.   Lab Results  Component Value Date   TSH 1.84 07/01/2021   Lab Results  Component Value Date   WBC 9.4 01/06/2020   HGB 14.6 01/06/2020   HCT 43.2 01/06/2020   MCV 93.1 01/06/2020   PLT 170 01/06/2020   Lab Results  Component Value Date   NA 140 07/01/2021   K 4.6 07/01/2021   CO2 25 07/01/2021   GLUCOSE 86 07/01/2021   BUN 15 07/01/2021   CREATININE 1.13 07/01/2021   BILITOT 0.7 07/01/2021   ALKPHOS 114 07/01/2021   AST 16 07/01/2021   ALT 11 07/01/2021   PROT 6.2 07/01/2021   ALBUMIN 4.1 07/01/2021   CALCIUM 9.7 07/01/2021   ANIONGAP 7 01/06/2020   GFR 61.33 07/01/2021   Lab Results  Component Value Date   CHOL 128 07/01/2021   Lab Results  Component Value Date   HDL 47.60 07/01/2021   Lab Results  Component Value Date   LDLCALC 69 07/01/2021   Lab Results  Component Value Date   TRIG 60.0 07/01/2021   Lab Results  Component Value Date   CHOLHDL 3 07/01/2021   Lab Results  Component Value Date   HGBA1C 5.7 07/01/2021       Assessment & Plan:   Problem List Items Addressed This Visit       Other   B12 deficiency   Other Visit Diagnoses     Left hand pain    -  Primary   Relevant Orders   CBC with Differential/Platelet   DG Hand Complete Left (Completed)     Left arm and hand exam within normal limits.  Cervical exam within normal limits.  He reports pain only in left thumb does not radiate.  Exam is normal will  x-ray. Try NSAID daily for two weeks. Return if not improved.  Meds ordered this encounter  Medications   cyanocobalamin ((VITAMIN B-12)) injection 1,000 mcg   Red Flags discussed. The patient was given clear instructions to go to ER or return to medical center if any red flags develop, symptoms do not improve, worsen or new problems develop. They verbalized understanding.  Return in about 2 weeks (around 10/26/2021), or if symptoms worsen or fail to improve,  for at any time for any worsening symptoms, Go to Emergency room/ urgent care if worse.  Marcille Buffy, FNP

## 2021-10-12 NOTE — Patient Instructions (Signed)
Hand Pain Many things can cause hand pain. Some common causes are: An injury. Repeating the same movement with your hand over and over (overuse). Osteoporosis. Arthritis. Lumps in the tendons or joints of the hand and wrist (ganglion cysts). Nerve compression syndromes (carpal tunnel syndrome). Inflammation of the tendons (tendinitis). Infection. Follow these instructions at home: Pay attention to any changes in your symptoms. Take these actions to help with your discomfort: Managing pain, stiffness, and swelling  Take over-the-counter and prescription medicines only as told by your health care provider. Wear a hand splint or support as told by your health care provider. If directed, put ice on the affected area: Put ice in a plastic bag. Place a towel between your skin and the bag. Leave the ice on for 20 minutes, 2-3 times a day. Activity Take breaks from repetitive activity often. Avoid activities that make your pain worse. Minimize stress on your hands and wrists as much as possible. Do stretches or exercises as told by your health care provider. Do not do activities that make your pain worse. Contact a health care provider if: Your pain does not get better after a few days of self-care. Your pain gets worse. Your pain affects your ability to do your daily activities. Get help right away if: Your hand becomes warm, red, or swollen. Your hand is numb or tingling. Your hand is extremely swollen or deformed. Your hand or fingers turn white or blue. You cannot move your hand, wrist, or fingers. Summary Many things can cause hand pain. Contact your health care provider if your pain does not get better after a few days of self care. Minimize stress on your hands and wrists as much as possible. Do not do activities that make your pain worse. This information is not intended to replace advice given to you by your health care provider. Make sure you discuss any questions you have  with your health care provider. Document Revised: 02/26/2021 Document Reviewed: 02/26/2021 Elsevier Patient Education  [place].

## 2021-10-13 ENCOUNTER — Encounter: Payer: Self-pay | Admitting: Adult Health

## 2021-10-13 LAB — CBC WITH DIFFERENTIAL/PLATELET
Basophils Absolute: 0.1 10*3/uL (ref 0.0–0.1)
Basophils Relative: 1.3 % (ref 0.0–3.0)
Eosinophils Absolute: 0.3 10*3/uL (ref 0.0–0.7)
Eosinophils Relative: 4.1 % (ref 0.0–5.0)
HCT: 42.9 % (ref 39.0–52.0)
Hemoglobin: 14.2 g/dL (ref 13.0–17.0)
Lymphocytes Relative: 20.4 % (ref 12.0–46.0)
Lymphs Abs: 1.4 10*3/uL (ref 0.7–4.0)
MCHC: 33.2 g/dL (ref 30.0–36.0)
MCV: 95.5 fl (ref 78.0–100.0)
Monocytes Absolute: 0.7 10*3/uL (ref 0.1–1.0)
Monocytes Relative: 9.7 % (ref 3.0–12.0)
Neutro Abs: 4.4 10*3/uL (ref 1.4–7.7)
Neutrophils Relative %: 64.5 % (ref 43.0–77.0)
Platelets: 140 10*3/uL — ABNORMAL LOW (ref 150.0–400.0)
RBC: 4.49 Mil/uL (ref 4.22–5.81)
RDW: 13.1 % (ref 11.5–15.5)
WBC: 6.8 10*3/uL (ref 4.0–10.5)

## 2021-10-13 NOTE — Progress Notes (Signed)
Hand x ray is within normal limits, if he tries NSAID for 10 days and no improvement would recommend follow up at [facility] they have walk in Monday to Friday 1pm to 7pm.

## 2021-10-13 NOTE — Progress Notes (Signed)
CBC is okay, platelets mild decrease, stable as in past. Recheck CBC in 1- 2 months advised.

## 2021-11-12 ENCOUNTER — Other Ambulatory Visit: Payer: Self-pay

## 2021-11-12 ENCOUNTER — Ambulatory Visit (INDEPENDENT_AMBULATORY_CARE_PROVIDER_SITE_OTHER): Payer: Medicare Other

## 2021-11-12 DIAGNOSIS — E538 Deficiency of other specified B group vitamins: Secondary | ICD-10-CM | POA: Diagnosis not present

## 2021-11-12 MED ORDER — CYANOCOBALAMIN 1000 MCG/ML IJ SOLN
1000.0000 ug | Freq: Once | INTRAMUSCULAR | Status: AC
  Administered 2021-11-12: 12:00:00 1000 ug via INTRAMUSCULAR

## 2021-11-12 NOTE — Progress Notes (Signed)
Patient presented for B 12 injection to left deltoid, patient voiced no concerns nor showed any signs of distress during injection. 

## 2021-11-13 ENCOUNTER — Ambulatory Visit (INDEPENDENT_AMBULATORY_CARE_PROVIDER_SITE_OTHER): Payer: Medicare Other

## 2021-11-13 VITALS — Ht 67.0 in | Wt 150.0 lb

## 2021-11-13 DIAGNOSIS — Z0000 Encounter for general adult medical examination without abnormal findings: Secondary | ICD-10-CM

## 2021-11-13 NOTE — Progress Notes (Signed)
Subjective:   John Burns is a 80 y.o. male who presents for Medicare Annual/Subsequent preventive examination.  Review of Systems    No ROS.  Medicare Wellness Virtual Visit.  Visual/audio telehealth visit, UTA vital signs.   See social history for additional risk factors.   Cardiac Risk Factors include: advanced age (>72men, >4 women);male gender     Objective:    Today's Vitals   11/13/21 0903  Weight: 150 lb (68 kg)  Height: 5\' 7"  (1.702 m)   Body mass index is 23.49 kg/m.  Advanced Directives 11/13/2021 11/12/2020 01/06/2020 11/12/2019 07/20/2018 07/19/2016 12/29/2015  Does Patient Have a Medical Advance Directive? No No No No No Yes -  Type of Advance Directive - - - - - Press photographer -  Does patient want to make changes to medical advance directive? - - - No - Patient declined - - -  Copy of [place] in Chart? - - - - - No - copy requested -  Would patient like information on creating a medical advance directive? No - Patient declined No - Patient declined No - Patient declined - No - Patient declined - Yes - Educational materials given    Current Medications (verified) Outpatient Encounter Medications as of 11/13/2021  Medication Sig   aspirin 81 MG tablet Take 1 tablet (81 mg total) by mouth daily.   atorvastatin (LIPITOR) 40 MG tablet Take 1 tablet (40 mg total) by mouth daily.   Calcium Carb-Cholecalciferol (CALCIUM-VITAMIN D) 500-200 MG-UNIT tablet Take 1 tablet by mouth daily.   donepezil (ARICEPT) 10 MG tablet Take 1 tablet (10 mg total) by mouth at bedtime.   EUTHYROX 50 MCG tablet Take 1 tablet by mouth once daily   metoprolol tartrate (LOPRESSOR) 25 MG tablet Take 1 tablet (25 mg total) by mouth 2 (two) times daily.   nitroGLYCERIN (NITROSTAT) 0.4 MG SL tablet Place 1 tablet (0.4 mg total) under the tongue every 5 (five) minutes as needed for chest pain.   omeprazole (PRILOSEC) 20 MG capsule Take 1 capsule by mouth once  daily   telmisartan (MICARDIS) 40 MG tablet TAKE 1 TABLET BY MOUTH AT BEDTIME (Patient not taking: Reported on 10/12/2021)   telmisartan (MICARDIS) 80 MG tablet Take 1 tablet (80 mg total) by mouth daily.   vitamin E 400 UNIT capsule Take 400 Units by mouth daily.   No facility-administered encounter medications on file as of 11/13/2021.    Allergies (verified) Patient has no known allergies.   History: Past Medical History:  Diagnosis Date   ASCVD (arteriosclerotic cardiovascular disease)    Bladder neck obstruction    CAD (coronary artery disease) 2007   s/p PCI to mid LAD   COPD (chronic obstructive pulmonary disease) (HCC)    Diverticulosis    GERD (gastroesophageal reflux disease)    Heart attack ([place])    History of hiatal hernia    History of tobacco abuse    HOH (hard of hearing)    Hypercholesteremia    Hypertension    Shortness of breath dyspnea    on exertion   Stenosis of coronary stent 2013   Past Surgical History:  Procedure Laterality Date   ANGIOPLASTY / STENTING FEMORAL     CARDIAC CATHETERIZATION  05/10/12   patent LAD stent w/ 50% in stent restenosis; 70% stenosis ostium of left circumflex; 75% stenosis mid left circumflex   CATARACT EXTRACTION W/PHACO Left 12/29/2015   Procedure: CATARACT EXTRACTION PHACO AND INTRAOCULAR LENS  PLACEMENT (IOC);  Surgeon: Estill Cotta, MD;  Location: [facility] ORS;  Service: Ophthalmology;  Laterality: Left;  [place]: 01:53.8 AP%: 26.2 CDE: 49.38 Lot # [id number]   CATARACT EXTRACTION W/PHACO Right 01/26/2016   Procedure: CATARACT EXTRACTION PHACO AND INTRAOCULAR LENS PLACEMENT (IOC);  Surgeon: Estill Cotta, MD;  Location: [facility] ORS;  Service: Ophthalmology;  Laterality: Right;  [place] 01:13 AP% 25.3 CDE 33.95 fluid pack lot # [id number]   COLONOSCOPY  02/05/14   CORONARY ARTERY BYPASS GRAFT  06/04/14   x2 [place]  2007   ESOPHAGOGASTRODUODENOSCOPY  02/05/14   Family History  Problem Relation Age  of Onset   Heart disease Mother    Cancer Father 61       prostate and colon cancer   Social History   Socioeconomic History   Marital status: Married    Spouse name: Not on file   Number of children: Not on file   Years of education: Not on file   Highest education level: Not on file  Occupational History   Not on file  Tobacco Use   Smoking status: Former    Packs/day: 1.00    Years: 50.00    Pack years: 50.00    Types: Cigarettes    Quit date: 11/22/1993    Years since quitting: 27.9   Smokeless tobacco: Former    Types: Chew   Tobacco comments:    used chew to help quit smoking X1 year.  Substance and Sexual Activity   Alcohol use: No   Drug use: No   Sexual activity: Yes  Other Topics Concern   Not on file  Social History Narrative   Not on file   Social Determinants of Health   Financial Resource Strain: Low Risk    Difficulty of Paying Living Expenses: Not hard at all  Food Insecurity: No Food Insecurity   Worried About Charity fundraiser in the Last Year: Never true   [place] in the Last Year: Never true  Transportation Needs: No Transportation Needs   Lack of Transportation (Medical): No   Lack of Transportation (Non-Medical): No  Physical Activity: Not on file  Stress: No Stress Concern Present   Feeling of Stress : Not at all  Social Connections: Unknown   Frequency of Communication with Friends and Family: Not on file   Frequency of Social Gatherings with Friends and Family: Not on file   Attends Religious Services: Not on file   Active Member of Clubs or Organizations: Not on file   Attends Archivist Meetings: Not on file   Marital Status: Married    Tobacco Counseling Counseling given: Not Answered Tobacco comments: used chew to help quit smoking X1 year.   Clinical Intake:  Pre-visit preparation completed: Yes        Diabetes: No      Interpreter Needed?: No      Activities of Daily Living In your  present state of health, do you have any difficulty performing the following activities: 11/13/2021  Hearing? N  Vision? N  Difficulty concentrating or making decisions? 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 N  Dressing or bathing? N  Doing errands, shopping? Y  Comment Wife assist  Preparing Food and eating ? N  Using the Toilet? N  In the past six months, have you accidently leaked urine? N  Do you have problems with loss of bowel control? N  Managing your  Medications? Y  Comment Wife assist  Managing your Finances? Y  Comment Wife assist  Housekeeping or managing your Housekeeping? N  Some recent data might be hidden    Patient Care Team: Crecencio Mc, MD as PCP - General (Internal Medicine)  Indicate any recent Medical Services you may have received from other than [facility] providers in the past year (date may be approximate).     Assessment:   This is a routine wellness examination for [facility].  Virtual Visit via Telephone Note  I connected with  Judie Bonus on 11/13/21 at  9:00 AM EST by telephone and verified that I am speaking with the correct person using two identifiers.  Persons participating in the virtual visit: patient/Nurse Health Advisor   I discussed the limitations, risks, security and privacy concerns of performing an evaluation and management service by telephone and the availability of in person appointments. The patient expressed understanding and agreed to proceed.  Interactive audio and video telecommunications were attempted between this nurse and patient, however failed, due to patient having technical difficulties OR patient did not have access to video capability.  We continued and completed visit with audio only.  Some vital signs may be absent or patient reported.   Hearing/Vision screen Hearing Screening - Comments:: Patient is able to hear conversational tones without difficulty.  No issues reported.  Vision  Screening - Comments:: Followed by [facility]  Wears corrective lenses  Cataract extraction, bilateral They have regular follow up with the ophthalmologist  Dietary issues and exercise activities discussed: Current Exercise Habits: Home exercise routine, Intensity: Mild Regular diet Good water intake   Goals Addressed               This Visit's Progress     Patient Stated     Follow up with Primary Care Provider (pt-stated)        As needed       Depression Screen PHQ 2/9 Scores 11/13/2021 10/12/2021 07/01/2021 12/30/2020 11/12/2020 09/29/2020 03/26/2020  PHQ - 2 Score 0 0 2 0 - 3 1  PHQ- 9 Score - - 11 - - 9 8  Exception Documentation - - - - (No Data) - -    Fall Risk Fall Risk  11/13/2021 10/12/2021 07/01/2021 12/30/2020 11/13/2020  Falls in the past year? 0 0 0 0 0  Number falls in past yr: 0 0 - 0 0  Injury with Fall? - 0 - 0 0  Comment - - - - -  Follow up Falls evaluation completed Falls evaluation completed Falls evaluation completed Falls evaluation completed Falls evaluation completed    [place]: Home free of loose throw rugs in walkways, pet beds, electrical cords, etc? Yes  Adequate lighting in your home to reduce risk of falls? Yes   ASSISTIVE DEVICES UTILIZED TO PREVENT FALLS: Life alert? No  Use of a cane, walker or w/c? No   TIMED UP AND GO: Was the test performed? No .   Cognitive Function: Patient is alert.  MMSE - Mini Mental State Exam 11/13/2021 07/20/2018 07/19/2016  Not completed: Unable to complete - -  Orientation to time - 5 5  Orientation to Place - 5 5  Registration - 3 3  Attention/ Calculation - 5 5  Recall - 3 2  Language- name 2 objects - 2 2  Language- repeat - 1 1  Language- follow 3 step command - 3 3  Language- read & follow  direction - 1 1  Write a sentence - 1 1  Copy design - 1 1  Total score - 30 29     6CIT Screen 11/12/2020 11/12/2019 07/19/2017  What Year? 0 points 0  points 0 points  What month? 0 points 0 points 0 points  What time? 0 points 0 points 0 points  Count back from 20 0 points 0 points 0 points  Months in reverse 0 points 2 points 0 points  Repeat phrase 10 points 4 points 0 points  Total Score 10 6 0    Immunizations Immunization History  Administered Date(s) Administered   Fluad Quad(high Dose 65+) 08/20/2019, 09/16/2020, 09/09/2021   Influenza Split 09/26/2013   Influenza, High Dose Seasonal PF 07/28/2016, 08/23/2017, 09/05/2018   Influenza,inj,Quad PF,6+ Mos 08/28/2014, 09/30/2015   Moderna Sars-Covid-2 Vaccination 12/05/2019, 01/02/2020, 10/22/2020   Pneumococcal Conjugate-13 12/18/2014   Pneumococcal Polysaccharide-23 11/26/2013   Tdap 11/21/2009   Zoster, Live 11/21/2004   TDAP status: Due, Education has been provided regarding the importance of this vaccine. Advised may receive this vaccine at local pharmacy or Health Dept. Aware to provide a copy of the vaccination record if obtained from local pharmacy or Health Dept. Verbalized acceptance and understanding. Deferred.   Shingrix Completed?: No.    Education has been provided regarding the importance of this vaccine. Patient has been advised to call insurance company to determine out of pocket expense if they have not yet received this vaccine. Advised may also receive vaccine at local pharmacy or Health Dept. Verbalized acceptance and understanding.  Screening Tests Health Maintenance  Topic Date Due   COVID-19 Vaccine (4 - Booster for Moderna series) 11/29/2021 (Originally 12/17/2020)   Zoster Vaccines- Shingrix (1 of 2) 02/11/2022 (Originally 11/10/1990)   TETANUS/TDAP  11/13/2022 (Originally 11/22/2019)   Pneumonia Vaccine 72+ Years old  Completed   INFLUENZA VACCINE  Completed   HPV VACCINES  Aged Out   COLONOSCOPY (Pts 45-74yrs Insurance coverage will need to be confirmed)  Discontinued   Health Maintenance There are no preventive care reminders to display for this  patient.  Lung Cancer Screening: (Low Dose CT Chest recommended if Age 45-80 years, 30 pack-year currently smoking OR have quit w/in 15years.) does not qualify.   Hepatitis C Screening: does not qualify  Vision Screening: Recommended annual ophthalmology exams for early detection of glaucoma and other disorders of the eye.  Dental Screening: Recommended annual dental exams for proper oral hygiene  Community Resource Referral / Chronic Care Management: CRR required this visit?  No   CCM required this visit?  No      Plan:   Keep all routine maintenance appointments.   I have personally reviewed and noted the following in the patients chart:   Medical and social history Use of alcohol, tobacco or illicit drugs  Current medications and supplements including opioid prescriptions. Patient is not currently taking opioid prescriptions. Functional ability and status Nutritional status Physical activity Advanced directives List of other physicians Hospitalizations, surgeries, and ER visits in previous 12 months Vitals Screenings to include cognitive, depression, and falls Referrals and appointments  In addition, I have reviewed and discussed with patient certain preventive protocols, quality metrics, and best practice recommendations. A written personalized care plan for preventive services as well as general preventive health recommendations were provided to patient.     Varney Biles, LPN   [id number]

## 2021-11-13 NOTE — Patient Instructions (Addendum)
°  Mr. Minjares , Thank you for taking time to come for your Medicare Wellness Visit. I appreciate your ongoing commitment to your health goals. Please review the following plan we discussed and let me know if I can assist you in the future.   These are the goals we discussed:  Goals       Patient Stated     Follow up with Primary Care Provider (pt-stated)      As needed        This is a list of the screening recommended for you and due dates:  Health Maintenance  Topic Date Due   COVID-19 Vaccine (4 - Booster for Moderna series) 11/29/2021*   Zoster (Shingles) Vaccine (1 of 2) 02/11/2022*   Tetanus Vaccine  11/13/2022*   Pneumonia Vaccine  Completed   Flu Shot  Completed   HPV Vaccine  Aged Out   Colon Cancer Screening  Discontinued  *Topic was postponed. The date shown is not the original due date.

## 2021-11-18 ENCOUNTER — Other Ambulatory Visit: Payer: Self-pay

## 2021-11-18 MED ORDER — ATORVASTATIN CALCIUM 40 MG PO TABS: 40.0000 mg | ORAL_TABLET | Freq: Every day | ORAL | 3 refills | Status: DC

## 2021-11-24 DIAGNOSIS — H348312 Tributary (branch) retinal vein occlusion, right eye, stable: Secondary | ICD-10-CM | POA: Diagnosis not present

## 2021-11-25 ENCOUNTER — Telehealth: Payer: Self-pay

## 2021-11-25 NOTE — Telephone Encounter (Signed)
Updated pt's pharmacy per pt's wife.

## 2021-11-26 ENCOUNTER — Telehealth: Payer: Self-pay | Admitting: Internal Medicine

## 2021-11-26 MED ORDER — OMEPRAZOLE 20 MG PO CPDR: 20.0000 mg | DELAYED_RELEASE_CAPSULE | Freq: Every day | ORAL | 3 refills | Status: DC

## 2021-11-26 MED ORDER — DONEPEZIL HCL 10 MG PO TABS: 10.0000 mg | ORAL_TABLET | Freq: Every day | ORAL | 1 refills | Status: DC

## 2021-11-26 MED ORDER — LEVOTHYROXINE SODIUM 50 MCG PO TABS: 50.0000 ug | ORAL_TABLET | Freq: Every day | ORAL | 0 refills | Status: DC

## 2021-11-26 MED ORDER — NITROGLYCERIN 0.4 MG SL SUBL: 0.4000 mg | SUBLINGUAL_TABLET | SUBLINGUAL | 3 refills | Status: DC | PRN

## 2021-11-26 MED ORDER — ATORVASTATIN CALCIUM 40 MG PO TABS: 40.0000 mg | ORAL_TABLET | Freq: Every day | ORAL | 3 refills | Status: DC

## 2021-11-26 NOTE — Telephone Encounter (Signed)
Patient wife called in to change her and husband Pharmacy again to  [facility] # [phone]

## 2021-11-26 NOTE — Telephone Encounter (Signed)
Pharmacy has been updated.

## 2021-11-26 NOTE — Telephone Encounter (Signed)
Joelene Millin from [facility] called in regards to pts medications. They are needing new prescriptions for this medications due to pt switching pharmacies.   omeprazole (PRILOSEC) 20 MG capsule donepezil (ARICEPT) 10 MG tablet nitroGLYCERIN (NITROSTAT) 0.4 MG SL tablet     They were saying a new prescription for  levothyroxine (SYNTHROID) 50 MCG tablet however I do not see this on his current med list.

## 2021-11-26 NOTE — Addendum Note (Signed)
Addended by: Adair Laundry on: 11/26/2021 03:30 PM   Modules accepted: Orders

## 2021-11-26 NOTE — Telephone Encounter (Signed)
Rx's have been sent to new pharmacy.

## 2021-11-26 NOTE — Telephone Encounter (Signed)
Patient called in to change her and husband Pharmacy again to  [facility] phone # [phone]  and Patient need a refill for atorvastatin 40MG 

## 2021-11-26 NOTE — Telephone Encounter (Signed)
Pharmacy changed and medication refilled.

## 2021-12-14 ENCOUNTER — Other Ambulatory Visit: Payer: Self-pay

## 2021-12-14 MED ORDER — DONEPEZIL HCL 10 MG PO TABS: 10.0000 mg | ORAL_TABLET | Freq: Every day | ORAL | 1 refills | Status: DC

## 2021-12-14 MED ORDER — NITROGLYCERIN 0.4 MG SL SUBL: 0.4000 mg | SUBLINGUAL_TABLET | SUBLINGUAL | 3 refills | Status: AC | PRN

## 2021-12-14 MED ORDER — OMEPRAZOLE 20 MG PO CPDR: 20.0000 mg | DELAYED_RELEASE_CAPSULE | Freq: Every day | ORAL | 3 refills | Status: DC

## 2021-12-14 MED ORDER — LEVOTHYROXINE SODIUM 50 MCG PO TABS: 50.0000 ug | ORAL_TABLET | Freq: Every day | ORAL | 0 refills | Status: DC

## 2021-12-14 MED ORDER — ATORVASTATIN CALCIUM 40 MG PO TABS: 40.0000 mg | ORAL_TABLET | Freq: Every day | ORAL | 3 refills | Status: DC

## 2021-12-14 NOTE — Progress Notes (Signed)
Patient changed pharmacies and the medications were sent to [facility].  John Burns,cma

## 2021-12-14 NOTE — Telephone Encounter (Signed)
I called and spoke with the patients daughter and they wanted to change the pharmacy to [facility] because it is closer to her home so I sent the medications.  Makyla Bye,cma

## 2021-12-14 NOTE — Telephone Encounter (Signed)
Pt daughter called stating pt need refill on omeprazole and levothyroxine sent to [facility] on [address] in [place]

## 2021-12-15 ENCOUNTER — Other Ambulatory Visit: Payer: Self-pay

## 2021-12-15 ENCOUNTER — Ambulatory Visit (INDEPENDENT_AMBULATORY_CARE_PROVIDER_SITE_OTHER): Payer: Medicare Other | Admitting: *Deleted

## 2021-12-15 DIAGNOSIS — E538 Deficiency of other specified B group vitamins: Secondary | ICD-10-CM

## 2021-12-15 MED ORDER — CYANOCOBALAMIN 1000 MCG/ML IJ SOLN
1000.0000 ug | Freq: Once | INTRAMUSCULAR | Status: AC
  Administered 2021-12-15: 17:00:00 1000 ug via INTRAMUSCULAR

## 2021-12-15 NOTE — Progress Notes (Signed)
Patient presented for B 12 injection to left deltoid, patient voiced no concerns nor showed any signs of distress during injection. 

## 2021-12-30 DIAGNOSIS — J449 Chronic obstructive pulmonary disease, unspecified: Secondary | ICD-10-CM | POA: Diagnosis not present

## 2021-12-30 DIAGNOSIS — Z9889 Other specified postprocedural states: Secondary | ICD-10-CM | POA: Diagnosis not present

## 2021-12-30 DIAGNOSIS — I2510 Atherosclerotic heart disease of native coronary artery without angina pectoris: Secondary | ICD-10-CM | POA: Diagnosis not present

## 2021-12-30 DIAGNOSIS — I25708 Atherosclerosis of coronary artery bypass graft(s), unspecified, with other forms of angina pectoris: Secondary | ICD-10-CM | POA: Diagnosis not present

## 2021-12-30 DIAGNOSIS — Z951 Presence of aortocoronary bypass graft: Secondary | ICD-10-CM | POA: Diagnosis not present

## 2021-12-30 DIAGNOSIS — E785 Hyperlipidemia, unspecified: Secondary | ICD-10-CM | POA: Diagnosis not present

## 2021-12-30 DIAGNOSIS — R0602 Shortness of breath: Secondary | ICD-10-CM | POA: Diagnosis not present

## 2021-12-30 DIAGNOSIS — R001 Bradycardia, unspecified: Secondary | ICD-10-CM | POA: Diagnosis not present

## 2021-12-30 DIAGNOSIS — I471 Supraventricular tachycardia: Secondary | ICD-10-CM | POA: Diagnosis not present

## 2022-01-01 ENCOUNTER — Other Ambulatory Visit: Payer: Self-pay

## 2022-01-01 ENCOUNTER — Telehealth: Payer: Self-pay | Admitting: Internal Medicine

## 2022-01-01 ENCOUNTER — Encounter: Payer: Self-pay | Admitting: Internal Medicine

## 2022-01-01 ENCOUNTER — Ambulatory Visit (INDEPENDENT_AMBULATORY_CARE_PROVIDER_SITE_OTHER): Payer: Medicare Other | Admitting: Internal Medicine

## 2022-01-01 VITALS — BP 120/74 | HR 47 | Temp 97.5°F | Ht 67.0 in | Wt 156.0 lb

## 2022-01-01 DIAGNOSIS — R001 Bradycardia, unspecified: Secondary | ICD-10-CM

## 2022-01-01 DIAGNOSIS — M48061 Spinal stenosis, lumbar region without neurogenic claudication: Secondary | ICD-10-CM | POA: Diagnosis not present

## 2022-01-01 DIAGNOSIS — R5383 Other fatigue: Secondary | ICD-10-CM | POA: Diagnosis not present

## 2022-01-01 DIAGNOSIS — I10 Essential (primary) hypertension: Secondary | ICD-10-CM

## 2022-01-01 DIAGNOSIS — R7303 Prediabetes: Secondary | ICD-10-CM

## 2022-01-01 DIAGNOSIS — M5416 Radiculopathy, lumbar region: Secondary | ICD-10-CM | POA: Diagnosis not present

## 2022-01-01 DIAGNOSIS — E785 Hyperlipidemia, unspecified: Secondary | ICD-10-CM

## 2022-01-01 DIAGNOSIS — E039 Hypothyroidism, unspecified: Secondary | ICD-10-CM

## 2022-01-01 DIAGNOSIS — F01A4 Vascular dementia, mild, with anxiety: Secondary | ICD-10-CM

## 2022-01-01 DIAGNOSIS — I25810 Atherosclerosis of coronary artery bypass graft(s) without angina pectoris: Secondary | ICD-10-CM | POA: Diagnosis not present

## 2022-01-01 LAB — COMPREHENSIVE METABOLIC PANEL
ALT: 10 U/L (ref 0–53)
AST: 15 U/L (ref 0–37)
Albumin: 3.7 g/dL (ref 3.5–5.2)
Alkaline Phosphatase: 94 U/L (ref 39–117)
BUN: 16 mg/dL (ref 6–23)
CO2: 29 mEq/L (ref 19–32)
Calcium: 9.3 mg/dL (ref 8.4–10.5)
Chloride: 106 mEq/L (ref 96–112)
Creatinine, Ser: 1.11 mg/dL (ref 0.40–1.50)
GFR: 62.44 mL/min (ref >60.00)
Glucose, Bld: 86 mg/dL (ref 70–99)
Potassium: 4.2 mEq/L (ref 3.5–5.1)
Sodium: 141 mEq/L (ref 135–145)
Total Bilirubin: 0.7 mg/dL (ref 0.2–1.2)
Total Protein: 6 g/dL (ref 6.0–8.3)

## 2022-01-01 LAB — LIPID PANEL
Cholesterol: 115 mg/dL (ref 0–200)
HDL: 44.3 mg/dL (ref >39.00)
LDL Cholesterol: 60 mg/dL (ref 0–99)
NonHDL: 70.72
Total CHOL/HDL Ratio: 3
Triglycerides: 52 mg/dL (ref 0.0–149.0)
VLDL: 10.4 mg/dL (ref 0.0–40.0)

## 2022-01-01 LAB — CBC WITH DIFFERENTIAL/PLATELET
Basophils Absolute: 0 10*3/uL (ref 0.0–0.1)
Basophils Relative: 0.6 % (ref 0.0–3.0)
Eosinophils Absolute: 0.2 10*3/uL (ref 0.0–0.7)
Eosinophils Relative: 2.3 % (ref 0.0–5.0)
HCT: 41.6 % (ref 39.0–52.0)
Hemoglobin: 13.8 g/dL (ref 13.0–17.0)
Lymphocytes Relative: 13.5 % (ref 12.0–46.0)
Lymphs Abs: 0.9 10*3/uL (ref 0.7–4.0)
MCHC: 33.1 g/dL (ref 30.0–36.0)
MCV: 95.4 fl (ref 78.0–100.0)
Monocytes Absolute: 0.6 10*3/uL (ref 0.1–1.0)
Monocytes Relative: 8.6 % (ref 3.0–12.0)
Neutro Abs: 4.9 10*3/uL (ref 1.4–7.7)
Neutrophils Relative %: 75 % (ref 43.0–77.0)
Platelets: 174 10*3/uL (ref 150.0–400.0)
RBC: 4.35 Mil/uL (ref 4.22–5.81)
RDW: 13.3 % (ref 11.5–15.5)
WBC: 6.5 10*3/uL (ref 4.0–10.5)

## 2022-01-01 LAB — HEMOGLOBIN A1C: Hgb A1c MFr Bld: 5.9 % (ref 4.6–6.5)

## 2022-01-01 LAB — TSH: TSH: 2.34 u[IU]/mL (ref 0.35–5.50)

## 2022-01-01 LAB — MICROALBUMIN / CREATININE URINE RATIO
Creatinine,U: 209.5 mg/dL
Microalb Creat Ratio: 0.5 mg/g (ref 0.0–30.0)
Microalb, Ur: 1 mg/dL (ref 0.0–1.9)

## 2022-01-01 MED ORDER — METOPROLOL TARTRATE 25 MG PO TABS: 25.0000 mg | ORAL_TABLET | Freq: Every day | ORAL | 1 refills | Status: DC

## 2022-01-01 MED ORDER — TELMISARTAN 80 MG PO TABS: 80.0000 mg | ORAL_TABLET | Freq: Every day | ORAL | 1 refills | Status: DC

## 2022-01-01 NOTE — Patient Instructions (Signed)
You are taking your metoprolol correctly:  once daily   Continue telmisartan 80 mg once daily as well  You are doing well..  i'll see you in 6 months   If your home BP readings are constantly > 150/80,let me know

## 2022-01-01 NOTE — Addendum Note (Signed)
Addended by: Adair Laundry on: 01/01/2022 10:15 AM   Modules accepted: Orders

## 2022-01-01 NOTE — Telephone Encounter (Signed)
Rx's have been refilled. 

## 2022-01-01 NOTE — Telephone Encounter (Signed)
Patient was seen in office today 2/10 and had medications refilled. Patient would like medications sent to [facility] on the [address]    telmisartan (MICARDIS) 80 MG tablet metoprolol tartrate (LOPRESSOR) 25 MG tablet

## 2022-01-01 NOTE — Progress Notes (Signed)
Subjective:  Patient ID: John Burns, male    DOB: 24-Oct-1941  Age: 81 y.o. MRN: [id number]  CC: The primary encounter diagnosis was Other fatigue. Diagnoses of Essential hypertension, Prediabetes, Hypothyroidism (acquired), Hyperlipidemia, unspecified hyperlipidemia type, Bradycardia with 41-50 beats per minute, Mild vascular dementia with anxiety, Coronary artery disease involving coronary bypass graft of native heart without angina pectoris, and Spinal stenosis of lumbar region with radiculopathy were also pertinent to this visit.   This visit occurred during the SARS-CoV-2 public health emergency.  Safety protocols were in place, including screening questions prior to the visit, additional usage of staff PPE, and extensive cleaning of exam room while observing appropriate contact time as indicated for disinfecting solutions.    HPI KAYDENN MCLEAR presents for  Chief Complaint  Patient presents with   Follow-up    6 mo follow up for Hypothyroid and prediabetes   Ravinder is an 81 yr old male with hypertension, hyperlipidemia and vascular dementia,  mild.  He is accompanied by his wife .   Feeling good.  Denies insomnia . Appetite small but has gained 6 lbs since December.  Wife had knee replacement one month ago, family  has been bringing in food.   Denies constipation and diarrhea   Wife still reports that his mood is irritable and he is easily frustrated.  No sundowning or agitated behavior.   Outpatient Medications Prior to Visit  Medication Sig Dispense Refill   aspirin 81 MG tablet Take 1 tablet (81 mg total) by mouth daily. 90 tablet 1   atorvastatin (LIPITOR) 40 MG tablet Take 1 tablet (40 mg total) by mouth daily. 90 tablet 3   Calcium Carb-Cholecalciferol (CALCIUM-VITAMIN D) 500-200 MG-UNIT tablet Take 1 tablet by mouth daily.     donepezil (ARICEPT) 10 MG tablet Take 1 tablet (10 mg total) by mouth at bedtime. 90 tablet 1   levothyroxine (EUTHYROX) 50 MCG tablet Take 1  tablet (50 mcg total) by mouth daily. 90 tablet 0   nitroGLYCERIN (NITROSTAT) 0.4 MG SL tablet Place 1 tablet (0.4 mg total) under the tongue every 5 (five) minutes as needed for chest pain. 50 tablet 3   omeprazole (PRILOSEC) 20 MG capsule Take 1 capsule (20 mg total) by mouth daily. 90 capsule 3   vitamin E 400 UNIT capsule Take 400 Units by mouth daily.     metoprolol tartrate (LOPRESSOR) 25 MG tablet Take 1 tablet (25 mg total) by mouth 2 (two) times daily. 60 tablet 11   telmisartan (MICARDIS) 40 MG tablet TAKE 1 TABLET BY MOUTH AT BEDTIME (Patient not taking: Reported on 10/12/2021) 90 tablet 0   telmisartan (MICARDIS) 80 MG tablet Take 1 tablet (80 mg total) by mouth daily. (Patient not taking: Reported on 01/01/2022) 90 tablet 1   No facility-administered medications prior to visit.    Review of Systems;  Patient denies headache, fevers, malaise, unintentional weight loss, skin rash, eye pain, sinus congestion and sinus pain, sore throat, dysphagia,  hemoptysis , cough, dyspnea, wheezing, chest pain, palpitations, orthopnea, edema, abdominal pain, nausea, melena, diarrhea, constipation, flank pain, dysuria, hematuria, urinary  Frequency, nocturia, numbness, tingling, seizures,  Focal weakness, Loss of consciousness,  Tremor, insomnia, depression, anxiety, and suicidal ideation.      Objective:  BP 120/74 (BP Location: Left Arm, Patient Position: Sitting, Cuff Size: Small)    Pulse (!) 47    Temp (!) 97.5 F (36.4 C) (Oral)    Ht 5' 7" (1.702 m)  Wt 156 lb (70.8 kg)    SpO2 99%    BMI 24.43 kg/m   BP Readings from Last 3 Encounters:  01/01/22 120/74  10/12/21 124/68  07/01/21 (!) 146/72    Wt Readings from Last 3 Encounters:  01/01/22 156 lb (70.8 kg)  11/13/21 150 lb (68 kg)  10/12/21 159 lb 3.2 oz (72.2 kg)    General appearance: alert, cooperative and appears stated age Ears: normal TM's and external ear canals both ears Throat: lips, mucosa, and tongue normal; teeth  and gums normal Neck: no adenopathy, no carotid bruit, supple, symmetrical, trachea midline and thyroid not enlarged, symmetric, no tenderness/mass/nodules Back: symmetric, no curvature. ROM normal. No CVA tenderness. Lungs: clear to auscultation bilaterally Heart: regular rate and rhythm, S1, S2 normal, no murmur, click, rub or gallop Abdomen: soft, non-tender; bowel sounds normal; no masses,  no organomegaly Pulses: 2+ and symmetric Skin: Skin color, texture, turgor normal. No rashes or lesions Lymph nodes: Cervical, supraclavicular, and axillary nodes normal.  Lab Results  Component Value Date   HGBA1C 5.9 01/01/2022   HGBA1C 5.7 07/01/2021   HGBA1C 5.8 07/09/2020    Lab Results  Component Value Date   CREATININE 1.11 01/01/2022   CREATININE 1.13 07/01/2021   CREATININE 1.00 12/26/2020    Lab Results  Component Value Date   WBC 6.5 01/01/2022   HGB 13.8 01/01/2022   HCT 41.6 01/01/2022   PLT 174.0 01/01/2022   GLUCOSE 86 01/01/2022   CHOL 115 01/01/2022   TRIG 52.0 01/01/2022   HDL 44.30 01/01/2022   LDLCALC 60 01/01/2022   ALT 10 01/01/2022   AST 15 01/01/2022   NA 141 01/01/2022   K 4.2 01/01/2022   CL 106 01/01/2022   CREATININE 1.11 01/01/2022   BUN 16 01/01/2022   CO2 29 01/01/2022   TSH 2.34 01/01/2022   PSA 2.33 05/22/2015   HGBA1C 5.9 01/01/2022   MICROALBUR 1.0 01/01/2022     Assessment & Plan:   Problem List Items Addressed This Visit     Bradycardia with 41-50 beats per minute    Resting pulse has improved since  Stopping diltiazem.  Continue 25 mg metoprolol daily      CAD (coronary artery disease) of artery bypass graft    He is asymptomatic and taking the appropriate medications.  LDL is at goal.  He has had his annual follow up with Dr Saralyn Pilar and no medication changes were made.  l Lab Results  Component Value Date   CHOL 115 01/01/2022   HDL 44.30 01/01/2022   LDLCALC 60 01/01/2022   TRIG 52.0 01/01/2022   CHOLHDL 3 01/01/2022          Essential hypertension    Home readings have improved with  Use of metoprolol 25 mg  Once daily and telmisartan 80 mg daily       Relevant Orders   Comp Met (CMET) (Completed)   Urine Microalbumin w/creat. ratio (Completed)   Hypothyroidism (acquired)    Thyroid function is WNL on current dose of levothyroxine 50 mcg daily .  No current changes needed.   Lab Results  Component Value Date   TSH 2.34 01/01/2022         Relevant Orders   TSH (Completed)   Spinal stenosis of lumbar region with radiculopathy    His chronic pain has resolved since his SI x 3 by [place],  Last one in May 2021. Recommend use of 1000 mg tylenol q 12 hours prn  Vascular dementia ([place])    His symptoms have not progressed , and he is tolerating aricept        Other Visit Diagnoses     Other fatigue    -  Primary   Relevant Orders   CBC with Differential/Platelet (Completed)   Prediabetes       Relevant Orders   HgB A1c (Completed)   Urine Microalbumin w/creat. ratio (Completed)   Hyperlipidemia, unspecified hyperlipidemia type       Relevant Orders   Lipid Profile (Completed)       I spent 30 minutes dedicated to the care of this patient on the date of this encounter to include pre-visit review of patient's medical history,  most recent imaging studies, Face-to-face time with the patient , and post visit ordering of testing and therapeutics.    Follow-up: No follow-ups on file.   Crecencio Mc, MD

## 2022-01-03 NOTE — Assessment & Plan Note (Signed)
His symptoms have not progressed , and he is tolerating aricept

## 2022-01-03 NOTE — Assessment & Plan Note (Signed)
Resting pulse has improved since  Stopping diltiazem.  Continue 25 mg metoprolol daily

## 2022-01-03 NOTE — Assessment & Plan Note (Signed)
Home readings have improved with  Use of metoprolol 25 mg  Once daily and telmisartan 80 mg daily

## 2022-01-03 NOTE — Assessment & Plan Note (Signed)
His chronic pain has resolved since his SI x 3 by [facility],  Last one in May 2021. Recommend use of 1000 mg tylenol q 12 hours prn

## 2022-01-03 NOTE — Assessment & Plan Note (Signed)
Thyroid function is WNL on current dose of levothyroxine 50 mcg daily .  No current changes needed.   Lab Results  Component Value Date   TSH 2.34 01/01/2022

## 2022-01-03 NOTE — Assessment & Plan Note (Signed)
He is asymptomatic and taking the appropriate medications.  LDL is at goal.  He has had his annual follow up with Dr Saralyn Pilar and no medication changes were made.  l Lab Results  Component Value Date   CHOL 115 01/01/2022   HDL 44.30 01/01/2022   LDLCALC 60 01/01/2022   TRIG 52.0 01/01/2022   CHOLHDL 3 01/01/2022

## 2022-01-18 ENCOUNTER — Ambulatory Visit (INDEPENDENT_AMBULATORY_CARE_PROVIDER_SITE_OTHER): Payer: Medicare Other | Admitting: *Deleted

## 2022-01-18 ENCOUNTER — Other Ambulatory Visit: Payer: Self-pay

## 2022-01-18 DIAGNOSIS — E538 Deficiency of other specified B group vitamins: Secondary | ICD-10-CM

## 2022-01-18 MED ORDER — CYANOCOBALAMIN 1000 MCG/ML IJ SOLN
1000.0000 ug | Freq: Once | INTRAMUSCULAR | Status: AC
  Administered 2022-01-18: 1000 ug via INTRAMUSCULAR

## 2022-01-18 NOTE — Progress Notes (Signed)
Pt arrived for B12 injection, given in R deltoid. Pt tolerated injection well, showed no signs of distress nor voiced any concerns.  

## 2022-02-01 ENCOUNTER — Telehealth: Payer: Self-pay | Admitting: Internal Medicine

## 2022-02-01 DIAGNOSIS — I10 Essential (primary) hypertension: Secondary | ICD-10-CM

## 2022-02-01 NOTE — Assessment & Plan Note (Signed)
Home readings for the period of 2/14 to 01/19/2022 are for  the most part  < 140/90.  No changes to regimen were made ? ?

## 2022-02-01 NOTE — Telephone Encounter (Signed)
Home BP readings for Feb 2023 reviewed.  Message sent via mychart  no changes ? ?

## 2022-02-15 ENCOUNTER — Ambulatory Visit: Payer: Medicare Other

## 2022-02-24 ENCOUNTER — Ambulatory Visit: Payer: Medicare Other

## 2022-02-24 ENCOUNTER — Ambulatory Visit (INDEPENDENT_AMBULATORY_CARE_PROVIDER_SITE_OTHER): Payer: Medicare Other | Admitting: *Deleted

## 2022-02-24 DIAGNOSIS — E538 Deficiency of other specified B group vitamins: Secondary | ICD-10-CM | POA: Diagnosis not present

## 2022-02-24 MED ORDER — CYANOCOBALAMIN 1000 MCG/ML IJ SOLN
1000.0000 ug | Freq: Once | INTRAMUSCULAR | Status: AC
  Administered 2022-02-24: 1000 ug via INTRAMUSCULAR

## 2022-02-24 NOTE — Progress Notes (Signed)
Pt arrived for B12 injection. Pt tolerated well. No signs or complaints of distress. ?

## 2022-03-12 DIAGNOSIS — M5126 Other intervertebral disc displacement, lumbar region: Secondary | ICD-10-CM | POA: Diagnosis not present

## 2022-03-12 DIAGNOSIS — M5416 Radiculopathy, lumbar region: Secondary | ICD-10-CM | POA: Diagnosis not present

## 2022-03-23 ENCOUNTER — Telehealth: Payer: Self-pay | Admitting: Internal Medicine

## 2022-03-23 MED ORDER — LEVOTHYROXINE SODIUM 50 MCG PO TABS: 50.0000 ug | ORAL_TABLET | Freq: Every day | ORAL | 0 refills | Status: DC

## 2022-03-23 NOTE — Telephone Encounter (Signed)
Patient is requesting a refill for levothyroxine (EUTHYROX) 50 MCG tablet. Please send to [facility] next to [address] in [place]. ?

## 2022-03-23 NOTE — Telephone Encounter (Signed)
Medication has been refilled.

## 2022-03-23 NOTE — Addendum Note (Signed)
Addended by: Adair Laundry on: 03/23/2022 01:28 PM ? ? Modules accepted: Orders ? ?

## 2022-03-26 ENCOUNTER — Ambulatory Visit (INDEPENDENT_AMBULATORY_CARE_PROVIDER_SITE_OTHER): Payer: Medicare Other

## 2022-03-26 DIAGNOSIS — E538 Deficiency of other specified B group vitamins: Secondary | ICD-10-CM

## 2022-03-26 MED ORDER — CYANOCOBALAMIN 1000 MCG/ML IJ SOLN
1000.0000 ug | Freq: Once | INTRAMUSCULAR | Status: AC
  Administered 2022-03-26: 1000 ug via INTRAMUSCULAR

## 2022-03-26 NOTE — Progress Notes (Signed)
Patient presented for B 12 injection to left deltoid, patient voiced no concerns nor showed any signs of distress during injection. 

## 2022-04-12 DIAGNOSIS — M5416 Radiculopathy, lumbar region: Secondary | ICD-10-CM | POA: Diagnosis not present

## 2022-04-12 DIAGNOSIS — M48062 Spinal stenosis, lumbar region with neurogenic claudication: Secondary | ICD-10-CM | POA: Diagnosis not present

## 2022-04-12 DIAGNOSIS — M5126 Other intervertebral disc displacement, lumbar region: Secondary | ICD-10-CM | POA: Diagnosis not present

## 2022-04-12 DIAGNOSIS — M62830 Muscle spasm of back: Secondary | ICD-10-CM | POA: Diagnosis not present

## 2022-04-27 ENCOUNTER — Ambulatory Visit (INDEPENDENT_AMBULATORY_CARE_PROVIDER_SITE_OTHER): Payer: Medicare Other

## 2022-04-27 ENCOUNTER — Ambulatory Visit: Payer: Medicare Other

## 2022-04-27 DIAGNOSIS — I2510 Atherosclerotic heart disease of native coronary artery without angina pectoris: Secondary | ICD-10-CM | POA: Diagnosis not present

## 2022-04-27 DIAGNOSIS — Z9889 Other specified postprocedural states: Secondary | ICD-10-CM | POA: Diagnosis not present

## 2022-04-27 DIAGNOSIS — E538 Deficiency of other specified B group vitamins: Secondary | ICD-10-CM

## 2022-04-27 DIAGNOSIS — I471 Supraventricular tachycardia: Secondary | ICD-10-CM | POA: Diagnosis not present

## 2022-04-27 DIAGNOSIS — Z951 Presence of aortocoronary bypass graft: Secondary | ICD-10-CM | POA: Diagnosis not present

## 2022-04-27 DIAGNOSIS — Z87891 Personal history of nicotine dependence: Secondary | ICD-10-CM | POA: Diagnosis not present

## 2022-04-27 DIAGNOSIS — J449 Chronic obstructive pulmonary disease, unspecified: Secondary | ICD-10-CM | POA: Diagnosis not present

## 2022-04-27 DIAGNOSIS — I25708 Atherosclerosis of coronary artery bypass graft(s), unspecified, with other forms of angina pectoris: Secondary | ICD-10-CM | POA: Diagnosis not present

## 2022-04-27 DIAGNOSIS — E785 Hyperlipidemia, unspecified: Secondary | ICD-10-CM | POA: Diagnosis not present

## 2022-04-27 DIAGNOSIS — R001 Bradycardia, unspecified: Secondary | ICD-10-CM | POA: Diagnosis not present

## 2022-04-27 MED ORDER — CYANOCOBALAMIN 1000 MCG/ML IJ SOLN
1000.0000 ug | Freq: Once | INTRAMUSCULAR | Status: AC
  Administered 2022-04-27: 1000 ug via INTRAMUSCULAR

## 2022-04-27 NOTE — Progress Notes (Signed)
Patient presented for B 12 injection to left deltoid, patient voiced no concerns nor showed any signs of distress during injection. 

## 2022-05-10 DIAGNOSIS — M48062 Spinal stenosis, lumbar region with neurogenic claudication: Secondary | ICD-10-CM | POA: Diagnosis not present

## 2022-05-10 DIAGNOSIS — M5416 Radiculopathy, lumbar region: Secondary | ICD-10-CM | POA: Diagnosis not present

## 2022-05-10 DIAGNOSIS — M5126 Other intervertebral disc displacement, lumbar region: Secondary | ICD-10-CM | POA: Diagnosis not present

## 2022-05-10 DIAGNOSIS — M62830 Muscle spasm of back: Secondary | ICD-10-CM | POA: Diagnosis not present

## 2022-05-18 DIAGNOSIS — Z951 Presence of aortocoronary bypass graft: Secondary | ICD-10-CM | POA: Diagnosis not present

## 2022-05-18 DIAGNOSIS — I2510 Atherosclerotic heart disease of native coronary artery without angina pectoris: Secondary | ICD-10-CM | POA: Diagnosis not present

## 2022-05-26 DIAGNOSIS — E785 Hyperlipidemia, unspecified: Secondary | ICD-10-CM | POA: Diagnosis not present

## 2022-05-26 DIAGNOSIS — R0602 Shortness of breath: Secondary | ICD-10-CM | POA: Diagnosis not present

## 2022-05-26 DIAGNOSIS — R079 Chest pain, unspecified: Secondary | ICD-10-CM | POA: Diagnosis not present

## 2022-05-26 DIAGNOSIS — R001 Bradycardia, unspecified: Secondary | ICD-10-CM | POA: Diagnosis not present

## 2022-05-26 DIAGNOSIS — Z951 Presence of aortocoronary bypass graft: Secondary | ICD-10-CM | POA: Diagnosis not present

## 2022-05-26 DIAGNOSIS — I471 Supraventricular tachycardia: Secondary | ICD-10-CM | POA: Diagnosis not present

## 2022-05-26 DIAGNOSIS — J449 Chronic obstructive pulmonary disease, unspecified: Secondary | ICD-10-CM | POA: Diagnosis not present

## 2022-05-26 DIAGNOSIS — I25708 Atherosclerosis of coronary artery bypass graft(s), unspecified, with other forms of angina pectoris: Secondary | ICD-10-CM | POA: Diagnosis not present

## 2022-05-26 DIAGNOSIS — I2510 Atherosclerotic heart disease of native coronary artery without angina pectoris: Secondary | ICD-10-CM | POA: Diagnosis not present

## 2022-05-26 DIAGNOSIS — Z9889 Other specified postprocedural states: Secondary | ICD-10-CM | POA: Diagnosis not present

## 2022-05-27 ENCOUNTER — Ambulatory Visit: Payer: Medicare Other

## 2022-05-27 ENCOUNTER — Telehealth: Payer: Self-pay

## 2022-05-27 DIAGNOSIS — E538 Deficiency of other specified B group vitamins: Secondary | ICD-10-CM

## 2022-05-27 MED ORDER — CYANOCOBALAMIN 1000 MCG/ML IJ SOLN
1000.0000 ug | Freq: Once | INTRAMUSCULAR | Status: AC
  Administered 2022-12-06: 1000 ug via INTRAMUSCULAR

## 2022-05-27 NOTE — Telephone Encounter (Signed)
Noted  

## 2022-05-27 NOTE — Progress Notes (Signed)
Patient presented for B 12 injection to left deltoid, patient voiced no concerns nor showed any signs of distress during injection. 

## 2022-05-27 NOTE — Telephone Encounter (Signed)
Epic was down during check-out for appointment on 05/27/2022.  I left a voicemail message for patient asking him to please call us back to schedule his next injection.  I did call patient's mobile number, there was no answer, and voicemail has not been set up yet.

## 2022-06-07 ENCOUNTER — Other Ambulatory Visit: Payer: Self-pay | Admitting: Physical Medicine and Rehabilitation

## 2022-06-07 DIAGNOSIS — M62830 Muscle spasm of back: Secondary | ICD-10-CM | POA: Diagnosis not present

## 2022-06-07 DIAGNOSIS — M5416 Radiculopathy, lumbar region: Secondary | ICD-10-CM

## 2022-06-07 DIAGNOSIS — M5126 Other intervertebral disc displacement, lumbar region: Secondary | ICD-10-CM | POA: Diagnosis not present

## 2022-06-07 DIAGNOSIS — M48062 Spinal stenosis, lumbar region with neurogenic claudication: Secondary | ICD-10-CM | POA: Diagnosis not present

## 2022-06-14 ENCOUNTER — Ambulatory Visit
Admission: RE | Admit: 2022-06-14 | Discharge: 2022-06-14 | Disposition: A | Discharge status: Home / Self Care | Payer: Medicare Other | Source: Ambulatory Visit | Attending: Physical Medicine and Rehabilitation | Admitting: Physical Medicine and Rehabilitation

## 2022-06-14 ENCOUNTER — Other Ambulatory Visit: Payer: Self-pay | Admitting: Internal Medicine

## 2022-06-14 ENCOUNTER — Telehealth: Payer: Self-pay

## 2022-06-14 DIAGNOSIS — M5416 Radiculopathy, lumbar region: Secondary | ICD-10-CM

## 2022-06-14 DIAGNOSIS — M4316 Spondylolisthesis, lumbar region: Secondary | ICD-10-CM | POA: Diagnosis not present

## 2022-06-14 DIAGNOSIS — M48061 Spinal stenosis, lumbar region without neurogenic claudication: Secondary | ICD-10-CM | POA: Diagnosis not present

## 2022-06-14 DIAGNOSIS — M545 Low back pain, unspecified: Secondary | ICD-10-CM | POA: Diagnosis not present

## 2022-06-14 MED ORDER — DONEPEZIL HCL 10 MG PO TABS: 10.0000 mg | ORAL_TABLET | Freq: Every day | ORAL | 1 refills | Status: DC

## 2022-06-14 MED ORDER — LEVOTHYROXINE SODIUM 50 MCG PO TABS: 50.0000 ug | ORAL_TABLET | Freq: Every day | ORAL | 1 refills | Status: DC

## 2022-06-14 NOTE — Telephone Encounter (Signed)
Medication has been refilled.

## 2022-06-14 NOTE — Telephone Encounter (Signed)
Patient's wife, Houa Ackert, called to request a refill for the following medications:  levothyroxine (EUTHYROX) 50 MCG tablet  donepezil (ARICEPT) 10 MG tablet  [name] states patient's preferred pharmacy is [facility] on [address] in [place].  [name] states she would like to pick the medication up today if possible since they will be coming to [facility] for an appointment at 11am.

## 2022-06-17 DIAGNOSIS — L578 Other skin changes due to chronic exposure to nonionizing radiation: Secondary | ICD-10-CM | POA: Diagnosis not present

## 2022-06-17 DIAGNOSIS — L821 Other seborrheic keratosis: Secondary | ICD-10-CM | POA: Diagnosis not present

## 2022-06-17 DIAGNOSIS — D485 Neoplasm of uncertain behavior of skin: Secondary | ICD-10-CM | POA: Diagnosis not present

## 2022-06-17 DIAGNOSIS — Z872 Personal history of diseases of the skin and subcutaneous tissue: Secondary | ICD-10-CM | POA: Diagnosis not present

## 2022-06-29 ENCOUNTER — Ambulatory Visit (INDEPENDENT_AMBULATORY_CARE_PROVIDER_SITE_OTHER): Payer: Medicare Other

## 2022-06-29 DIAGNOSIS — E538 Deficiency of other specified B group vitamins: Secondary | ICD-10-CM | POA: Diagnosis not present

## 2022-06-29 MED ORDER — CYANOCOBALAMIN 1000 MCG/ML IJ SOLN
1000.0000 ug | Freq: Once | INTRAMUSCULAR | Status: AC
  Administered 2022-06-29: 1000 ug via INTRAMUSCULAR

## 2022-06-29 NOTE — Progress Notes (Signed)
Pt arrived for B12 injection, given in R deltoid. Pt tolerated injection well, showed no signs of distress nor voiced any concerns.  

## 2022-07-01 ENCOUNTER — Ambulatory Visit: Payer: Medicare Other | Admitting: Internal Medicine

## 2022-07-02 DIAGNOSIS — M5126 Other intervertebral disc displacement, lumbar region: Secondary | ICD-10-CM | POA: Diagnosis not present

## 2022-07-02 DIAGNOSIS — M5416 Radiculopathy, lumbar region: Secondary | ICD-10-CM | POA: Diagnosis not present

## 2022-07-09 ENCOUNTER — Ambulatory Visit (INDEPENDENT_AMBULATORY_CARE_PROVIDER_SITE_OTHER): Payer: Medicare Other | Admitting: Internal Medicine

## 2022-07-09 ENCOUNTER — Encounter: Payer: Self-pay | Admitting: Internal Medicine

## 2022-07-09 VITALS — BP 146/76 | HR 51 | Temp 97.4°F | Ht 67.0 in | Wt 154.4 lb

## 2022-07-09 DIAGNOSIS — I10 Essential (primary) hypertension: Secondary | ICD-10-CM

## 2022-07-09 DIAGNOSIS — E785 Hyperlipidemia, unspecified: Secondary | ICD-10-CM

## 2022-07-09 DIAGNOSIS — M48061 Spinal stenosis, lumbar region without neurogenic claudication: Secondary | ICD-10-CM | POA: Diagnosis not present

## 2022-07-09 DIAGNOSIS — E538 Deficiency of other specified B group vitamins: Secondary | ICD-10-CM | POA: Diagnosis not present

## 2022-07-09 DIAGNOSIS — F01A4 Vascular dementia, mild, with anxiety: Secondary | ICD-10-CM

## 2022-07-09 DIAGNOSIS — K22719 Barrett's esophagus with dysplasia, unspecified: Secondary | ICD-10-CM

## 2022-07-09 DIAGNOSIS — I25810 Atherosclerosis of coronary artery bypass graft(s) without angina pectoris: Secondary | ICD-10-CM

## 2022-07-09 DIAGNOSIS — R7303 Prediabetes: Secondary | ICD-10-CM | POA: Diagnosis not present

## 2022-07-09 DIAGNOSIS — E039 Hypothyroidism, unspecified: Secondary | ICD-10-CM | POA: Diagnosis not present

## 2022-07-09 DIAGNOSIS — M5416 Radiculopathy, lumbar region: Secondary | ICD-10-CM | POA: Diagnosis not present

## 2022-07-09 LAB — COMPREHENSIVE METABOLIC PANEL WITH GFR
ALT: 15 U/L (ref 0–53)
AST: 17 U/L (ref 0–37)
Albumin: 3.9 g/dL (ref 3.5–5.2)
Alkaline Phosphatase: 109 U/L (ref 39–117)
BUN: 18 mg/dL (ref 6–23)
CO2: 28 meq/L (ref 19–32)
Calcium: 9.5 mg/dL (ref 8.4–10.5)
Chloride: 108 meq/L (ref 96–112)
Creatinine, Ser: 1.13 mg/dL (ref 0.40–1.50)
GFR: 60.89 mL/min (ref >60.00)
Glucose, Bld: 84 mg/dL (ref 70–99)
Potassium: 4.6 meq/L (ref 3.5–5.1)
Sodium: 143 meq/L (ref 135–145)
Total Bilirubin: 0.7 mg/dL (ref 0.2–1.2)
Total Protein: 5.7 g/dL — ABNORMAL LOW (ref 6.0–8.3)

## 2022-07-09 LAB — LIPID PANEL
Cholesterol: 130 mg/dL (ref 0–200)
HDL: 49.7 mg/dL (ref >39.00)
LDL Cholesterol: 69 mg/dL (ref 0–99)
NonHDL: 80.38
Total CHOL/HDL Ratio: 3
Triglycerides: 58 mg/dL (ref 0.0–149.0)
VLDL: 11.6 mg/dL (ref 0.0–40.0)

## 2022-07-09 LAB — TSH: TSH: 3.54 u[IU]/mL (ref 0.35–5.50)

## 2022-07-09 LAB — HEMOGLOBIN A1C: Hgb A1c MFr Bld: 6.2 % (ref 4.6–6.5)

## 2022-07-09 NOTE — Progress Notes (Unsigned)
Subjective:  Patient ID: John Burns, male    DOB: December 08, 1940  Age: 81 y.o. MRN: [id number]  CC: The primary encounter diagnosis was Essential hypertension. Diagnoses of Hypothyroidism (acquired), Prediabetes, and Hyperlipidemia, unspecified hyperlipidemia type were also pertinent to this visit.   HPI John Burns presents for  Chief Complaint  Patient presents with   Follow-up    6 month follow up     1) lumbar radiculitisi;  had ESI on August 11  2) B12 deficiency receiving injections  3) HTN, hyperlipidemia   Outpatient Medications Prior to Visit  Medication Sig Dispense Refill   aspirin 81 MG tablet Take 1 tablet (81 mg total) by mouth daily. 90 tablet 1   atorvastatin (LIPITOR) 40 MG tablet Take 1 tablet (40 mg total) by mouth daily. 90 tablet 3   Calcium Carb-Cholecalciferol (CALCIUM-VITAMIN D) 500-200 MG-UNIT tablet Take 1 tablet by mouth daily.     donepezil (ARICEPT) 10 MG tablet Take 1 tablet (10 mg total) by mouth at bedtime. 90 tablet 1   levothyroxine (EUTHYROX) 50 MCG tablet Take 1 tablet (50 mcg total) by mouth daily. 90 tablet 1   nitroGLYCERIN (NITROSTAT) 0.4 MG SL tablet Place 1 tablet (0.4 mg total) under the tongue every 5 (five) minutes as needed for chest pain. 50 tablet 3   omeprazole (PRILOSEC) 20 MG capsule Take 1 capsule (20 mg total) by mouth daily. 90 capsule 3   telmisartan (MICARDIS) 80 MG tablet Take 1 tablet (80 mg total) by mouth daily. 90 tablet 1   vitamin E 400 UNIT capsule Take 400 Units by mouth daily.     metoprolol tartrate (LOPRESSOR) 25 MG tablet Take 1 tablet (25 mg total) by mouth daily. (Patient not taking: Reported on 07/09/2022) 90 tablet 1   Facility-Administered Medications Prior to Visit  Medication Dose Route Frequency Provider Last Rate Last Admin   cyanocobalamin ((VITAMIN B-12)) injection 1,000 mcg  1,000 mcg Intramuscular Once Crecencio Mc, MD        Review of Systems;  Patient denies headache, fevers, malaise,  unintentional weight loss, skin rash, eye pain, sinus congestion and sinus pain, sore throat, dysphagia,  hemoptysis , cough, dyspnea, wheezing, chest pain, palpitations, orthopnea, edema, abdominal pain, nausea, melena, diarrhea, constipation, flank pain, dysuria, hematuria, urinary  Frequency, nocturia, numbness, tingling, seizures,  Focal weakness, Loss of consciousness,  Tremor, insomnia, depression, anxiety, and suicidal ideation.      Objective:  BP (!) 146/76 (BP Location: Left Arm, Patient Position: Sitting, Cuff Size: Normal)   Pulse (!) 51   Temp (!) 97.4 F (36.3 C) (Oral)   Ht _0  (1.702 m)   Wt 154 lb 6.4 oz (70 kg)   SpO2 98%   BMI 24.18 kg/m   BP Readings from Last 3 Encounters:  07/09/22 (!) 146/76  01/01/22 120/74  10/12/21 124/68    Wt Readings from Last 3 Encounters:  07/09/22 154 lb 6.4 oz (70 kg)  01/01/22 156 lb (70.8 kg)  11/13/21 150 lb (68 kg)    General appearance: alert, cooperative and appears stated age Ears: normal TM's and external ear canals both ears Throat: lips, mucosa, and tongue normal; teeth and gums normal Neck: no adenopathy, no carotid bruit, supple, symmetrical, trachea midline and thyroid not enlarged, symmetric, no tenderness/mass/nodules Back: symmetric, no curvature. ROM normal. No CVA tenderness. Lungs: clear to auscultation bilaterally Heart: regular rate and rhythm, S1, S2 normal, no murmur, click, rub or gallop Abdomen: soft, non-tender; bowel sounds  normal; no masses,  no organomegaly Pulses: 2+ and symmetric Skin: Skin color, texture, turgor normal. No rashes or lesions Lymph nodes: Cervical, supraclavicular, and axillary nodes normal.  Lab Results  Component Value Date   HGBA1C 5.9 01/01/2022   HGBA1C 5.7 07/01/2021   HGBA1C 5.8 07/09/2020    Lab Results  Component Value Date   CREATININE 1.11 01/01/2022   CREATININE 1.13 07/01/2021   CREATININE 1.00 12/26/2020    Lab Results  Component Value Date   WBC 6.5  01/01/2022   HGB 13.8 01/01/2022   HCT 41.6 01/01/2022   PLT 174.0 01/01/2022   GLUCOSE 86 01/01/2022   CHOL 115 01/01/2022   TRIG 52.0 01/01/2022   HDL 44.30 01/01/2022   LDLCALC 60 01/01/2022   ALT 10 01/01/2022   AST 15 01/01/2022   NA 141 01/01/2022   K 4.2 01/01/2022   CL 106 01/01/2022   CREATININE 1.11 01/01/2022   BUN 16 01/01/2022   CO2 29 01/01/2022   TSH 2.34 01/01/2022   PSA 2.33 05/22/2015   HGBA1C 5.9 01/01/2022   MICROALBUR 1.0 01/01/2022    MR LUMBAR SPINE WO CONTRAST  Result Date: 06/14/2022 CLINICAL DATA:  Lumbar radiculitis. Chronic low back pain with left-sided sciatica. EXAM: MRI LUMBAR SPINE WITHOUT CONTRAST TECHNIQUE: Multiplanar, multisequence MR imaging of the lumbar spine was performed. No intravenous contrast was administered. COMPARISON:  Lumbar spine MRI 04/18/2020 FINDINGS: Segmentation:  Standard. Alignment:  Unchanged trace retrolisthesis of L2 on L3. Vertebrae: No fracture, suspicious marrow lesion, or significant marrow edema. Chronic degenerative endplate changes at U03-70, L2-3, and L5-S1. Conus medullaris and cauda equina: Conus extends to the L1-2 level. Unchanged small lipoma of the filum. Paraspinal and other soft tissues: Left renal sinus cysts. Disc levels: Disc desiccation throughout the lumbar and lower thoracic spine. Unchanged severe disc space narrowing at T11-12 and L5-S1 and moderate narrowing at L2-3. T12-L1: No disc herniation or stenosis. L1-2: Disc bulging results in mild left neural foraminal stenosis without spinal stenosis, unchanged. L2-3: Disc bulging, a chronic left subarticular disc extrusion with mild cranial migration, and mild facet hypertrophy result in severe left lateral recess and moderate left neural foraminal stenosis with potential left L2 and L3 nerve root impingement. The morphology of the disc extrusion is mildly different than in 2021, overall appearing perhaps slightly smaller but with slightly greater medial extent  in the lateral recess. The overall degree of stenosis is similar to the prior MRI. Borderline spinal stenosis. L3-4: Mild disc bulging with central and left foraminal annular fissures and mild facet hypertrophy, unchanged. No significant stenosis. L4-5: Disc bulging, a right central disc protrusion with annular fissure, and mild facet hypertrophy result in mild bilateral lateral recess stenosis and borderline spinal stenosis without neural foraminal stenosis, unchanged. L5-S1: Circumferential disc osteophyte complex and mild facet hypertrophy result in minimal bilateral lateral recess stenosis and moderate bilateral neural foraminal stenosis without spinal stenosis, unchanged. IMPRESSION: Multilevel lumbar disc and facet degeneration without significant interval change, most notable at L2-3 where a chronic disc extrusion results in left lateral recess and left neural foraminal stenosis. Electronically Signed   By: Logan Bores M.D.   On: 06/14/2022 18:25    Assessment & Plan:   Problem List Items Addressed This Visit     Essential hypertension - Primary   Relevant Orders   Comp Met (CMET)   Hypothyroidism (acquired)   Relevant Orders   TSH   Other Visit Diagnoses     Prediabetes  Relevant Orders   HgB A1c   Hyperlipidemia, unspecified hyperlipidemia type       Relevant Orders   Lipid Profile       I spent a total of   minutes with this patient in a face to face visit on the date of this encounter reviewing the last office visit with me on        ,  most recent with patient's cardiologist in    ,  patient'ss diet and eating habits, home blood pressure readings ,  most recent imaging study ,   and post visit ordering of testing and therapeutics.    Follow-up: No follow-ups on file.   Crecencio Mc, MD

## 2022-07-09 NOTE — Patient Instructions (Signed)
Please start putting a 500 mg tylenol tablet or capsule in John Burns's pill box twice daily  Check BP once a month . Let me know if readings are over 140/90

## 2022-07-09 NOTE — Assessment & Plan Note (Signed)
Advised to continue PPI for life

## 2022-07-11 NOTE — Assessment & Plan Note (Signed)
Managed with monthly b12 injections ,  Given today 

## 2022-07-11 NOTE — Assessment & Plan Note (Signed)
His symptoms have not progressed , and he is tolerating aricept  .  Strongly advised wife to supervise medications more closely.

## 2022-07-11 NOTE — Assessment & Plan Note (Addendum)
He is asymptomatic and taking atorvastatin, , ASA ad telmisartan.  Metoprolol was stopped due to persitent sinus bradycardia, symptomatic.    LDL is at goal.  He has had his annual follow up with Dr Saralyn Pilar and no medication changes were made.  l Lab Results  Component Value Date   CHOL 130 07/09/2022   HDL 49.70 07/09/2022   LDLCALC 69 07/09/2022   TRIG 58.0 07/09/2022   CHOLHDL 3 07/09/2022

## 2022-07-11 NOTE — Assessment & Plan Note (Signed)
His chronic pain is managed with periodic ESI  by [facility],  Last one in August 2023.   Recommend use of 1000 mg tylenol q 12 hours scheduled via pill box for ongoing vaious pain complaints per wife (denied by patient in office today)

## 2022-07-29 DIAGNOSIS — M48062 Spinal stenosis, lumbar region with neurogenic claudication: Secondary | ICD-10-CM | POA: Diagnosis not present

## 2022-07-29 DIAGNOSIS — M5416 Radiculopathy, lumbar region: Secondary | ICD-10-CM | POA: Diagnosis not present

## 2022-07-29 DIAGNOSIS — M5126 Other intervertebral disc displacement, lumbar region: Secondary | ICD-10-CM | POA: Diagnosis not present

## 2022-08-02 ENCOUNTER — Ambulatory Visit (INDEPENDENT_AMBULATORY_CARE_PROVIDER_SITE_OTHER): Payer: Medicare Other

## 2022-08-02 DIAGNOSIS — E538 Deficiency of other specified B group vitamins: Secondary | ICD-10-CM

## 2022-08-02 MED ORDER — CYANOCOBALAMIN 1000 MCG/ML IJ SOLN
1000.0000 ug | Freq: Once | INTRAMUSCULAR | Status: AC
  Administered 2022-08-02: 1000 ug via INTRAMUSCULAR

## 2022-08-02 NOTE — Progress Notes (Signed)
Patient presented for B 12 injection to left deltoid, patient voiced no concerns nor showed any signs of distress during injection. 

## 2022-09-02 ENCOUNTER — Ambulatory Visit (INDEPENDENT_AMBULATORY_CARE_PROVIDER_SITE_OTHER): Payer: Medicare Other

## 2022-09-02 DIAGNOSIS — E538 Deficiency of other specified B group vitamins: Secondary | ICD-10-CM | POA: Diagnosis not present

## 2022-09-02 DIAGNOSIS — Z23 Encounter for immunization: Secondary | ICD-10-CM

## 2022-09-02 MED ORDER — CYANOCOBALAMIN 1000 MCG/ML IJ SOLN
1000.0000 ug | Freq: Once | INTRAMUSCULAR | Status: AC
  Administered 2022-09-02: 1000 ug via INTRAMUSCULAR

## 2022-09-02 NOTE — Progress Notes (Signed)
presents today for injection per MD orders. B12 injection administered IM in left Upper Arm. Administration without incident. Patient tolerated well.  Noelle Sease,cma   

## 2022-09-07 ENCOUNTER — Telehealth: Payer: Self-pay | Admitting: Internal Medicine

## 2022-09-07 NOTE — Telephone Encounter (Signed)
Pt wife called stating pt forgot to take his telmisartan last night and want to see if he can take it this morning

## 2022-09-07 NOTE — Telephone Encounter (Signed)
Pt's wife would like to know if he needs to take his telmisartan this morning or wait until this evening since he forgot his dose last night.

## 2022-09-08 NOTE — Telephone Encounter (Signed)
Pt's wife is aware of message below and gave a verbal understanding.

## 2022-09-21 ENCOUNTER — Telehealth: Payer: Self-pay | Admitting: Internal Medicine

## 2022-09-21 MED ORDER — TELMISARTAN 80 MG PO TABS: 80.0000 mg | ORAL_TABLET | Freq: Every day | ORAL | 1 refills | Status: DC

## 2022-09-21 NOTE — Telephone Encounter (Signed)
Pt need a refill on [facility]

## 2022-09-21 NOTE — Telephone Encounter (Signed)
Medication has been refilled.

## 2022-09-24 DIAGNOSIS — M47816 Spondylosis without myelopathy or radiculopathy, lumbar region: Secondary | ICD-10-CM | POA: Diagnosis not present

## 2022-09-28 DIAGNOSIS — M48062 Spinal stenosis, lumbar region with neurogenic claudication: Secondary | ICD-10-CM | POA: Diagnosis not present

## 2022-09-28 DIAGNOSIS — M5126 Other intervertebral disc displacement, lumbar region: Secondary | ICD-10-CM | POA: Diagnosis not present

## 2022-09-28 DIAGNOSIS — M47816 Spondylosis without myelopathy or radiculopathy, lumbar region: Secondary | ICD-10-CM | POA: Diagnosis not present

## 2022-09-28 DIAGNOSIS — M5416 Radiculopathy, lumbar region: Secondary | ICD-10-CM | POA: Diagnosis not present

## 2022-10-04 ENCOUNTER — Ambulatory Visit: Payer: Medicare Other

## 2022-10-04 ENCOUNTER — Telehealth: Payer: Self-pay

## 2022-10-04 ENCOUNTER — Telehealth: Payer: Self-pay | Admitting: Internal Medicine

## 2022-10-04 ENCOUNTER — Ambulatory Visit (INDEPENDENT_AMBULATORY_CARE_PROVIDER_SITE_OTHER): Payer: Medicare Other | Admitting: Internal Medicine

## 2022-10-04 ENCOUNTER — Encounter: Payer: Self-pay | Admitting: Internal Medicine

## 2022-10-04 VITALS — BP 188/80 | HR 58 | Ht 67.0 in | Wt 159.2 lb

## 2022-10-04 DIAGNOSIS — I10 Essential (primary) hypertension: Secondary | ICD-10-CM | POA: Diagnosis not present

## 2022-10-04 DIAGNOSIS — E538 Deficiency of other specified B group vitamins: Secondary | ICD-10-CM | POA: Diagnosis not present

## 2022-10-04 DIAGNOSIS — I25810 Atherosclerosis of coronary artery bypass graft(s) without angina pectoris: Secondary | ICD-10-CM

## 2022-10-04 MED ORDER — CYANOCOBALAMIN 1000 MCG/ML IJ SOLN
1000.0000 ug | Freq: Once | INTRAMUSCULAR | Status: AC
  Administered 2022-10-04: 1000 ug via INTRAMUSCULAR

## 2022-10-04 MED ORDER — CHLORTHALIDONE 25 MG PO TABS: 25.0000 mg | ORAL_TABLET | Freq: Every day | ORAL | 1 refills | Status: DC

## 2022-10-04 NOTE — Assessment & Plan Note (Signed)
Not at goal on maximal dose of telmisartan.  Resuming beta blocker not an option due to bradycardia.  Adding chlorthalidone

## 2022-10-04 NOTE — Telephone Encounter (Signed)
Just had a cancellation on Dr. [name] Tullo's schedule for today at 1:30pm.  I spoke with [name], patient's wife, and she states they would like to have this appointment.  Patient has been scheduled to see Dr. Derrel Nip today at 1:30pm.

## 2022-10-04 NOTE — Assessment & Plan Note (Signed)
Managed with monthly b12 injections ,  Given today

## 2022-10-04 NOTE — Progress Notes (Signed)
Subjective:  Patient ID: John Burns, male    DOB: Apr 05, 1941  Age: 81 y.o. MRN: [id number]  CC: The primary encounter diagnosis was Essential hypertension. A diagnosis of B12 deficiency was also pertinent to this visit.   HPI John Burns presents for  Chief Complaint  Patient presents with   Follow-up    Follow up on hypertension    1) Hypertension: patient checked  blood pressure   this morning at home.  Readings have been for the most part > 160/80 at rest . Patient is following a reduce salt diet most days , does not use NSAIDS,, and is taking medications as : telmisartan 80 mg daily  .  Diet reviewed:  wife is not cooking dur to her own health issues.  so eating a lot of casseroles and crackers.    :   Outpatient Medications Prior to Visit  Medication Sig Dispense Refill   aspirin 81 MG tablet Take 1 tablet (81 mg total) by mouth daily. 90 tablet 1   atorvastatin (LIPITOR) 40 MG tablet Take 1 tablet (40 mg total) by mouth daily. 90 tablet 3   Calcium Carb-Cholecalciferol (CALCIUM-VITAMIN D) 500-200 MG-UNIT tablet Take 1 tablet by mouth daily.     donepezil (ARICEPT) 10 MG tablet Take 1 tablet (10 mg total) by mouth at bedtime. 90 tablet 1   levothyroxine (EUTHYROX) 50 MCG tablet Take 1 tablet (50 mcg total) by mouth daily. 90 tablet 1   nitroGLYCERIN (NITROSTAT) 0.4 MG SL tablet Place 1 tablet (0.4 mg total) under the tongue every 5 (five) minutes as needed for chest pain. 50 tablet 3   omeprazole (PRILOSEC) 20 MG capsule Take 1 capsule (20 mg total) by mouth daily. 90 capsule 3   telmisartan (MICARDIS) 80 MG tablet Take 1 tablet (80 mg total) by mouth daily. 90 tablet 1   vitamin E 400 UNIT capsule Take 400 Units by mouth daily.     Facility-Administered Medications Prior to Visit  Medication Dose Route Frequency Provider Last Rate Last Admin   cyanocobalamin ((VITAMIN B-12)) injection 1,000 mcg  1,000 mcg Intramuscular Once Crecencio Mc, MD        Review of  Systems;  Patient denies headache, fevers, malaise, unintentional weight loss, skin rash, eye pain, sinus congestion and sinus pain, sore throat, dysphagia,  hemoptysis , cough, dyspnea, wheezing, chest pain, palpitations, orthopnea, edema, abdominal pain, nausea, melena, diarrhea, constipation, flank pain, dysuria, hematuria, urinary  Frequency, nocturia, numbness, tingling, seizures,  Focal weakness, Loss of consciousness,  Tremor, insomnia, depression, anxiety, and suicidal ideation.      Objective:  BP (!) 188/80 (BP Location: Left Arm, Patient Position: Sitting, Cuff Size: Normal)   Pulse (!) 58   Ht '5\' 7"'$  (1.702 m)   Wt 159 lb 3.2 oz (72.2 kg)   SpO2 95%   BMI 24.93 kg/m   BP Readings from Last 3 Encounters:  10/04/22 (!) 188/80  07/09/22 (!) 146/76  01/01/22 120/74    Wt Readings from Last 3 Encounters:  10/04/22 159 lb 3.2 oz (72.2 kg)  07/09/22 154 lb 6.4 oz (70 kg)  01/01/22 156 lb (70.8 kg)    General appearance: alert, cooperative and appears stated age Ears: normal TM's and external ear canals both ears Throat: lips, mucosa, and tongue normal; teeth and gums normal Neck: no adenopathy, no carotid bruit, supple, symmetrical, trachea midline and thyroid not enlarged, symmetric, no tenderness/mass/nodules Back: symmetric, no curvature. ROM normal. No CVA tenderness. Lungs: clear  to auscultation bilaterally Heart: regular rate and rhythm, S1, S2 normal, no murmur, click, rub or gallop Abdomen: soft, non-tender; bowel sounds normal; no masses,  no organomegaly Pulses: 2+ and symmetric Skin: Skin color, texture, turgor normal. No rashes or lesions Lymph nodes: Cervical, supraclavicular, and axillary nodes normal. Neuro:  awake and interactive with normal mood and affect. Higher cortical functions are normal. Speech is clear without word-finding difficulty or dysarthria. Extraocular movements are intact. Visual fields of both eyes are grossly intact. Sensation to light  touch is grossly intact bilaterally of upper and lower extremities. Motor examination shows 4+/5 symmetric hand grip and upper extremity and 5/5 lower extremity strength. There is no pronation or drift. Gait is non-ataxic   Lab Results  Component Value Date   HGBA1C 6.2 07/09/2022   HGBA1C 5.9 01/01/2022   HGBA1C 5.7 07/01/2021    Lab Results  Component Value Date   CREATININE 1.13 07/09/2022   CREATININE 1.11 01/01/2022   CREATININE 1.13 07/01/2021    Lab Results  Component Value Date   WBC 6.5 01/01/2022   HGB 13.8 01/01/2022   HCT 41.6 01/01/2022   PLT 174.0 01/01/2022   GLUCOSE 84 07/09/2022   CHOL 130 07/09/2022   TRIG 58.0 07/09/2022   HDL 49.70 07/09/2022   LDLCALC 69 07/09/2022   ALT 15 07/09/2022   AST 17 07/09/2022   NA 143 07/09/2022   K 4.6 07/09/2022   CL 108 07/09/2022   CREATININE 1.13 07/09/2022   BUN 18 07/09/2022   CO2 28 07/09/2022   TSH 3.54 07/09/2022   PSA 2.33 05/22/2015   HGBA1C 6.2 07/09/2022   MICROALBUR 1.0 01/01/2022    MR LUMBAR SPINE WO CONTRAST  Result Date: 06/14/2022 CLINICAL DATA:  Lumbar radiculitis. Chronic low back pain with left-sided sciatica. EXAM: MRI LUMBAR SPINE WITHOUT CONTRAST TECHNIQUE: Multiplanar, multisequence MR imaging of the lumbar spine was performed. No intravenous contrast was administered. COMPARISON:  Lumbar spine MRI 04/18/2020 FINDINGS: Segmentation:  Standard. Alignment:  Unchanged trace retrolisthesis of L2 on L3. Vertebrae: No fracture, suspicious marrow lesion, or significant marrow edema. Chronic degenerative endplate changes at P10-25, L2-3, and L5-S1. Conus medullaris and cauda equina: Conus extends to the L1-2 level. Unchanged small lipoma of the filum. Paraspinal and other soft tissues: Left renal sinus cysts. Disc levels: Disc desiccation throughout the lumbar and lower thoracic spine. Unchanged severe disc space narrowing at T11-12 and L5-S1 and moderate narrowing at L2-3. T12-L1: No disc herniation or  stenosis. L1-2: Disc bulging results in mild left neural foraminal stenosis without spinal stenosis, unchanged. L2-3: Disc bulging, a chronic left subarticular disc extrusion with mild cranial migration, and mild facet hypertrophy result in severe left lateral recess and moderate left neural foraminal stenosis with potential left L2 and L3 nerve root impingement. The morphology of the disc extrusion is mildly different than in 2021, overall appearing perhaps slightly smaller but with slightly greater medial extent in the lateral recess. The overall degree of stenosis is similar to the prior MRI. Borderline spinal stenosis. L3-4: Mild disc bulging with central and left foraminal annular fissures and mild facet hypertrophy, unchanged. No significant stenosis. L4-5: Disc bulging, a right central disc protrusion with annular fissure, and mild facet hypertrophy result in mild bilateral lateral recess stenosis and borderline spinal stenosis without neural foraminal stenosis, unchanged. L5-S1: Circumferential disc osteophyte complex and mild facet hypertrophy result in minimal bilateral lateral recess stenosis and moderate bilateral neural foraminal stenosis without spinal stenosis, unchanged. IMPRESSION: Multilevel lumbar disc and  facet degeneration without significant interval change, most notable at L2-3 where a chronic disc extrusion results in left lateral recess and left neural foraminal stenosis. Electronically Signed   By: Logan Bores M.D.   On: 06/14/2022 18:25    Assessment & Plan:   Problem List Items Addressed This Visit     B12 deficiency    Managed with monthly b12 injections ,  Given today      Essential hypertension - Primary    Not at goal on maximal dose of telmisartan.  Resuming beta blocker not an option due to bradycardia.  Adding chlorthalidone      Relevant Medications   chlorthalidone (HYGROTON) 25 MG tablet   Other Relevant Orders   Basic metabolic panel   Follow-up: Return in  about 2 weeks (around 10/18/2022).   Crecencio Mc, MD

## 2022-10-04 NOTE — Telephone Encounter (Signed)
Pt wife called in stating pt BP is high 184/83 sent to access nurse

## 2022-10-04 NOTE — Telephone Encounter (Signed)
Access Nurse called back to state she recommends patient be seen in the next 24 hours.  We do not have any available appointments today in [place] or [place].  Patient's wife states they are not willing/able to go to [place].  Patient is coming in today at 3:30pm for his B12 injection.  Please advise.

## 2022-10-04 NOTE — Patient Instructions (Addendum)
Your blood pressure  is elevated so we are adding medication today   Please continue telmisartan 80 mg daily and add chlorthalidone 25 mg daily in the morning   Ask [name] to be careful about the salt in your casseroles and other dishes ]  No added salt .  Use "John Burns"     Return in 2 weeks for BP check and lab visit .   Bring your home machine with you again

## 2022-10-04 NOTE — Telephone Encounter (Signed)
noted 

## 2022-10-18 ENCOUNTER — Ambulatory Visit (INDEPENDENT_AMBULATORY_CARE_PROVIDER_SITE_OTHER): Payer: Medicare Other

## 2022-10-18 DIAGNOSIS — I10 Essential (primary) hypertension: Secondary | ICD-10-CM | POA: Diagnosis not present

## 2022-10-18 NOTE — Progress Notes (Cosign Needed Addendum)
Pt presented to have his BP rechecked and to calibrate his BP machine. Pts 1st BP reading was 112/71 on the left arm. I let pt rest for 5-6 minutes and I watched him put on his own BP cuff on the left arm and I placed our machine on the right arm to compare readings. His machine read 141/64 ; and our machine read 130/73 .  I advised the pt to take at least 10 points off of his top number during his at home readings to be as close to our readings as possible. Pts final BP reading was 107/65 on the left arm.   I have reviewed the above information and agree with above.   Continue current medication regimen   Deborra Medina, MD

## 2022-10-19 ENCOUNTER — Other Ambulatory Visit: Payer: Self-pay | Admitting: Internal Medicine

## 2022-10-19 ENCOUNTER — Telehealth: Payer: Self-pay

## 2022-10-19 DIAGNOSIS — R944 Abnormal results of kidney function studies: Secondary | ICD-10-CM

## 2022-10-19 DIAGNOSIS — I10 Essential (primary) hypertension: Secondary | ICD-10-CM

## 2022-10-19 LAB — BASIC METABOLIC PANEL
BUN: 29 mg/dL — ABNORMAL HIGH (ref 6–23)
CO2: 26 mEq/L (ref 19–32)
Calcium: 9.7 mg/dL (ref 8.4–10.5)
Chloride: 103 mEq/L (ref 96–112)
Creatinine, Ser: 1.45 mg/dL (ref 0.40–1.50)
GFR: 45.06 mL/min — ABNORMAL LOW (ref >60.00)
Glucose, Bld: 105 mg/dL — ABNORMAL HIGH (ref 70–99)
Potassium: 4.3 mEq/L (ref 3.5–5.1)
Sodium: 136 mEq/L (ref 135–145)

## 2022-10-19 MED ORDER — AMLODIPINE BESYLATE 2.5 MG PO TABS: 2.5000 mg | ORAL_TABLET | Freq: Every day | ORAL | 0 refills | Status: DC

## 2022-10-19 NOTE — Assessment & Plan Note (Signed)
Stopping chlorthalidone due to drop in GFR.  Starting amlodipine 2.5 mg daily  continue telmisartan RTC 2 weeks for BMET

## 2022-10-19 NOTE — Telephone Encounter (Signed)
Patient's wife, Demon Volante, called to state patient is unsure about why we changed his medication to amLODipine (NORVASC) 2.5 MG tablet and what the medication is for.

## 2022-10-19 NOTE — Telephone Encounter (Signed)
See result note message 

## 2022-11-01 ENCOUNTER — Ambulatory Visit (INDEPENDENT_AMBULATORY_CARE_PROVIDER_SITE_OTHER): Payer: Medicare Other

## 2022-11-01 DIAGNOSIS — R944 Abnormal results of kidney function studies: Secondary | ICD-10-CM

## 2022-11-01 NOTE — Progress Notes (Signed)
Pt presented to have his BP checked and to get labs. Pt was identified through two identifiers. Pts BP reading was 125/68; due to the BP not being elevated pt was able to go to the lab. Pt brought in his BP readings log which has been placed in the providers quick sign folder for review. Pt was given another BP log to continue to monitor BP readings.

## 2022-11-01 NOTE — Addendum Note (Signed)
Addended by: Crecencio Mc on: 11/01/2022 11:26 PM   Modules accepted: Level of Service

## 2022-11-02 LAB — BASIC METABOLIC PANEL
BUN: 18 mg/dL (ref 6–23)
CO2: 28 mEq/L (ref 19–32)
Calcium: 9.6 mg/dL (ref 8.4–10.5)
Chloride: 103 mEq/L (ref 96–112)
Creatinine, Ser: 1.25 mg/dL (ref 0.40–1.50)
GFR: 53.83 mL/min — ABNORMAL LOW (ref >60.00)
Glucose, Bld: 106 mg/dL — ABNORMAL HIGH (ref 70–99)
Potassium: 4.1 mEq/L (ref 3.5–5.1)
Sodium: 138 mEq/L (ref 135–145)

## 2022-11-03 NOTE — Telephone Encounter (Signed)
Error

## 2022-11-04 ENCOUNTER — Ambulatory Visit (INDEPENDENT_AMBULATORY_CARE_PROVIDER_SITE_OTHER): Payer: Medicare Other

## 2022-11-04 DIAGNOSIS — E538 Deficiency of other specified B group vitamins: Secondary | ICD-10-CM | POA: Diagnosis not present

## 2022-11-04 MED ORDER — CYANOCOBALAMIN 1000 MCG/ML IJ SOLN
1000.0000 ug | Freq: Once | INTRAMUSCULAR | Status: AC
  Administered 2022-11-04: 1000 ug via INTRAMUSCULAR

## 2022-11-04 NOTE — Progress Notes (Signed)
Pt presented for their vitamin B12 injection. Pt was identified through two identifiers. Pt tolerated shot well in their left  deltoid.  

## 2022-11-09 ENCOUNTER — Telehealth: Payer: Self-pay | Admitting: Internal Medicine

## 2022-11-09 NOTE — Telephone Encounter (Signed)
Copied from [place] [id number]. Topic: Medicare AWV >> Nov 09, 2022 10:10 AM Devoria Glassing wrote: Reason for CRM: Attempted to schedule AWV. Unable to LVM.  Will try at later time.

## 2022-11-30 ENCOUNTER — Telehealth: Payer: Self-pay

## 2022-11-30 MED ORDER — OMEPRAZOLE 20 MG PO CPDR: 20.0000 mg | DELAYED_RELEASE_CAPSULE | Freq: Every day | ORAL | 3 refills | Status: DC

## 2022-12-01 DIAGNOSIS — E785 Hyperlipidemia, unspecified: Secondary | ICD-10-CM | POA: Diagnosis not present

## 2022-12-01 DIAGNOSIS — I2510 Atherosclerotic heart disease of native coronary artery without angina pectoris: Secondary | ICD-10-CM | POA: Diagnosis not present

## 2022-12-01 DIAGNOSIS — J449 Chronic obstructive pulmonary disease, unspecified: Secondary | ICD-10-CM | POA: Diagnosis not present

## 2022-12-01 DIAGNOSIS — Z9889 Other specified postprocedural states: Secondary | ICD-10-CM | POA: Diagnosis not present

## 2022-12-01 DIAGNOSIS — R001 Bradycardia, unspecified: Secondary | ICD-10-CM | POA: Diagnosis not present

## 2022-12-01 DIAGNOSIS — I471 Supraventricular tachycardia, unspecified: Secondary | ICD-10-CM | POA: Diagnosis not present

## 2022-12-01 DIAGNOSIS — R0789 Other chest pain: Secondary | ICD-10-CM | POA: Diagnosis not present

## 2022-12-01 DIAGNOSIS — R0602 Shortness of breath: Secondary | ICD-10-CM | POA: Diagnosis not present

## 2022-12-01 DIAGNOSIS — I25708 Atherosclerosis of coronary artery bypass graft(s), unspecified, with other forms of angina pectoris: Secondary | ICD-10-CM | POA: Diagnosis not present

## 2022-12-06 ENCOUNTER — Ambulatory Visit (INDEPENDENT_AMBULATORY_CARE_PROVIDER_SITE_OTHER): Payer: Medicare Other

## 2022-12-06 DIAGNOSIS — E538 Deficiency of other specified B group vitamins: Secondary | ICD-10-CM

## 2022-12-06 NOTE — Progress Notes (Signed)
Pt presented for their vitamin B12 injection. Pt was identified through two identifiers. Pt tolerated shot well in their right deltoid.  

## 2022-12-13 MED ORDER — DONEPEZIL HCL 10 MG PO TABS: 10.0000 mg | ORAL_TABLET | Freq: Every day | ORAL | 1 refills | Status: DC

## 2022-12-13 MED ORDER — ATORVASTATIN CALCIUM 40 MG PO TABS: 40.0000 mg | ORAL_TABLET | Freq: Every day | ORAL | 3 refills | Status: DC

## 2022-12-13 NOTE — Telephone Encounter (Signed)
Pt need a refill on atorvastatin, omeprazole, donepezil sent to [facility]. Pt daughter would like a call back regarding the directions of the medication

## 2022-12-13 NOTE — Telephone Encounter (Signed)
Spoke with pt's daughter to let her know that medications have been refilled and gave her the directions use for each. Daughter gave a verbal understanding.

## 2022-12-13 NOTE — Addendum Note (Signed)
Addended by: Adair Laundry on: 12/13/2022 01:24 PM   Modules accepted: Orders

## 2022-12-14 ENCOUNTER — Other Ambulatory Visit: Payer: Self-pay | Admitting: Internal Medicine

## 2023-01-10 ENCOUNTER — Ambulatory Visit (INDEPENDENT_AMBULATORY_CARE_PROVIDER_SITE_OTHER): Payer: Medicare Other | Admitting: Internal Medicine

## 2023-01-10 ENCOUNTER — Encounter: Payer: Self-pay | Admitting: Internal Medicine

## 2023-01-10 VITALS — BP 172/72 | HR 55 | Ht 67.0 in | Wt 160.2 lb

## 2023-01-10 DIAGNOSIS — M5416 Radiculopathy, lumbar region: Secondary | ICD-10-CM

## 2023-01-10 DIAGNOSIS — R7303 Prediabetes: Secondary | ICD-10-CM | POA: Diagnosis not present

## 2023-01-10 DIAGNOSIS — R5383 Other fatigue: Secondary | ICD-10-CM

## 2023-01-10 DIAGNOSIS — K22719 Barrett's esophagus with dysplasia, unspecified: Secondary | ICD-10-CM

## 2023-01-10 DIAGNOSIS — F01A4 Vascular dementia, mild, with anxiety: Secondary | ICD-10-CM

## 2023-01-10 DIAGNOSIS — E039 Hypothyroidism, unspecified: Secondary | ICD-10-CM | POA: Diagnosis not present

## 2023-01-10 DIAGNOSIS — I10 Essential (primary) hypertension: Secondary | ICD-10-CM | POA: Diagnosis not present

## 2023-01-10 DIAGNOSIS — E785 Hyperlipidemia, unspecified: Secondary | ICD-10-CM

## 2023-01-10 DIAGNOSIS — M48061 Spinal stenosis, lumbar region without neurogenic claudication: Secondary | ICD-10-CM

## 2023-01-10 DIAGNOSIS — E538 Deficiency of other specified B group vitamins: Secondary | ICD-10-CM | POA: Diagnosis not present

## 2023-01-10 DIAGNOSIS — I25810 Atherosclerosis of coronary artery bypass graft(s) without angina pectoris: Secondary | ICD-10-CM

## 2023-01-10 LAB — COMPREHENSIVE METABOLIC PANEL
ALT: 11 U/L (ref 0–53)
AST: 14 U/L (ref 0–37)
Albumin: 4.1 g/dL (ref 3.5–5.2)
Alkaline Phosphatase: 104 U/L (ref 39–117)
BUN: 14 mg/dL (ref 6–23)
CO2: 27 mEq/L (ref 19–32)
Calcium: 9.7 mg/dL (ref 8.4–10.5)
Chloride: 106 mEq/L (ref 96–112)
Creatinine, Ser: 1.06 mg/dL (ref 0.40–1.50)
GFR: 65.52 mL/min (ref >60.00)
Glucose, Bld: 94 mg/dL (ref 70–99)
Potassium: 3.9 mEq/L (ref 3.5–5.1)
Sodium: 140 mEq/L (ref 135–145)
Total Bilirubin: 0.7 mg/dL (ref 0.2–1.2)
Total Protein: 6.3 g/dL (ref 6.0–8.3)

## 2023-01-10 LAB — HEMOGLOBIN A1C: Hgb A1c MFr Bld: 5.5 % (ref 4.6–6.5)

## 2023-01-10 LAB — LIPID PANEL
Cholesterol: 145 mg/dL (ref 0–200)
HDL: 50.5 mg/dL (ref >39.00)
LDL Cholesterol: 82 mg/dL (ref 0–99)
NonHDL: 94.68
Total CHOL/HDL Ratio: 3
Triglycerides: 62 mg/dL (ref 0.0–149.0)
VLDL: 12.4 mg/dL (ref 0.0–40.0)

## 2023-01-10 LAB — CBC WITH DIFFERENTIAL/PLATELET
Basophils Absolute: 0 10*3/uL (ref 0.0–0.1)
Basophils Relative: 0.5 % (ref 0.0–3.0)
Eosinophils Absolute: 0.2 10*3/uL (ref 0.0–0.7)
Eosinophils Relative: 3.7 % (ref 0.0–5.0)
HCT: 42.2 % (ref 39.0–52.0)
Hemoglobin: 14 g/dL (ref 13.0–17.0)
Lymphocytes Relative: 19 % (ref 12.0–46.0)
Lymphs Abs: 1.2 10*3/uL (ref 0.7–4.0)
MCHC: 33.2 g/dL (ref 30.0–36.0)
MCV: 95.3 fl (ref 78.0–100.0)
Monocytes Absolute: 0.7 10*3/uL (ref 0.1–1.0)
Monocytes Relative: 10.5 % (ref 3.0–12.0)
Neutro Abs: 4.1 10*3/uL (ref 1.4–7.7)
Neutrophils Relative %: 66.3 % (ref 43.0–77.0)
Platelets: 164 10*3/uL (ref 150.0–400.0)
RBC: 4.43 Mil/uL (ref 4.22–5.81)
RDW: 14.1 % (ref 11.5–15.5)
WBC: 6.2 10*3/uL (ref 4.0–10.5)

## 2023-01-10 LAB — LDL CHOLESTEROL, DIRECT: Direct LDL: 82 mg/dL

## 2023-01-10 LAB — MICROALBUMIN / CREATININE URINE RATIO
Creatinine,U: 161.1 mg/dL
Microalb Creat Ratio: 0.6 mg/g (ref 0.0–30.0)
Microalb, Ur: 0.9 mg/dL (ref 0.0–1.9)

## 2023-01-10 LAB — TSH: TSH: 3.72 u[IU]/mL (ref 0.35–5.50)

## 2023-01-10 MED ORDER — AMLODIPINE BESYLATE 2.5 MG PO TABS: 2.5000 mg | ORAL_TABLET | Freq: Every day | ORAL | 0 refills | Status: DC

## 2023-01-10 MED ORDER — CYANOCOBALAMIN 1000 MCG/ML IJ SOLN
1000.0000 ug | Freq: Once | INTRAMUSCULAR | Status: AC
  Administered 2023-01-10: 1000 ug via INTRAMUSCULAR

## 2023-01-10 MED ORDER — TELMISARTAN 80 MG PO TABS: 80.0000 mg | ORAL_TABLET | Freq: Every day | ORAL | 1 refills | Status: DC

## 2023-01-10 MED ORDER — LEVOTHYROXINE SODIUM 50 MCG PO TABS: 50.0000 ug | ORAL_TABLET | Freq: Every day | ORAL | 1 refills | Status: DC

## 2023-01-10 NOTE — Progress Notes (Signed)
Subjective:  Patient ID: John Burns, male    DOB: 12-29-1940  Age: 82 y.o. MRN: [id number]  CC: The primary encounter diagnosis was Essential hypertension. Diagnoses of Hypothyroidism (acquired), Prediabetes, Hyperlipidemia, unspecified hyperlipidemia type, Other fatigue, Barrett's esophagus with dysplasia, Coronary artery disease involving coronary bypass graft of native heart without angina pectoris, Mild vascular dementia with anxiety ([place]), Spinal stenosis of lumbar region with radiculopathy, and B12 deficiency were also pertinent to this visit.   HPI John Burns presents for  Chief Complaint  Patient presents with   Medical Management of Chronic Issues    6 month follow up    1) hypothyroid : taking medication early am and "going back to be afterward"  2) sleeping well.  Up to void only once per night  3) dementia: taking aricept.  Daughters more involved John Burns)  4) HTN:  takes telmisartan at night 80 mg dose and amlcipidne 2.g mf in the am.  DID NOT TAKE IT TODAY. HAS NOT BEEN CHECKING AT HOME   Outpatient Medications Prior to Visit  Medication Sig Dispense Refill   aspirin 81 MG tablet Take 1 tablet (81 mg total) by mouth daily. 90 tablet 1   atorvastatin (LIPITOR) 40 MG tablet Take 1 tablet (40 mg total) by mouth daily. 90 tablet 3   Calcium Carb-Cholecalciferol (CALCIUM-VITAMIN D) 500-200 MG-UNIT tablet Take 1 tablet by mouth daily.     donepezil (ARICEPT) 10 MG tablet Take 1 tablet (10 mg total) by mouth at bedtime. 90 tablet 1   nitroGLYCERIN (NITROSTAT) 0.4 MG SL tablet Place 1 tablet (0.4 mg total) under the tongue every 5 (five) minutes as needed for chest pain. 50 tablet 3   omeprazole (PRILOSEC) 20 MG capsule Take 1 capsule (20 mg total) by mouth daily. 90 capsule 3   vitamin E 400 UNIT capsule Take 400 Units by mouth daily.     amLODipine (NORVASC) 2.5 MG tablet Take 1 tablet (2.5 mg total) by mouth daily. 90 tablet 0   levothyroxine (EUTHYROX) 50  MCG tablet Take 1 tablet (50 mcg total) by mouth daily. 90 tablet 1   telmisartan (MICARDIS) 80 MG tablet Take 1 tablet (80 mg total) by mouth daily. 90 tablet 1   No facility-administered medications prior to visit.    Review of Systems;  Patient denies headache, fevers, malaise, unintentional weight loss, skin rash, eye pain, sinus congestion and sinus pain, sore throat, dysphagia,  hemoptysis , cough, dyspnea, wheezing, chest pain, palpitations, orthopnea, edema, abdominal pain, nausea, melena, diarrhea, constipation, flank pain, dysuria, hematuria, urinary  Frequency, nocturia, numbness, tingling, seizures,  Focal weakness, Loss of consciousness,  Tremor, insomnia, depression, anxiety, and suicidal ideation.      Objective:  BP (!) 172/72   Pulse (!) 55   Ht 5' 7"$  (1.702 m)   Wt 160 lb 3.2 oz (72.7 kg)   SpO2 98%   BMI 25.09 kg/m   BP Readings from Last 3 Encounters:  01/10/23 (!) 172/72  11/01/22 125/68  10/18/22 107/65    Wt Readings from Last 3 Encounters:  01/10/23 160 lb 3.2 oz (72.7 kg)  10/04/22 159 lb 3.2 oz (72.2 kg)  07/09/22 154 lb 6.4 oz (70 kg)    Physical Exam Vitals reviewed.  Constitutional:      General: He is not in acute distress.    Appearance: Normal appearance. He is normal weight. He is not ill-appearing, toxic-appearing or diaphoretic.  HENT:     Head: Normocephalic.  Eyes:  General: No scleral icterus.       Right eye: No discharge.        Left eye: No discharge.     Conjunctiva/sclera: Conjunctivae normal.  Cardiovascular:     Rate and Rhythm: Normal rate and regular rhythm.     Heart sounds: Normal heart sounds.  Pulmonary:     Effort: Pulmonary effort is normal. No respiratory distress.     Breath sounds: Normal breath sounds.  Musculoskeletal:        General: Normal range of motion.     Cervical back: Normal range of motion.  Skin:    General: Skin is warm and dry.  Neurological:     General: No focal deficit present.      Mental Status: He is alert and oriented to person, place, and time. Mental status is at baseline.     Coordination: Coordination normal.     Gait: Gait normal.  Psychiatric:        Mood and Affect: Mood normal.        Behavior: Behavior normal.        Thought Content: Thought content normal.        Judgment: Judgment normal.     Lab Results  Component Value Date   HGBA1C 5.5 01/10/2023   HGBA1C 6.2 07/09/2022   HGBA1C 5.9 01/01/2022    Lab Results  Component Value Date   CREATININE 1.06 01/10/2023   CREATININE 1.25 11/01/2022   CREATININE 1.45 10/18/2022    Lab Results  Component Value Date   WBC 6.2 01/10/2023   HGB 14.0 01/10/2023   HCT 42.2 01/10/2023   PLT 164.0 01/10/2023   GLUCOSE 94 01/10/2023   CHOL 145 01/10/2023   TRIG 62.0 01/10/2023   HDL 50.50 01/10/2023   LDLDIRECT 82.0 01/10/2023   LDLCALC 82 01/10/2023   ALT 11 01/10/2023   AST 14 01/10/2023   NA 140 01/10/2023   K 3.9 01/10/2023   CL 106 01/10/2023   CREATININE 1.06 01/10/2023   BUN 14 01/10/2023   CO2 27 01/10/2023   TSH 3.72 01/10/2023   PSA 2.33 05/22/2015   HGBA1C 5.5 01/10/2023   MICROALBUR 0.9 01/10/2023    MR LUMBAR SPINE WO CONTRAST  Result Date: 06/14/2022 CLINICAL DATA:  Lumbar radiculitis. Chronic low back pain with left-sided sciatica. EXAM: MRI LUMBAR SPINE WITHOUT CONTRAST TECHNIQUE: Multiplanar, multisequence MR imaging of the lumbar spine was performed. No intravenous contrast was administered. COMPARISON:  Lumbar spine MRI 04/18/2020 FINDINGS: Segmentation:  Standard. Alignment:  Unchanged trace retrolisthesis of L2 on L3. Vertebrae: No fracture, suspicious marrow lesion, or significant marrow edema. Chronic degenerative endplate changes at QA348G, L2-3, and L5-S1. Conus medullaris and cauda equina: Conus extends to the L1-2 level. Unchanged small lipoma of the filum. Paraspinal and other soft tissues: Left renal sinus cysts. Disc levels: Disc desiccation throughout the lumbar and  lower thoracic spine. Unchanged severe disc space narrowing at T11-12 and L5-S1 and moderate narrowing at L2-3. T12-L1: No disc herniation or stenosis. L1-2: Disc bulging results in mild left neural foraminal stenosis without spinal stenosis, unchanged. L2-3: Disc bulging, a chronic left subarticular disc extrusion with mild cranial migration, and mild facet hypertrophy result in severe left lateral recess and moderate left neural foraminal stenosis with potential left L2 and L3 nerve root impingement. The morphology of the disc extrusion is mildly different than in 2021, overall appearing perhaps slightly smaller but with slightly greater medial extent in the lateral recess. The overall degree of stenosis  is similar to the prior MRI. Borderline spinal stenosis. L3-4: Mild disc bulging with central and left foraminal annular fissures and mild facet hypertrophy, unchanged. No significant stenosis. L4-5: Disc bulging, a right central disc protrusion with annular fissure, and mild facet hypertrophy result in mild bilateral lateral recess stenosis and borderline spinal stenosis without neural foraminal stenosis, unchanged. L5-S1: Circumferential disc osteophyte complex and mild facet hypertrophy result in minimal bilateral lateral recess stenosis and moderate bilateral neural foraminal stenosis without spinal stenosis, unchanged. IMPRESSION: Multilevel lumbar disc and facet degeneration without significant interval change, most notable at L2-3 where a chronic disc extrusion results in left lateral recess and left neural foraminal stenosis. Electronically Signed   By: Logan Bores M.D.   On: 06/14/2022 18:25    Assessment & Plan:  .Essential hypertension Assessment & Plan:   has not taken amlpdipine this morning.  he reports compliance with medication regimen  but has an elevated reading today in office.  He has been asked to check his BP at work and  submit readings for evaluation. Renal function will be checked  today   Orders: -     Comprehensive metabolic panel -     Microalbumin / creatinine urine ratio  Hypothyroidism (acquired) -     TSH  Prediabetes -     Comprehensive metabolic panel -     Hemoglobin A1c  Hyperlipidemia, unspecified hyperlipidemia type -     Lipid panel -     LDL cholesterol, direct  Other fatigue -     CBC with Differential/Platelet  Barrett's esophagus with dysplasia Assessment & Plan: Continue omeprazole.  No symptoms of cough, reflux or dysphagia    Coronary artery disease involving coronary bypass graft of native heart without angina pectoris Assessment & Plan: He is asymptomatic and taking atorvastatin, , ASA ad telmisartan.  Metoprolol was stopped due to persitent sinus bradycardia, symptomatic.    LDL is at goal.  He has had his annual follow up with Dr Saralyn Pilar and no medication changes were made. He denies chest pain .  Continue current meds. l Lab Results  Component Value Date   CHOL 130 07/09/2022   HDL 49.70 07/09/2022   LDLCALC 69 07/09/2022   TRIG 58.0 07/09/2022   CHOLHDL 3 07/09/2022      Mild vascular dementia with anxiety [facility]) Assessment & Plan: His symptoms have not progressed , and he is tolerating aricept  .  His daughters are involved in his care since his wife has been unable to assume more responsibility I managing his medication    Spinal stenosis of lumbar region with radiculopathy Assessment & Plan: His chronic pain is managed with periodic ESI  by [facility],  Last one in August 2023.  DHe denies daily pain with scheuled use of 1000 mg tylenol q 12 hours   B12 deficiency -     Cyanocobalamin  Other orders -     amLODIPine Besylate; Take 1 tablet (2.5 mg total) by mouth daily.  Dispense: 90 tablet; Refill: 0 -     Levothyroxine Sodium; Take 1 tablet (50 mcg total) by mouth daily.  Dispense: 90 tablet; Refill: 1 -     Telmisartan; Take 1 tablet (80 mg total) by mouth daily.  Dispense: 90 tablet; Refill: 1     I  provided 30 minutes of face-to-face time during this encounter reviewing patient's last visit with me, patient's  most recent visit with cardiology,  orthopedics, ,  and neurology,  recent surgical and non  surgical procedures, previous  labs and imaging studies, counseling on currently addressed issues,  and post visit ordering to diagnostics and therapeutics .   Follow-up: Return in about 6 months (around 07/11/2023).   Crecencio Mc, MD

## 2023-01-10 NOTE — Assessment & Plan Note (Signed)
His symptoms have not progressed , and he is tolerating aricept  .  His daughters are involved in his care since his wife has been unable to assume more responsibility I managing his medication

## 2023-01-10 NOTE — Patient Instructions (Addendum)
Annual Wellness visit is due. Please schedule this appointment at check out.   You are VERY overdue for tetanus  BOOSTER  The Tdap (tetanus-diphtheria-whooping cough vaccine ) and Shingrix vaccines are now  COVERED BY MEDICARE if you get them at your pharmacy as of  January 1.   You can expect 24 hours of flu like symptoms after receiving the shingles vaccine, so plan accordingly.     YOUR BLOOD PRESSURE IS VERY HIGH TODAY  .  Please check your blood pressure a few times at home and send me the readings so I can determine if you need to increase your amlodipine dose.  ALWAYS take your scheduled blood pressure medications before you come to see me

## 2023-01-10 NOTE — Assessment & Plan Note (Signed)
has not taken amlpdipine this morning.  he reports compliance with medication regimen  but has an elevated reading today in office.  He has been asked to check his BP at work and  submit readings for evaluation. Renal function will be checked today

## 2023-01-10 NOTE — Assessment & Plan Note (Signed)
He is asymptomatic and taking atorvastatin, , ASA ad telmisartan.  Metoprolol was stopped due to persitent sinus bradycardia, symptomatic.    LDL is at goal.  He has had his annual follow up with Dr Saralyn Pilar and no medication changes were made. He denies chest pain .  Continue current meds. l Lab Results  Component Value Date   CHOL 130 07/09/2022   HDL 49.70 07/09/2022   LDLCALC 69 07/09/2022   TRIG 58.0 07/09/2022   CHOLHDL 3 07/09/2022

## 2023-01-10 NOTE — Assessment & Plan Note (Signed)
His chronic pain is managed with periodic ESI  by [facility],  Last one in August 2023.  DHe denies daily pain with scheuled use of 1000 mg tylenol q 12 hours

## 2023-01-10 NOTE — Assessment & Plan Note (Signed)
Continue omeprazole.  No symptoms of cough, reflux or dysphagia

## 2023-01-19 DIAGNOSIS — Z961 Presence of intraocular lens: Secondary | ICD-10-CM | POA: Diagnosis not present

## 2023-01-21 ENCOUNTER — Telehealth: Payer: Self-pay | Admitting: Internal Medicine

## 2023-01-21 NOTE — Telephone Encounter (Signed)
Contacted Judie Bonus to schedule their annual wellness visit. Appointment made for 01/28/2023.  Thank you,  Winnie Direct dial  [phone]

## 2023-01-28 ENCOUNTER — Ambulatory Visit (INDEPENDENT_AMBULATORY_CARE_PROVIDER_SITE_OTHER): Payer: Medicare Other

## 2023-01-28 VITALS — Ht 67.0 in | Wt 160.0 lb

## 2023-01-28 DIAGNOSIS — Z0000 Encounter for general adult medical examination without abnormal findings: Secondary | ICD-10-CM | POA: Diagnosis not present

## 2023-01-28 NOTE — Patient Instructions (Addendum)
John Burns , Thank you for taking time to come for your Medicare Wellness Visit. I appreciate your ongoing commitment to your health goals. Please review the following plan we discussed and let me know if I can assist you in the future.   These are the goals we discussed:  Goals       Patient Stated     Follow up with Primary Care Provider (pt-stated)      As needed.        This is a list of the screening recommended for you and due dates:  Health Maintenance  Topic Date Due   DTaP/Tdap/Td vaccine (2 - Td or Tdap) 11/22/2019   COVID-19 Vaccine (4 - 2023-24 season) 02/13/2023*   Zoster (Shingles) Vaccine (1 of 2) 04/10/2023*   Medicare Annual Wellness Visit  01/28/2024   Pneumonia Vaccine  Completed   Flu Shot  Completed   HPV Vaccine  Aged Out   Colon Cancer Screening  Discontinued  *Topic was postponed. The date shown is not the original due date.    Advanced directives: End of life planning; Advance aging; Advanced directives discussed.  Copy of current HCPOA/Living Will requested.    Conditions/risks identified: none new.  Next appointment: Follow up in one year for your annual wellness visit.   Preventive Care 7 Years and Older, Male  Preventive care refers to lifestyle choices and visits with your health care provider that can promote health and wellness. What does preventive care include? A yearly physical exam. This is also called an annual well check. Dental exams once or twice a year. Routine eye exams. Ask your health care provider how often you should have your eyes checked. Personal lifestyle choices, including: Daily care of your teeth and gums. Regular physical activity. Eating a healthy diet. Avoiding tobacco and drug use. Limiting alcohol use. Practicing safe sex. Taking low doses of aspirin every day. Taking vitamin and mineral supplements as recommended by your health care provider. What happens during an annual well check? The services and  screenings done by your health care provider during your annual well check will depend on your age, overall health, lifestyle risk factors, and family history of disease. Counseling  Your health care provider may ask you questions about your: Alcohol use. Tobacco use. Drug use. Emotional well-being. Home and relationship well-being. Sexual activity. Eating habits. History of falls. Memory and ability to understand (cognition). Work and work Statistician. Screening  You may have the following tests or measurements: Height, weight, and BMI. Blood pressure. Lipid and cholesterol levels. These may be checked every 5 years, or more frequently if you are over 22 years old. Skin check. Lung cancer screening. You may have this screening every year starting at age 43 if you have a 30-pack-year history of smoking and currently smoke or have quit within the past 15 years. Fecal occult blood test (FOBT) of the stool. You may have this test every year starting at age 74. Flexible sigmoidoscopy or colonoscopy. You may have a sigmoidoscopy every 5 years or a colonoscopy every 10 years starting at age 50. Prostate cancer screening. Recommendations will vary depending on your family history and other risks. Hepatitis C blood test. Hepatitis B blood test. Sexually transmitted disease (STD) testing. Diabetes screening. This is done by checking your blood sugar (glucose) after you have not eaten for a while (fasting). You may have this done every 1-3 years. Abdominal aortic aneurysm (AAA) screening. You may need this if you are a current  or former smoker. Osteoporosis. You may be screened starting at age 21 if you are at high risk. Talk with your health care provider about your test results, treatment options, and if necessary, the need for more tests. Vaccines  Your health care provider may recommend certain vaccines, such as: Influenza vaccine. This is recommended every year. Tetanus, diphtheria, and  acellular pertussis (Tdap, Td) vaccine. You may need a Td booster every 10 years. Zoster vaccine. You may need this after age 19. Pneumococcal 13-valent conjugate (PCV13) vaccine. One dose is recommended after age 28. Pneumococcal polysaccharide (PPSV23) vaccine. One dose is recommended after age 62. Talk to your health care provider about which screenings and vaccines you need and how often you need them. This information is not intended to replace advice given to you by your health care provider. Make sure you discuss any questions you have with your health care provider. Document Released: 12/05/2015 Document Revised: 07/28/2016 Document Reviewed: 09/09/2015 Elsevier Interactive Patient Education  2017 [place] Prevention in the Home Falls can cause injuries. They can happen to people of all ages. There are many things you can do to make your home safe and to help prevent falls. What can I do on the outside of my home? Regularly fix the edges of walkways and driveways and fix any cracks. Remove anything that might make you trip as you walk through a door, such as a raised step or threshold. Trim any bushes or trees on the path to your home. Use bright outdoor lighting. Clear any walking paths of anything that might make someone trip, such as rocks or tools. Regularly check to see if handrails are loose or broken. Make sure that both sides of any steps have handrails. Any raised decks and porches should have guardrails on the edges. Have any leaves, snow, or ice cleared regularly. Use sand or salt on walking paths during winter. Clean up any spills in your garage right away. This includes oil or grease spills. What can I do in the bathroom? Use night lights. Install grab bars by the toilet and in the tub and shower. Do not use towel bars as grab bars. Use non-skid mats or decals in the tub or shower. If you need to sit down in the shower, use a plastic, non-slip stool. Keep  the floor dry. Clean up any water that spills on the floor as soon as it happens. Remove soap buildup in the tub or shower regularly. Attach bath mats securely with double-sided non-slip rug tape. Do not have throw rugs and other things on the floor that can make you trip. What can I do in the bedroom? Use night lights. Make sure that you have a light by your bed that is easy to reach. Do not use any sheets or blankets that are too big for your bed. They should not hang down onto the floor. Have a firm chair that has side arms. You can use this for support while you get dressed. Do not have throw rugs and other things on the floor that can make you trip. What can I do in the kitchen? Clean up any spills right away. Avoid walking on wet floors. Keep items that you use a lot in easy-to-reach places. If you need to reach something above you, use a strong step stool that has a grab bar. Keep electrical cords out of the way. Do not use floor polish or wax that makes floors slippery. If you must use wax,  use non-skid floor wax. Do not have throw rugs and other things on the floor that can make you trip. What can I do with my stairs? Do not leave any items on the stairs. Make sure that there are handrails on both sides of the stairs and use them. Fix handrails that are broken or loose. Make sure that handrails are as long as the stairways. Check any carpeting to make sure that it is firmly attached to the stairs. Fix any carpet that is loose or worn. Avoid having throw rugs at the top or bottom of the stairs. If you do have throw rugs, attach them to the floor with carpet tape. Make sure that you have a light switch at the top of the stairs and the bottom of the stairs. If you do not have them, ask someone to add them for you. What else can I do to help prevent falls? Wear shoes that: Do not have high heels. Have rubber bottoms. Are comfortable and fit you well. Are closed at the toe. Do not  wear sandals. If you use a stepladder: Make sure that it is fully opened. Do not climb a closed stepladder. Make sure that both sides of the stepladder are locked into place. Ask someone to hold it for you, if possible. Clearly mark and make sure that you can see: Any grab bars or handrails. First and last steps. Where the edge of each step is. Use tools that help you move around (mobility aids) if they are needed. These include: Canes. Walkers. Scooters. Crutches. Turn on the lights when you go into a dark area. Replace any light bulbs as soon as they burn out. Set up your furniture so you have a clear path. Avoid moving your furniture around. If any of your floors are uneven, fix them. If there are any pets around you, be aware of where they are. Review your medicines with your doctor. Some medicines can make you feel dizzy. This can increase your chance of falling. Ask your doctor what other things that you can do to help prevent falls. This information is not intended to replace advice given to you by your health care provider. Make sure you discuss any questions you have with your health care provider. Document Released: 09/04/2009 Document Revised: 04/15/2016 Document Reviewed: 12/13/2014 Elsevier Interactive Patient Education  2017 [employer].

## 2023-01-28 NOTE — Progress Notes (Addendum)
Subjective:   John Burns is a 82 y.o. male who presents for Medicare Annual/Subsequent preventive examination.  Review of Systems    No ROS.  Medicare Wellness Virtual Visit.  Visual/audio telehealth visit, UTA vital signs.   See social history for additional risk factors.   Cardiac Risk Factors include: hypertension;male gender;advanced age (>4mn, >>46women)     Objective:    Today's Vitals   01/28/23 1332  Weight: 160 lb (72.6 kg)  Height: '5\' 7"'$  (1.702 m)   Body mass index is 25.06 kg/m.     01/28/2023    1:33 PM 11/13/2021    9:08 AM 11/12/2020   10:13 AM 01/06/2020    4:28 PM 11/12/2019   10:30 AM 07/20/2018    3:24 PM 07/19/2016    3:46 PM  Advanced Directives  Does Patient Have a Medical Advance Directive? No No No No No No Yes  Type of Advance Directive       [facility] Does patient want to make changes to medical advance directive? No - Patient declined    No - Patient declined    [place]?       No - copy requested  Would patient like information on creating a medical advance directive?  No - Patient declined No - Patient declined No - Patient declined  No - Patient declined     Current Medications (verified) Outpatient Encounter Medications as of 01/28/2023  Medication Sig   amLODipine (NORVASC) 2.5 MG tablet Take 1 tablet (2.5 mg total) by mouth daily.   aspirin 81 MG tablet Take 1 tablet (81 mg total) by mouth daily.   atorvastatin (LIPITOR) 40 MG tablet Take 1 tablet (40 mg total) by mouth daily.   Calcium Carb-Cholecalciferol (CALCIUM-VITAMIN D) 500-200 MG-UNIT tablet Take 1 tablet by mouth daily.   donepezil (ARICEPT) 10 MG tablet Take 1 tablet (10 mg total) by mouth at bedtime.   levothyroxine (EUTHYROX) 50 MCG tablet Take 1 tablet (50 mcg total) by mouth daily.   nitroGLYCERIN (NITROSTAT) 0.4 MG SL tablet Place 1 tablet (0.4 mg total) under the tongue every 5 (five) minutes as needed for chest  pain.   omeprazole (PRILOSEC) 20 MG capsule Take 1 capsule (20 mg total) by mouth daily.   telmisartan (MICARDIS) 80 MG tablet Take 1 tablet (80 mg total) by mouth daily.   vitamin E 400 UNIT capsule Take 400 Units by mouth daily.   No facility-administered encounter medications on file as of 01/28/2023.    Allergies (verified) Patient has no known allergies.   History: Past Medical History:  Diagnosis Date   ASCVD (arteriosclerotic cardiovascular disease)    Bladder neck obstruction    CAD (coronary artery disease) 2007   s/p PCI to mid LAD   COPD (chronic obstructive pulmonary disease) (HCC)    Diverticulosis    GERD (gastroesophageal reflux disease)    [place]    History of hiatal hernia    History of tobacco abuse    HOH (hard of hearing)    Hypercholesteremia    Hypertension    Shortness of breath dyspnea    on exertion   Stenosis of coronary stent 2013   Past Surgical History:  Procedure Laterality Date   ANGIOPLASTY / STENTING FEMORAL     CARDIAC CATHETERIZATION  05/10/12   patent LAD stent w/ 50% in stent restenosis; 70% stenosis ostium of left circumflex; 75% stenosis mid left  circumflex   CATARACT EXTRACTION W/PHACO Left 12/29/2015   Procedure: CATARACT EXTRACTION PHACO AND INTRAOCULAR LENS PLACEMENT (IOC);  Surgeon: Estill Cotta, MD;  Location: [facility] ORS;  Service: Ophthalmology;  Laterality: Left;  [place]: 01:53.8 AP%: 26.2 CDE: 49.38 Lot # [id number]   CATARACT EXTRACTION W/PHACO Right 01/26/2016   Procedure: CATARACT EXTRACTION PHACO AND INTRAOCULAR LENS PLACEMENT (IOC);  Surgeon: Estill Cotta, MD;  Location: [facility] ORS;  Service: Ophthalmology;  Laterality: Right;  [place] 01:13 AP% 25.3 CDE 33.95 fluid pack lot # [id number]   COLONOSCOPY  02/05/14   CORONARY ARTERY BYPASS GRAFT  06/04/14   x2 Cecilia  2007   ESOPHAGOGASTRODUODENOSCOPY  02/05/14   Family History  Problem Relation Age of Onset   Heart disease Mother     Cancer Father 44       prostate and colon cancer   Social History   Socioeconomic History   Marital status: Married    Spouse name: Not on file   Number of children: Not on file   Years of education: Not on file   Highest education level: Not on file  Occupational History   Not on file  Tobacco Use   Smoking status: Former    Packs/day: 1.00    Years: 50.00    Total pack years: 50.00    Types: Cigarettes    Quit date: 11/22/1993    Years since quitting: 29.2   Smokeless tobacco: Former    Types: Chew   Tobacco comments:    used chew to help quit smoking X1 year.  Substance and Sexual Activity   Alcohol use: No   Drug use: No   Sexual activity: Yes  Other Topics Concern   Not on file  Social History Narrative   Not on file   Social Determinants of Health   Financial Resource Strain: Low Risk  (01/28/2023)   Overall Financial Resource Strain (CARDIA)    Difficulty of Paying Living Expenses: Not hard at all  Food Insecurity: No Food Insecurity (01/28/2023)   Hunger Vital Sign    Worried About Running Out of Food in the Last Year: Never true    Ran Out of Food in the Last Year: Never true  Transportation Needs: No Transportation Needs (01/28/2023)   PRAPARE - Hydrologist (Medical): No    Lack of Transportation (Non-Medical): No  Physical Activity: Not on file  Stress: No Stress Concern Present (01/28/2023)   [place]    Feeling of Stress : Not at all  Social Connections: Unknown (01/28/2023)   Social Connection and Isolation Panel [NHANES]    Frequency of Communication with Friends and Family: Not on file    Frequency of Social Gatherings with Friends and Family: Not on file    Attends Religious Services: Not on file    Active Member of Clubs or Organizations: Not on file    Attends Archivist Meetings: Not on file    Marital Status: Married    Tobacco  Counseling Counseling given: Not Answered Tobacco comments: used chew to help quit smoking X1 year.   Clinical Intake:  Pre-visit preparation completed: Yes        Diabetes: No  How often do you need to have someone help you when you read instructions, pamphlets, or other written materials from your doctor or pharmacy?: 1 - Never    Interpreter Needed?: No  Activities of Daily Living    01/28/2023    1:36 PM  In your present state of health, do you have any difficulty performing the following activities:  Hearing? 0  Vision? 0  Difficulty concentrating or making decisions? 1  Comment Taking medication as directed  Walking or climbing stairs? 0  Dressing or bathing? 0  Doing errands, shopping? 1  Comment Family/daughter Land and eating ? N  Using the Toilet? N  In the past six months, have you accidently leaked urine? N  Do you have problems with loss of bowel control? N  Managing your Medications? Y  Comment Family/daughter assist  Managing your Finances? Y  Comment Family/daughter assist  Housekeeping or managing your Housekeeping? N    Patient Care Team: Crecencio Mc, MD as PCP - General (Internal Medicine)  Indicate any recent Medical Services you may have received from other than [facility] providers in the past year (date may be approximate).     Assessment:   This is a routine wellness examination for [facility].  I connected with  Judie Bonus on 01/28/23 by a audio enabled telemedicine application and verified that I am speaking with the correct person using two identifiers.  Patient Location: Home  Provider Location: Office/Clinic  I discussed the limitations of evaluation and management by telemedicine. The patient expressed understanding and agreed to proceed.   Hearing/Vision screen Hearing Screening - Comments:: Patient is able to hear conversational tones without difficulty. No issues reported. Vision Screening -  Comments:: Followed by [facility] Wears corrective lenses Cataract extraction, bilateral They have regular follow up with the ophthalmologist   Dietary issues and exercise activities discussed: Current Exercise Habits: Home exercise routine, Type of exercise: walking, Intensity: Mild   Goals Addressed               This Visit's Progress     Patient Stated     Follow up with Primary Care Provider (pt-stated)        As needed.       Depression Screen    01/28/2023    1:51 PM 01/10/2023    8:42 AM 10/04/2022    1:42 PM 07/09/2022    8:10 AM 01/01/2022    8:39 AM 11/13/2021    9:07 AM 10/12/2021    3:43 PM  PHQ 2/9 Scores  PHQ - 2 Score 3 3 0 1 0 0 0  PHQ- 9 Score 4 4         Fall Risk    01/28/2023    1:36 PM 01/10/2023    8:42 AM 10/04/2022    1:42 PM 07/09/2022    8:10 AM 01/01/2022    8:39 AM  Fall Risk   Falls in the past year? 0 0 0 0 0  Number falls in past yr: 0 0     Injury with Fall? 0 0     Risk for fall due to :  No Fall Risks No Fall Risks No Fall Risks No Fall Risks  Follow up Falls evaluation completed;Falls prevention discussed Falls evaluation completed Falls evaluation completed Falls evaluation completed Falls evaluation completed    FALL RISK PREVENTION PERTAINING TO THE HOME: Home free of loose throw rugs in walkways, pet beds, electrical cords, etc? Yes  Adequate lighting in your home to reduce risk of falls? Yes   ASSISTIVE DEVICES UTILIZED TO PREVENT FALLS: Life alert? No  Use of a cane, walker or w/c? No  Grab  bars in the bathroom? Yes  Shower chair or bench in shower? Yes  Elevated toilet seat or a handicapped toilet? No  TIMED UP AND GO: Was the test performed? No .   Cognitive Function: Taking medication as directed. Followed by pcp.     11/13/2021    9:19 AM 07/20/2018    3:27 PM 07/19/2016    3:42 PM  MMSE - Mini Mental State Exam  Not completed: Unable to complete    Orientation to time  5 5  Orientation to Place   5 5  Registration  3 3  Attention/ Calculation  5 5  Recall  3 2  Language- name 2 objects  2 2  Language- repeat  1 1  Language- follow 3 step command  3 3  Language- read & follow direction  1 1  Write a sentence  1 1  Copy design  1 1  Total score  30 29        01/28/2023    1:38 PM 11/12/2020   10:14 AM 11/12/2019   10:32 AM 07/19/2017    3:39 PM  6CIT Screen  What Year? 0 points 0 points 0 points 0 points  What month? 0 points 0 points 0 points 0 points  What time? 0 points 0 points 0 points 0 points  Count back from 20 0 points 0 points 0 points 0 points  Months in reverse 4 points 0 points 2 points 0 points  Repeat phrase 10 points 10 points 4 points 0 points  Total Score 14 points 10 points 6 points 0 points    Immunizations Immunization History  Administered Date(s) Administered   Fluad Quad(high Dose 65+) 08/20/2019, 09/16/2020, 09/09/2021, 09/02/2022   Influenza Split 09/26/2013   Influenza, High Dose Seasonal PF 07/28/2016, 08/23/2017, 09/05/2018   Influenza,inj,Quad PF,6+ Mos 08/28/2014, 09/30/2015   Moderna Sars-Covid-2 Vaccination 12/05/2019, 01/02/2020, 10/22/2020   Pneumococcal Conjugate-13 12/18/2014   Pneumococcal Polysaccharide-23 11/26/2013   Tdap 11/21/2009   Zoster, Live 11/21/2004   TDAP status: Due, Education has been provided regarding the importance of this vaccine. Advised may receive this vaccine at local pharmacy or Health Dept. Aware to provide a copy of the vaccination record if obtained from local pharmacy or Health Dept. Verbalized acceptance and understanding.  Screening Tests Health Maintenance  Topic Date Due   DTaP/Tdap/Td (2 - Td or Tdap) 11/22/2019   COVID-19 Vaccine (4 - 2023-24 season) 02/13/2023 (Originally 07/23/2022)   Zoster Vaccines- Shingrix (1 of 2) 04/10/2023 (Originally 11/10/1990)   Medicare Annual Wellness (AWV)  01/28/2024   Pneumonia Vaccine 52+ Years old  Completed   INFLUENZA VACCINE  Completed   HPV VACCINES   Aged Out   COLONOSCOPY (Pts 45-14yr Insurance coverage will need to be confirmed)  Discontinued    Health Maintenance Health Maintenance Due  Topic Date Due   DTaP/Tdap/Td (2 - Td or Tdap) 11/22/2019   Hepatitis C Screening: does not qualify.  Vision Screening: Recommended annual ophthalmology exams for early detection of glaucoma and other disorders of the eye.  Dental Screening: Recommended annual dental exams for proper oral hygiene  Community Resource Referral / Chronic Care Management: CRR required this visit?  No   CCM required this visit?  No      Plan:     I have personally reviewed and noted the following in the patient's chart:   Medical and social history Use of alcohol, tobacco or illicit drugs  Current medications and supplements including opioid prescriptions. Patient  is not currently taking opioid prescriptions. Functional ability and status Nutritional status Physical activity Advanced directives List of other physicians Hospitalizations, surgeries, and ER visits in previous 12 months Vitals Screenings to include cognitive, depression, and falls Referrals and appointments  In addition, I have reviewed and discussed with patient certain preventive protocols, quality metrics, and best practice recommendations. A written personalized care plan for preventive services as well as general preventive health recommendations were provided to patient.     [place], LPN   [phone]    I have reviewed the above information and agree with above.   Deborra Medina, MD

## 2023-02-08 ENCOUNTER — Ambulatory Visit (INDEPENDENT_AMBULATORY_CARE_PROVIDER_SITE_OTHER): Payer: Medicare Other

## 2023-02-08 ENCOUNTER — Encounter: Payer: Self-pay | Admitting: Internal Medicine

## 2023-02-08 DIAGNOSIS — I10 Essential (primary) hypertension: Secondary | ICD-10-CM

## 2023-02-08 DIAGNOSIS — E538 Deficiency of other specified B group vitamins: Secondary | ICD-10-CM | POA: Diagnosis not present

## 2023-02-08 MED ORDER — CYANOCOBALAMIN 1000 MCG/ML IJ SOLN
1000.0000 ug | Freq: Once | INTRAMUSCULAR | Status: AC
  Administered 2023-02-08: 1000 ug via INTRAMUSCULAR

## 2023-02-08 NOTE — Progress Notes (Signed)
Patient arrived for a B12 injection and it was administered into his left deltoid. Patient tolerated the injection well and did not show any signs of distress or voice any concerns.

## 2023-02-09 ENCOUNTER — Other Ambulatory Visit: Payer: Self-pay | Admitting: Internal Medicine

## 2023-02-09 MED ORDER — AMLODIPINE BESYLATE 2.5 MG PO TABS: 5.0000 mg | ORAL_TABLET | Freq: Every day | ORAL | 0 refills | Status: DC

## 2023-02-09 NOTE — Assessment & Plan Note (Signed)
Home readings from 2/20 to 3/18 are generally above goal.  Advised to increase amlodipine to 5 mg daily

## 2023-03-14 ENCOUNTER — Ambulatory Visit: Payer: Medicare Other

## 2023-03-17 ENCOUNTER — Ambulatory Visit (INDEPENDENT_AMBULATORY_CARE_PROVIDER_SITE_OTHER): Payer: Medicare Other

## 2023-03-17 DIAGNOSIS — E538 Deficiency of other specified B group vitamins: Secondary | ICD-10-CM

## 2023-03-17 MED ORDER — CYANOCOBALAMIN 1000 MCG/ML IJ SOLN
1000.0000 ug | Freq: Once | INTRAMUSCULAR | Status: AC
  Administered 2023-03-17: 1000 ug via INTRAMUSCULAR

## 2023-03-17 NOTE — Progress Notes (Signed)
After obtaining consent, and per orders of Dr. Clent Ridges, injection of B-12 given IM in right deltoid by Valentino Nose. Patient tolerated injection well.

## 2023-04-04 ENCOUNTER — Other Ambulatory Visit: Payer: Self-pay | Admitting: Internal Medicine

## 2023-04-07 ENCOUNTER — Other Ambulatory Visit: Payer: Self-pay | Admitting: Internal Medicine

## 2023-04-13 ENCOUNTER — Encounter: Payer: Self-pay | Admitting: Internal Medicine

## 2023-04-19 ENCOUNTER — Ambulatory Visit (INDEPENDENT_AMBULATORY_CARE_PROVIDER_SITE_OTHER): Payer: Medicare Other

## 2023-04-19 DIAGNOSIS — E538 Deficiency of other specified B group vitamins: Secondary | ICD-10-CM

## 2023-04-19 MED ORDER — CYANOCOBALAMIN 1000 MCG/ML IJ SOLN
1000.0000 ug | Freq: Once | INTRAMUSCULAR | Status: AC
Start: 2023-04-19 — End: 2023-04-19
  Administered 2023-04-19: 1000 ug via INTRAMUSCULAR

## 2023-04-19 NOTE — Progress Notes (Signed)
After obtaining consent, and per orders of Dr. Tullo, injection of B-12 given IM in left deltoid by Alexiana Laverdure Lynn. Patient tolerated injection well.  

## 2023-05-23 ENCOUNTER — Ambulatory Visit (INDEPENDENT_AMBULATORY_CARE_PROVIDER_SITE_OTHER): Payer: Medicare Other

## 2023-05-23 DIAGNOSIS — E538 Deficiency of other specified B group vitamins: Secondary | ICD-10-CM | POA: Diagnosis not present

## 2023-05-23 MED ORDER — CYANOCOBALAMIN 1000 MCG/ML IJ SOLN
1000.0000 ug | Freq: Once | INTRAMUSCULAR | Status: AC
  Administered 2023-05-23: 1000 ug via INTRAMUSCULAR

## 2023-05-23 NOTE — Progress Notes (Signed)
Pt presented for their vitamin B12 injection. Pt was identified through two identifiers. Pt tolerated shot well in their right deltoid.  

## 2023-06-01 DIAGNOSIS — I471 Supraventricular tachycardia, unspecified: Secondary | ICD-10-CM | POA: Diagnosis not present

## 2023-06-01 DIAGNOSIS — I25708 Atherosclerosis of coronary artery bypass graft(s), unspecified, with other forms of angina pectoris: Secondary | ICD-10-CM | POA: Diagnosis not present

## 2023-06-01 DIAGNOSIS — Z951 Presence of aortocoronary bypass graft: Secondary | ICD-10-CM | POA: Diagnosis not present

## 2023-06-22 ENCOUNTER — Encounter (INDEPENDENT_AMBULATORY_CARE_PROVIDER_SITE_OTHER): Payer: Self-pay

## 2023-06-22 DIAGNOSIS — I700 Atherosclerosis of aorta: Secondary | ICD-10-CM | POA: Diagnosis not present

## 2023-06-22 DIAGNOSIS — M47816 Spondylosis without myelopathy or radiculopathy, lumbar region: Secondary | ICD-10-CM | POA: Diagnosis not present

## 2023-06-22 DIAGNOSIS — M549 Dorsalgia, unspecified: Secondary | ICD-10-CM | POA: Diagnosis not present

## 2023-06-22 DIAGNOSIS — M5136 Other intervertebral disc degeneration, lumbar region: Secondary | ICD-10-CM | POA: Diagnosis not present

## 2023-06-22 DIAGNOSIS — M5416 Radiculopathy, lumbar region: Secondary | ICD-10-CM | POA: Diagnosis not present

## 2023-06-23 ENCOUNTER — Ambulatory Visit (INDEPENDENT_AMBULATORY_CARE_PROVIDER_SITE_OTHER): Payer: Medicare Other | Admitting: *Deleted

## 2023-06-23 DIAGNOSIS — E538 Deficiency of other specified B group vitamins: Secondary | ICD-10-CM

## 2023-06-23 MED ORDER — CYANOCOBALAMIN 1000 MCG/ML IJ SOLN
1000.0000 ug | Freq: Once | INTRAMUSCULAR | Status: AC
Start: 2023-06-23 — End: 2023-06-23
  Administered 2023-06-23: 1000 ug via INTRAMUSCULAR

## 2023-06-23 NOTE — Progress Notes (Signed)
Per orders of Bethanie Dicker, FNP, injection of B-12 given by Vertis Kelch in left deltoid. Patient tolerated injection well.

## 2023-07-06 DIAGNOSIS — M47816 Spondylosis without myelopathy or radiculopathy, lumbar region: Secondary | ICD-10-CM | POA: Diagnosis not present

## 2023-07-07 ENCOUNTER — Encounter (INDEPENDENT_AMBULATORY_CARE_PROVIDER_SITE_OTHER): Payer: Self-pay

## 2023-07-11 ENCOUNTER — Ambulatory Visit: Payer: Medicare Other | Admitting: Internal Medicine

## 2023-07-11 ENCOUNTER — Encounter: Payer: Self-pay | Admitting: Internal Medicine

## 2023-07-11 VITALS — BP 180/88 | HR 53 | Temp 97.4°F | Ht 67.0 in | Wt 160.4 lb

## 2023-07-11 DIAGNOSIS — E039 Hypothyroidism, unspecified: Secondary | ICD-10-CM | POA: Diagnosis not present

## 2023-07-11 DIAGNOSIS — F01A4 Vascular dementia, mild, with anxiety: Secondary | ICD-10-CM | POA: Diagnosis not present

## 2023-07-11 DIAGNOSIS — M5136 Other intervertebral disc degeneration, lumbar region: Secondary | ICD-10-CM | POA: Diagnosis not present

## 2023-07-11 DIAGNOSIS — M48062 Spinal stenosis, lumbar region with neurogenic claudication: Secondary | ICD-10-CM | POA: Diagnosis not present

## 2023-07-11 DIAGNOSIS — K22719 Barrett's esophagus with dysplasia, unspecified: Secondary | ICD-10-CM

## 2023-07-11 DIAGNOSIS — E785 Hyperlipidemia, unspecified: Secondary | ICD-10-CM

## 2023-07-11 DIAGNOSIS — M62830 Muscle spasm of back: Secondary | ICD-10-CM | POA: Diagnosis not present

## 2023-07-11 DIAGNOSIS — M47816 Spondylosis without myelopathy or radiculopathy, lumbar region: Secondary | ICD-10-CM | POA: Diagnosis not present

## 2023-07-11 DIAGNOSIS — M5126 Other intervertebral disc displacement, lumbar region: Secondary | ICD-10-CM | POA: Diagnosis not present

## 2023-07-11 DIAGNOSIS — I10 Essential (primary) hypertension: Secondary | ICD-10-CM | POA: Diagnosis not present

## 2023-07-11 DIAGNOSIS — M5416 Radiculopathy, lumbar region: Secondary | ICD-10-CM | POA: Diagnosis not present

## 2023-07-11 LAB — LIPID PANEL
Cholesterol: 137 mg/dL (ref 0–200)
HDL: 45.7 mg/dL (ref >39.00)
LDL Cholesterol: 68 mg/dL (ref 0–99)
NonHDL: 91.54
Total CHOL/HDL Ratio: 3
Triglycerides: 119 mg/dL (ref 0.0–149.0)
VLDL: 23.8 mg/dL (ref 0.0–40.0)

## 2023-07-11 LAB — COMPREHENSIVE METABOLIC PANEL
ALT: 8 U/L (ref 0–53)
AST: 13 U/L (ref 0–37)
Albumin: 4.1 g/dL (ref 3.5–5.2)
Alkaline Phosphatase: 107 U/L (ref 39–117)
BUN: 11 mg/dL (ref 6–23)
CO2: 26 mEq/L (ref 19–32)
Calcium: 9.5 mg/dL (ref 8.4–10.5)
Chloride: 105 mEq/L (ref 96–112)
Creatinine, Ser: 1.02 mg/dL (ref 0.40–1.50)
GFR: 68.37 mL/min (ref >60.00)
Glucose, Bld: 90 mg/dL (ref 70–99)
Potassium: 3.8 mEq/L (ref 3.5–5.1)
Sodium: 139 mEq/L (ref 135–145)
Total Bilirubin: 0.8 mg/dL (ref 0.2–1.2)
Total Protein: 6.2 g/dL (ref 6.0–8.3)

## 2023-07-11 LAB — LDL CHOLESTEROL, DIRECT: Direct LDL: 71 mg/dL

## 2023-07-11 LAB — TSH: TSH: 3.54 u[IU]/mL (ref 0.35–5.50)

## 2023-07-11 MED ORDER — AMLODIPINE BESYLATE 5 MG PO TABS: ORAL_TABLET | ORAL | 3 refills | Status: DC

## 2023-07-11 MED ORDER — DONEPEZIL HCL 10 MG PO TABS: 10.0000 mg | ORAL_TABLET | Freq: Every day | ORAL | 1 refills | Status: DC

## 2023-07-11 MED ORDER — AMLODIPINE BESYLATE 2.5 MG PO TABS: ORAL_TABLET | ORAL | 0 refills | Status: DC

## 2023-07-11 MED ORDER — LEVOTHYROXINE SODIUM 50 MCG PO TABS: 50.0000 ug | ORAL_TABLET | Freq: Every day | ORAL | 1 refills | Status: DC

## 2023-07-11 MED ORDER — TELMISARTAN 80 MG PO TABS: 80.0000 mg | ORAL_TABLET | Freq: Every day | ORAL | 3 refills | Status: DC

## 2023-07-11 NOTE — Assessment & Plan Note (Signed)
Increase amlodipine to 5 mg daily.  Continue telmisartan 80 mg daily .  He has been asked to check her  BP  at home and  submit readings for evaluation. Renal function, electrolytes and screen for proteinuria are all normal .[id number]

## 2023-07-11 NOTE — Patient Instructions (Addendum)
I Have increased your  amlodipine dose to 5 mg daily and continue the telmisartan  80 mg daily    PLEASE GO GET YOUR TETANUS  VACCINE ASAP!     The ShingRx vaccine is now available  for free at your pharmacy and ADVISED for all interested adults over 50 to prevent shingles.  PLEASE GET YOUR FIRST DOSE THIS MONTH AND YOUR SECOND DOSE BEFORE THE END OF FEBRUARY      You can add up to 2000 mg of acetominophen (tylenol) every day safely  for back pain In divided doses (500 mg every 6 hours  Or 1000 mg every 12 hours.)    Change the omeprazole to bedtime so it gets absorbed better

## 2023-07-11 NOTE — Assessment & Plan Note (Signed)
Continue omeprazole, change administration to evening .  No symptoms of cough, reflux or dysphagia

## 2023-07-11 NOTE — Assessment & Plan Note (Signed)
His symptoms have not progressed , and he is tolerating aricept  .  His daughters are involved in his care since his wife has been unable to assume more responsibilitY . HE HAS BEEN ADVISED TO LIMIT DRIVING AS FAMILY has become concerned

## 2023-07-11 NOTE — Progress Notes (Signed)
Subjective:  Patient ID: John Burns, male    DOB: May 19, 1941  Age: 82 y.o. MRN: [id number]  CC: The primary encounter diagnosis was Essential hypertension. Diagnoses of Hypothyroidism (acquired), Hyperlipidemia, unspecified hyperlipidemia type, Mild vascular dementia with anxiety (HCC), and Barrett's esophagus with dysplasia were also pertinent to this visit.   HPI John Burns presents for  Chief Complaint  Patient presents with   Medical Management of Chronic Issues    6 month follow up      2) HTN: taking amlodipine 2.5 and telmisartan  80 mg daily.  Not checking home readings   3) Dementia: getting worse per daughter    4) Back pain:  had an ESI last week by Myer Haff  not sure if it helped.   5) Barrett's esophagus:  discussed taking omeprazole at night  due to thyrodi medication  in the am   6) SEES DINGLEDEIN  ONCE A YEAR   .     Outpatient Medications Prior to Visit  Medication Sig Dispense Refill   aspirin 81 MG tablet Take 1 tablet (81 mg total) by mouth daily. 90 tablet 1   atorvastatin (LIPITOR) 40 MG tablet Take 1 tablet (40 mg total) by mouth daily. 90 tablet 3   Calcium Carb-Cholecalciferol (CALCIUM-VITAMIN D) 500-200 MG-UNIT tablet Take 1 tablet by mouth daily.     nitroGLYCERIN (NITROSTAT) 0.4 MG SL tablet Place 1 tablet (0.4 mg total) under the tongue every 5 (five) minutes as needed for chest pain. 50 tablet 3   omeprazole (PRILOSEC) 20 MG capsule Take 1 capsule (20 mg total) by mouth daily. 90 capsule 3   vitamin E 400 UNIT capsule Take 400 Units by mouth daily.     amLODipine (NORVASC) 2.5 MG tablet TAKE 1 TABLET(2.5 MG) BY MOUTH DAILY 90 tablet 0   donepezil (ARICEPT) 10 MG tablet Take 1 tablet (10 mg total) by mouth at bedtime. 90 tablet 1   levothyroxine (EUTHYROX) 50 MCG tablet Take 1 tablet (50 mcg total) by mouth daily. 90 tablet 1   telmisartan (MICARDIS) 80 MG tablet TAKE 1 TABLET(80 MG) BY MOUTH DAILY 90 tablet 1   No  facility-administered medications prior to visit.    Review of Systems;  Patient denies headache, fevers, malaise, unintentional weight loss, skin rash, eye pain, sinus congestion and sinus pain, sore throat, dysphagia,  hemoptysis , cough, dyspnea, wheezing, chest pain, palpitations, orthopnea, edema, abdominal pain, nausea, melena, diarrhea, constipation, flank pain, dysuria, hematuria, urinary  Frequency, nocturia, numbness, tingling, seizures,  Focal weakness, Loss of consciousness,  Tremor, insomnia, depression, anxiety, and suicidal ideation.      Objective:  BP (!) 180/88   Pulse (!) 53   Temp (!) 97.4 F (36.3 C) (Oral)   Ht 5\' 7"  (1.702 m)   Wt 160 lb 6.4 oz (72.8 kg)   SpO2 98%   BMI 25.12 kg/m   BP Readings from Last 3 Encounters:  07/11/23 (!) 180/88  01/10/23 (!) 172/72  11/01/22 125/68    Wt Readings from Last 3 Encounters:  07/11/23 160 lb 6.4 oz (72.8 kg)  01/28/23 160 lb (72.6 kg)  01/10/23 160 lb 3.2 oz (72.7 kg)    Physical Exam Vitals reviewed.  Constitutional:      General: He is not in acute distress.    Appearance: Normal appearance. He is normal weight. He is not ill-appearing, toxic-appearing or diaphoretic.  HENT:     Head: Normocephalic.  Eyes:     General: No scleral  icterus.       Right eye: No discharge.        Left eye: No discharge.     Conjunctiva/sclera: Conjunctivae normal.  Cardiovascular:     Rate and Rhythm: Normal rate and regular rhythm.     Heart sounds: Normal heart sounds.  Pulmonary:     Effort: Pulmonary effort is normal. No respiratory distress.     Breath sounds: Normal breath sounds.  Musculoskeletal:        General: Normal range of motion.     Cervical back: Normal range of motion.  Skin:    General: Skin is warm and dry.  Neurological:     General: No focal deficit present.     Mental Status: He is alert and oriented to person, place, and time. Mental status is at baseline.  Psychiatric:        Mood and  Affect: Mood normal.        Behavior: Behavior normal.        Thought Content: Thought content normal.        Judgment: Judgment normal.   Lab Results  Component Value Date   HGBA1C 5.5 01/10/2023   HGBA1C 6.2 07/09/2022   HGBA1C 5.9 01/01/2022    Lab Results  Component Value Date   CREATININE 1.02 07/11/2023   CREATININE 1.06 01/10/2023   CREATININE 1.25 11/01/2022    Lab Results  Component Value Date   WBC 6.2 01/10/2023   HGB 14.0 01/10/2023   HCT 42.2 01/10/2023   PLT 164.0 01/10/2023   GLUCOSE 90 07/11/2023   CHOL 137 07/11/2023   TRIG 119.0 07/11/2023   HDL 45.70 07/11/2023   LDLDIRECT 71.0 07/11/2023   LDLCALC 68 07/11/2023   ALT 8 07/11/2023   AST 13 07/11/2023   NA 139 07/11/2023   K 3.8 07/11/2023   CL 105 07/11/2023   CREATININE 1.02 07/11/2023   BUN 11 07/11/2023   CO2 26 07/11/2023   TSH 3.54 07/11/2023   PSA 2.33 05/22/2015   HGBA1C 5.5 01/10/2023   MICROALBUR 0.9 01/10/2023    MR LUMBAR SPINE WO CONTRAST  Result Date: 06/14/2022 CLINICAL DATA:  Lumbar radiculitis. Chronic low back pain with left-sided sciatica. EXAM: MRI LUMBAR SPINE WITHOUT CONTRAST TECHNIQUE: Multiplanar, multisequence MR imaging of the lumbar spine was performed. No intravenous contrast was administered. COMPARISON:  Lumbar spine MRI 04/18/2020 FINDINGS: Segmentation:  Standard. Alignment:  Unchanged trace retrolisthesis of L2 on L3. Vertebrae: No fracture, suspicious marrow lesion, or significant marrow edema. Chronic degenerative endplate changes at T11-12, L2-3, and L5-S1. Conus medullaris and cauda equina: Conus extends to the L1-2 level. Unchanged small lipoma of the filum. Paraspinal and other soft tissues: Left renal sinus cysts. Disc levels: Disc desiccation throughout the lumbar and lower thoracic spine. Unchanged severe disc space narrowing at T11-12 and L5-S1 and moderate narrowing at L2-3. T12-L1: No disc herniation or stenosis. L1-2: Disc bulging results in mild left  neural foraminal stenosis without spinal stenosis, unchanged. L2-3: Disc bulging, a chronic left subarticular disc extrusion with mild cranial migration, and mild facet hypertrophy result in severe left lateral recess and moderate left neural foraminal stenosis with potential left L2 and L3 nerve root impingement. The morphology of the disc extrusion is mildly different than in 2021, overall appearing perhaps slightly smaller but with slightly greater medial extent in the lateral recess. The overall degree of stenosis is similar to the prior MRI. Borderline spinal stenosis. L3-4: Mild disc bulging with central and left foraminal annular  fissures and mild facet hypertrophy, unchanged. No significant stenosis. L4-5: Disc bulging, a right central disc protrusion with annular fissure, and mild facet hypertrophy result in mild bilateral lateral recess stenosis and borderline spinal stenosis without neural foraminal stenosis, unchanged. L5-S1: Circumferential disc osteophyte complex and mild facet hypertrophy result in minimal bilateral lateral recess stenosis and moderate bilateral neural foraminal stenosis without spinal stenosis, unchanged. IMPRESSION: Multilevel lumbar disc and facet degeneration without significant interval change, most notable at L2-3 where a chronic disc extrusion results in left lateral recess and left neural foraminal stenosis. Electronically Signed   By: Sebastian Ache M.D.   On: 06/14/2022 18:25    Assessment & Plan:  .Essential hypertension Assessment & Plan: Increase amlodipine to 5 mg daily.  Continue telmisartan 80 mg daily .  He has been asked to check her  BP  at home and  submit readings for evaluation. Renal function, electrolytes and screen for proteinuria are all normal .[id number]   Orders: -     Comprehensive metabolic panel  Hypothyroidism (acquired) -     TSH  Hyperlipidemia, unspecified hyperlipidemia type -     Lipid panel -     LDL cholesterol, direct  Mild  vascular dementia with anxiety [facility]) Assessment & Plan: His symptoms have not progressed , and he is tolerating aricept  .  His daughters are involved in his care since his wife has been unable to assume more responsibilitY . HE HAS BEEN ADVISED TO LIMIT DRIVING AS FAMILY has become concerned    Barrett's esophagus with dysplasia Assessment & Plan: Continue omeprazole, change administration to evening .  No symptoms of cough, reflux or dysphagia    Other orders -     Donepezil HCl; Take 1 tablet (10 mg total) by mouth at bedtime.  Dispense: 90 tablet; Refill: 1 -     Levothyroxine Sodium; Take 1 tablet (50 mcg total) by mouth daily.  Dispense: 90 tablet; Refill: 1 -     Telmisartan; Take 1 tablet (80 mg total) by mouth daily.  Dispense: 90 tablet; Refill: 3 -     amLODIPine Besylate; TAKE 1 TABLET(2.5 MG) BY MOUTH DAILY  Dispense: 90 tablet; Refill: 3     I provided 30 minutes of face-to-face time during this encounter reviewing patient's last visit with me, patient's  most recent visit with cardiology,  physiatry ,   recent surgical and non surgical procedures, previous  labs and imaging studies, counseling on currently addressed issues,  and post visit ordering to diagnostics and therapeutics .   Follow-up: Return in about 6 months (around 01/11/2024) for hypertension.   Sherlene Shams, MD

## 2023-07-26 DIAGNOSIS — M47816 Spondylosis without myelopathy or radiculopathy, lumbar region: Secondary | ICD-10-CM | POA: Diagnosis not present

## 2023-07-28 ENCOUNTER — Ambulatory Visit: Payer: Medicare Other

## 2023-08-04 ENCOUNTER — Ambulatory Visit: Payer: Medicare Other

## 2023-08-08 ENCOUNTER — Ambulatory Visit (INDEPENDENT_AMBULATORY_CARE_PROVIDER_SITE_OTHER): Payer: Medicare Other

## 2023-08-08 DIAGNOSIS — E538 Deficiency of other specified B group vitamins: Secondary | ICD-10-CM | POA: Diagnosis not present

## 2023-08-08 MED ORDER — CYANOCOBALAMIN 1000 MCG/ML IJ SOLN
1000.0000 ug | Freq: Once | INTRAMUSCULAR | Status: AC
Start: 2023-08-08 — End: 2023-08-08
  Administered 2023-08-08: 1000 ug via INTRAMUSCULAR

## 2023-08-08 NOTE — Progress Notes (Signed)
Pt presented for their vitamin B12 injection. Pt was identified through two identifiers. Pt tolerated shot well in their left  deltoid.

## 2023-09-06 DIAGNOSIS — M5126 Other intervertebral disc displacement, lumbar region: Secondary | ICD-10-CM | POA: Diagnosis not present

## 2023-09-06 DIAGNOSIS — M48062 Spinal stenosis, lumbar region with neurogenic claudication: Secondary | ICD-10-CM | POA: Diagnosis not present

## 2023-09-06 DIAGNOSIS — M5416 Radiculopathy, lumbar region: Secondary | ICD-10-CM | POA: Diagnosis not present

## 2023-09-06 DIAGNOSIS — M62830 Muscle spasm of back: Secondary | ICD-10-CM | POA: Diagnosis not present

## 2023-09-06 DIAGNOSIS — M51362 Other intervertebral disc degeneration, lumbar region with discogenic back pain and lower extremity pain: Secondary | ICD-10-CM | POA: Diagnosis not present

## 2023-09-07 ENCOUNTER — Ambulatory Visit: Payer: Medicare Other

## 2023-09-07 DIAGNOSIS — E538 Deficiency of other specified B group vitamins: Secondary | ICD-10-CM | POA: Diagnosis not present

## 2023-09-07 MED ORDER — CYANOCOBALAMIN 1000 MCG/ML IJ SOLN
1000.0000 ug | Freq: Once | INTRAMUSCULAR | Status: AC
Start: 2023-09-07 — End: 2023-09-07
  Administered 2023-09-07: 1000 ug via INTRAMUSCULAR

## 2023-09-07 NOTE — Progress Notes (Signed)
Patient presented for B 12 injection to left deltoid, patient voiced no concerns nor showed any signs of distress during injection. 

## 2023-09-27 ENCOUNTER — Telehealth: Payer: Self-pay | Admitting: Internal Medicine

## 2023-09-27 ENCOUNTER — Other Ambulatory Visit: Payer: Self-pay | Admitting: Internal Medicine

## 2023-09-27 DIAGNOSIS — I10 Essential (primary) hypertension: Secondary | ICD-10-CM

## 2023-09-27 MED ORDER — AMLODIPINE BESYLATE 10 MG PO TABS: 10.0000 mg | ORAL_TABLET | Freq: Every day | ORAL | 1 refills | Status: DC

## 2023-09-27 NOTE — Telephone Encounter (Signed)
LMTCB with pt's daughter Mrs. Norris.

## 2023-09-27 NOTE — Telephone Encounter (Signed)
Spoke with pt's daughter because they were requesting a refill on the amlodpine 5 mg. She stated they are out because at last office visit they were advised to increase the dose to two tablets daily. Looking in the chart at the last office note pt was currently taking 2.5 mg amlodipine and advised to increaser that by two tablets for a total of 5 mg daily. New rx for the 5 mg was sent in and filled. Daughter has been giving pt two of the 5 mg tablets daily(10 mg). Asked daughter if pt is having any symptoms and she stated that he has not said anything to her but that he stays tired all the time. They do not check bp regularly so the only blood pressure reading they have since being on the 10 mg is from a doctors visit on 09/06/2023 and the BP was 157/78 HR 66. Does pt need to continue with taking the 10 mg or reduce back down to the 5 mg dose?

## 2023-09-27 NOTE — Telephone Encounter (Signed)
Prescription Request  09/27/2023  LOV: 07/11/2023  What is the name of the medication or equipment? amLODipine   Have you contacted your pharmacy to request a refill? No   Which pharmacy would you like this sent to?  [id number] [place], [place] - [address] AT [facility] OF [address] [place] [address] Phone: [phone] Fax: [phone]    Patient notified that their request is being sent to the clinical staff for review and that they should receive a response within 2 business days.   Please advise at Mobile [phone] (mobile)   Pt daughter called stating the provider was going to increase his medication to 10mg  so he has been 2  5mg 

## 2023-09-27 NOTE — Assessment & Plan Note (Signed)
Dose of amlodipine increased again ,  to 10 mg daily .  RN visit for BP check needed.

## 2023-09-28 NOTE — Telephone Encounter (Signed)
Pt's daughter is aware and gave a verbal understanding.  

## 2023-10-05 NOTE — Telephone Encounter (Signed)
Error

## 2023-10-10 ENCOUNTER — Ambulatory Visit: Payer: Medicare Other

## 2023-10-10 VITALS — BP 130/66 | HR 53

## 2023-10-10 DIAGNOSIS — Z23 Encounter for immunization: Secondary | ICD-10-CM | POA: Diagnosis not present

## 2023-10-10 DIAGNOSIS — I10 Essential (primary) hypertension: Secondary | ICD-10-CM

## 2023-10-10 DIAGNOSIS — E538 Deficiency of other specified B group vitamins: Secondary | ICD-10-CM | POA: Diagnosis not present

## 2023-10-10 MED ORDER — CYANOCOBALAMIN 1000 MCG/ML IJ SOLN
1000.0000 ug | Freq: Once | INTRAMUSCULAR | Status: AC
Start: 2023-10-10 — End: 2023-10-10
  Administered 2023-10-10: 1000 ug via INTRAMUSCULAR

## 2023-10-10 NOTE — Progress Notes (Signed)
Pt presented for BP check, flu shot , and B12 shot. Pt was identified through two identifiers. After going over med list pt confirmed that he takes: amLODipine (NORVASC) 10 MG  &  telmisartan (MICARDIS) 80 MG   Pt denies: Headache, SOB, chest pain, nausea, blurred vision, numbness or tingling    Pt tolerated B12 shot well in their left deltoid.  Pt was offered the high dose flu vaccine due to age being 65+, pt tolerated injection well in the right deltoid.    After giving injections I let pt rest for about 5-6 minutes before checking BP. BP: 140/66 P: 56   After resting for another 5-6 minutes. BP: 130/66  P: 53    Pt was then discharged, he had no complaints or symptoms.

## 2023-11-11 ENCOUNTER — Ambulatory Visit (INDEPENDENT_AMBULATORY_CARE_PROVIDER_SITE_OTHER): Payer: Medicare Other

## 2023-11-11 DIAGNOSIS — E538 Deficiency of other specified B group vitamins: Secondary | ICD-10-CM

## 2023-11-11 MED ORDER — CYANOCOBALAMIN 1000 MCG/ML IJ SOLN
1000.0000 ug | Freq: Once | INTRAMUSCULAR | Status: AC
Start: 2023-11-11 — End: 2023-11-11
  Administered 2023-11-11: 1000 ug via INTRAMUSCULAR

## 2023-11-11 NOTE — Progress Notes (Signed)
Patient presented for B 12 injection to left deltoid, patient voiced no concerns nor showed any signs of distress during injection. 

## 2023-11-24 ENCOUNTER — Other Ambulatory Visit: Payer: Self-pay | Admitting: Internal Medicine

## 2023-11-30 DIAGNOSIS — R0602 Shortness of breath: Secondary | ICD-10-CM | POA: Diagnosis not present

## 2023-11-30 DIAGNOSIS — Z951 Presence of aortocoronary bypass graft: Secondary | ICD-10-CM | POA: Diagnosis not present

## 2023-11-30 DIAGNOSIS — I471 Supraventricular tachycardia, unspecified: Secondary | ICD-10-CM | POA: Diagnosis not present

## 2023-11-30 DIAGNOSIS — R001 Bradycardia, unspecified: Secondary | ICD-10-CM | POA: Diagnosis not present

## 2023-11-30 DIAGNOSIS — Z87891 Personal history of nicotine dependence: Secondary | ICD-10-CM | POA: Diagnosis not present

## 2023-11-30 DIAGNOSIS — I25708 Atherosclerosis of coronary artery bypass graft(s), unspecified, with other forms of angina pectoris: Secondary | ICD-10-CM | POA: Diagnosis not present

## 2023-11-30 DIAGNOSIS — I2510 Atherosclerotic heart disease of native coronary artery without angina pectoris: Secondary | ICD-10-CM | POA: Diagnosis not present

## 2023-12-13 ENCOUNTER — Ambulatory Visit (INDEPENDENT_AMBULATORY_CARE_PROVIDER_SITE_OTHER): Payer: Medicare Other

## 2023-12-13 DIAGNOSIS — E538 Deficiency of other specified B group vitamins: Secondary | ICD-10-CM

## 2023-12-13 MED ORDER — CYANOCOBALAMIN 1000 MCG/ML IJ SOLN
1000.0000 ug | Freq: Once | INTRAMUSCULAR | Status: AC
Start: 2023-12-13 — End: 2023-12-13
  Administered 2023-12-13: 1000 ug via INTRAMUSCULAR

## 2023-12-13 NOTE — Progress Notes (Signed)
Patient presented for B 12 injection to left deltoid, patient voiced no concerns nor showed any signs of distress during injection. 

## 2024-01-03 ENCOUNTER — Other Ambulatory Visit: Payer: Self-pay | Admitting: Internal Medicine

## 2024-01-09 ENCOUNTER — Ambulatory Visit (INDEPENDENT_AMBULATORY_CARE_PROVIDER_SITE_OTHER): Payer: Medicare Other

## 2024-01-09 DIAGNOSIS — E538 Deficiency of other specified B group vitamins: Secondary | ICD-10-CM | POA: Diagnosis not present

## 2024-01-09 MED ORDER — CYANOCOBALAMIN 1000 MCG/ML IJ SOLN
1000.0000 ug | Freq: Once | INTRAMUSCULAR | Status: AC
  Administered 2024-01-09: 1000 ug via INTRAMUSCULAR

## 2024-01-09 NOTE — Progress Notes (Signed)
 Pt received B12 injection in Left  deltoidmuscle. Patient tolerated it well with no complaints or concerns.

## 2024-01-10 ENCOUNTER — Ambulatory Visit: Payer: Medicare Other

## 2024-01-11 ENCOUNTER — Ambulatory Visit: Payer: Medicare Other | Admitting: Internal Medicine

## 2024-01-18 ENCOUNTER — Ambulatory Visit: Payer: Medicare Other | Admitting: Internal Medicine

## 2024-01-31 ENCOUNTER — Ambulatory Visit (INDEPENDENT_AMBULATORY_CARE_PROVIDER_SITE_OTHER): Payer: Medicare Other | Admitting: *Deleted

## 2024-01-31 VITALS — Ht 67.0 in | Wt 160.0 lb

## 2024-01-31 DIAGNOSIS — Z0000 Encounter for general adult medical examination without abnormal findings: Secondary | ICD-10-CM

## 2024-01-31 DIAGNOSIS — Z961 Presence of intraocular lens: Secondary | ICD-10-CM | POA: Diagnosis not present

## 2024-01-31 NOTE — Patient Instructions (Signed)
 [name] , Thank you for taking time to come for your Medicare Wellness Visit. I appreciate your ongoing commitment to your health goals. Please review the following plan we discussed and let me know if I can assist you in the future.   Referrals/Orders/Follow-Ups/Clinician Recommendations: Remember to get your shingles vaccines. Your tetanus vaccine has been updated.  This is a list of the screening recommended for you and due dates:  Health Maintenance  Topic Date Due   Zoster (Shingles) Vaccine (1 of 2) 11/10/1990   COVID-19 Vaccine (4 - 2024-25 season) 07/24/2023   Medicare Annual Wellness Visit  01/30/2025   DTaP/Tdap/Td vaccine (3 - Td or Tdap) 07/10/2033   Pneumonia Vaccine  Completed   Flu Shot  Completed   HPV Vaccine  Aged Out   Colon Cancer Screening  Discontinued    Advanced directives: (Copy Requested) Please bring a copy of your health care power of attorney and living will to the office to be added to your chart at your convenience. You can mail to [facility] [address] [place], [place] [address] or email to [email or web address]  Next Medicare Annual Wellness Visit scheduled for next year: Yes 02/04/25 @ 2:20

## 2024-01-31 NOTE — Progress Notes (Signed)
 Subjective:   John Burns is a 83 y.o. who presents for a Medicare Wellness preventive visit.  Visit Complete: Virtual I connected with  John Burns on 01/31/24 by a audio enabled telemedicine application and verified that I am speaking with the correct person using two identifiers. Patient's daughter John Burns helped with the visit.  Patient Location: Home  Provider Location: Office/Clinic  I discussed the limitations of evaluation and management by telemedicine. The patient expressed understanding and agreed to proceed.  Vital Signs: Because this visit was a virtual/telehealth visit, some criteria may be missing or patient reported. Any vitals not documented were not able to be obtained and vitals that have been documented are patient reported.  VideoDeclined- This patient declined Librarian, academic. Therefore the visit was completed with audio only.  AWV Questionnaire: No: Patient Medicare AWV questionnaire was not completed prior to this visit.  Cardiac Risk Factors include: advanced age (>21men, >33 women);male gender;hypertension     Objective:    Today's Vitals   01/31/24 1258  Weight: 160 lb (72.6 kg)  Height: 5\' 7"  (1.702 m)   Body mass index is 25.06 kg/m.     01/31/2024    1:13 PM 01/28/2023    1:33 PM 11/13/2021    9:08 AM 11/12/2020   10:13 AM 01/06/2020    4:28 PM 11/12/2019   10:30 AM 07/20/2018    3:24 PM  Advanced Directives  Does Patient Have a Medical Advance Directive? Yes No No No No No No  Type of Estate agent of [place];Living will        Does patient want to make changes to medical advance directive?  No - Patient declined    No - Patient declined   Copy of Healthcare Power of Attorney in Chart? No - copy requested        Would patient like information on creating a medical advance directive?   No - Patient declined No - Patient declined No - Patient declined  No - Patient declined     Current Medications (verified) Outpatient Encounter Medications as of 01/31/2024  Medication Sig   amLODipine (NORVASC) 10 MG tablet Take 1 tablet (10 mg total) by mouth daily.   aspirin 81 MG tablet Take 1 tablet (81 mg total) by mouth daily.   atorvastatin (LIPITOR) 40 MG tablet Take 1 tablet (40 mg total) by mouth daily.   Calcium Carb-Cholecalciferol (CALCIUM-VITAMIN D) 500-200 MG-UNIT tablet Take 1 tablet by mouth daily.   donepezil (ARICEPT) 10 MG tablet TAKE 1 TABLET(10 MG) BY MOUTH AT BEDTIME   levothyroxine (SYNTHROID) 50 MCG tablet TAKE 1 TABLET(50 MCG) BY MOUTH DAILY   nitroGLYCERIN (NITROSTAT) 0.4 MG SL tablet Place 1 tablet (0.4 mg total) under the tongue every 5 (five) minutes as needed for chest pain.   omeprazole (PRILOSEC) 20 MG capsule TAKE 1 CAPSULE(20 MG) BY MOUTH DAILY   telmisartan (MICARDIS) 80 MG tablet Take 1 tablet (80 mg total) by mouth daily.   vitamin E 400 UNIT capsule Take 400 Units by mouth daily.   No facility-administered encounter medications on file as of 01/31/2024.    Allergies (verified) Patient has no known allergies.   History: Past Medical History:  Diagnosis Date   ASCVD (arteriosclerotic cardiovascular disease)    Bladder neck obstruction    CAD (coronary artery disease) 2007   s/p PCI to mid LAD   COPD (chronic obstructive pulmonary disease) (HCC)    Diverticulosis    GERD (  gastroesophageal reflux disease)    Heart attack (HCC)    History of hiatal hernia    History of tobacco abuse    HOH (hard of hearing)    Hypercholesteremia    Hypertension    Shortness of breath dyspnea    on exertion   Stenosis of coronary stent 2013   Past Surgical History:  Procedure Laterality Date   ANGIOPLASTY / STENTING FEMORAL     CARDIAC CATHETERIZATION  05/10/12   patent LAD stent w/ 50% in stent restenosis; 70% stenosis ostium of left circumflex; 75% stenosis mid left circumflex   CATARACT EXTRACTION W/PHACO Left 12/29/2015   Procedure:  CATARACT EXTRACTION PHACO AND INTRAOCULAR LENS PLACEMENT (IOC);  Surgeon: Sallee Lange, MD;  Location: [facility] ORS;  Service: Ophthalmology;  Laterality: Left;  [place]: 01:53.8 AP%: 26.2 CDE: 49.38 Lot # [id number]   CATARACT EXTRACTION W/PHACO Right 01/26/2016   Procedure: CATARACT EXTRACTION PHACO AND INTRAOCULAR LENS PLACEMENT (IOC);  Surgeon: Sallee Lange, MD;  Location: [facility] ORS;  Service: Ophthalmology;  Laterality: Right;  [place] 01:13 AP% 25.3 CDE 33.95 fluid pack lot # [id number]   COLONOSCOPY  02/05/14   CORONARY ARTERY BYPASS GRAFT  06/04/14   x2 [facility]   CORONARY STENT PLACEMENT  2007   ESOPHAGOGASTRODUODENOSCOPY  02/05/14   Family History  Problem Relation Age of Onset   Heart disease Mother    Cancer Father 15       prostate and colon cancer   Social History   Socioeconomic History   Marital status: Married    Spouse name: Not on file   Number of children: Not on file   Years of education: Not on file   Highest education level: Not on file  Occupational History   Not on file  Tobacco Use   Smoking status: Former    Current packs/day: 0.00    Average packs/day: 1 pack/day for 50.0 years (50.0 ttl pk-yrs)    Types: Cigarettes    Start date: 11/23/1943    Quit date: 11/22/1993    Years since quitting: 30.2   Smokeless tobacco: Former    Types: Chew   Tobacco comments:    used chew to help quit smoking X1 year.  Substance and Sexual Activity   Alcohol use: No   Drug use: No   Sexual activity: Yes  Other Topics Concern   Not on file  Social History Narrative   Married   Social Drivers of Health   Financial Resource Strain: Low Risk  (01/31/2024)   Overall Financial Resource Strain (CARDIA)    Difficulty of Paying Living Expenses: Not hard at all  Food Insecurity: No Food Insecurity (01/31/2024)   Hunger Vital Sign    Worried About Running Out of Food in the Last Year: Never true    Ran Out of Food in the Last Year: Never true  Transportation Needs: No  Transportation Needs (01/31/2024)   PRAPARE - Administrator, Civil Service (Medical): No    Lack of Transportation (Non-Medical): No  Physical Activity: Insufficiently Active (01/31/2024)   Exercise Vital Sign    Days of Exercise per Week: 5 days    Minutes of Exercise per Session: 20 min  Stress: No Stress Concern Present (01/31/2024)   [employer] of Occupational Health - Occupational Stress Questionnaire    Feeling of Stress : Not at all  Social Connections: Moderately Integrated (01/31/2024)   Social Connection and Isolation Panel [NHANES]    Frequency of Communication with Friends  and Family: Twice a week    Frequency of Social Gatherings with Friends and Family: More than three times a week    Attends Religious Services: More than 4 times per year    Active Member of [employer] or Organizations: No    Attends Banker Meetings: Never    Marital Status: Married    Tobacco Counseling Counseling given: Not Answered Tobacco comments: used chew to help quit smoking X1 year.    Clinical Intake:  Pre-visit preparation completed: Yes  Pain : No/denies pain     BMI - recorded: 25.06 Nutritional Status: BMI 25 -29 Overweight Nutritional Risks: None Diabetes: No  How often do you need to have someone help you when you read instructions, pamphlets, or other written materials from your doctor or pharmacy?: 1 - Never  Interpreter Needed?: No  Information entered by :: R. Turner Baillie LPN   Activities of Daily Living     01/31/2024    1:00 PM  In your present state of health, do you have any difficulty performing the following activities:  Hearing? 0  Vision? 0  Difficulty concentrating or making decisions? 0  Walking or climbing stairs? 1  Dressing or bathing? 0  Doing errands, shopping? 0  Preparing Food and eating ? N  Using the Toilet? N  In the past six months, have you accidently leaked urine? N  Do you have problems with loss of bowel control? N   Managing your Medications? Y  Managing your Finances? Y  Housekeeping or managing your Housekeeping? N    Patient Care Team: Sherlene Shams, MD as PCP - General (Internal Medicine)  Indicate any recent Medical Services you may have received from other than [facility] providers in the past year (date may be approximate).     Assessment:   This is a routine wellness examination for [facility].  Hearing/Vision screen Hearing Screening - Comments:: No issues Vision Screening - Comments:: glasses   Goals Addressed             This Visit's Progress    Patient Stated       Wants to eat well        Depression Screen     01/31/2024    1:08 PM 01/28/2023    1:51 PM 01/10/2023    8:42 AM 10/04/2022    1:42 PM 07/09/2022    8:10 AM 01/01/2022    8:39 AM 11/13/2021    9:07 AM  PHQ 2/9 Scores  PHQ - 2 Score 0 3 3 0 1 0 0  PHQ- 9 Score 0 4 4      Exception Documentation  --         Fall Risk     01/31/2024    1:02 PM 07/11/2023    9:17 AM 01/28/2023    1:36 PM 01/10/2023    8:42 AM 10/04/2022    1:42 PM  Fall Risk   Falls in the past year? 0 0 0 0 0  Number falls in past yr: 0 0 0 0   Injury with Fall? 0 0 0 0   Risk for fall due to : No Fall Risks No Fall Risks  No Fall Risks No Fall Risks  Follow up Falls prevention discussed;Falls evaluation completed Falls evaluation completed Falls evaluation completed;Falls prevention discussed Falls evaluation completed Falls evaluation completed    MEDICARE RISK AT HOME:  Medicare Risk at Home Any stairs in or around the home?: Yes If so, are there  any without handrails?: No Home free of loose throw rugs in walkways, pet beds, electrical cords, etc?: Yes Adequate lighting in your home to reduce risk of falls?: Yes Life alert?: No Use of a cane, walker or w/c?: No Grab bars in the bathroom?: Yes Shower chair or bench in shower?: No Elevated toilet seat or a handicapped toilet?: No  TIMED UP AND GO:  Was the test performed?   No  Cognitive Function: 6CIT completed    11/13/2021    9:19 AM 07/20/2018    3:27 PM 07/19/2016    3:42 PM  MMSE - Mini Mental State Exam  Not completed: Unable to complete    Orientation to time  5 5  Orientation to Place  5 5  Registration  3 3  Attention/ Calculation  5 5  Recall  3 2  Language- name 2 objects  2 2  Language- repeat  1 1  Language- follow 3 step command  3 3  Language- read & follow direction  1 1  Write a sentence  1 1  Copy design  1 1  Total score  30 29        01/31/2024    1:16 PM 01/28/2023    1:38 PM 11/12/2020   10:14 AM 11/12/2019   10:32 AM 07/19/2017    3:39 PM  6CIT Screen  What Year? 4 points 0 points 0 points 0 points 0 points  What month? 0 points 0 points 0 points 0 points 0 points  What time? 0 points 0 points 0 points 0 points 0 points  Count back from 20 2 points 0 points 0 points 0 points 0 points  Months in reverse 4 points 4 points 0 points 2 points 0 points  Repeat phrase 10 points 10 points 10 points 4 points 0 points  Total Score 20 points 14 points 10 points 6 points 0 points    Immunizations Immunization History  Administered Date(s) Administered   Fluad Quad(high Dose 65+) 08/20/2019, 09/16/2020, 09/09/2021, 09/02/2022   Fluad Trivalent(High Dose 65+) 10/10/2023   Influenza Split 09/26/2013   Influenza, High Dose Seasonal PF 07/28/2016, 08/23/2017, 09/05/2018   Influenza,inj,Quad PF,6+ Mos 08/28/2014, 09/30/2015   Moderna Sars-Covid-2 Vaccination 12/05/2019, 01/02/2020, 10/22/2020   Pneumococcal Conjugate-13 12/18/2014   Pneumococcal Polysaccharide-23 11/26/2013   Tdap 11/21/2009, 07/11/2023   Zoster, Live 11/21/2004    Screening Tests Health Maintenance  Topic Date Due   Zoster Vaccines- Shingrix (1 of 2) 11/10/1990   COVID-19 Vaccine (4 - 2024-25 season) 07/24/2023   Medicare Annual Wellness (AWV)  01/30/2025   DTaP/Tdap/Td (3 - Td or Tdap) 07/10/2033   Pneumonia Vaccine 87+ Years old  Completed   INFLUENZA  VACCINE  Completed   HPV VACCINES  Aged Out   Colonoscopy  Discontinued    Health Maintenance  Health Maintenance Due  Topic Date Due   Zoster Vaccines- Shingrix (1 of 2) 11/10/1990   COVID-19 Vaccine (4 - 2024-25 season) 07/24/2023   Health Maintenance Items Addressed: Discussed the need for shingles vaccines.  Additional Screening:  Vision Screening: Recommended annual ophthalmology exams for early detection of glaucoma and other disorders of the eye. Up to date [facility]  Dental Screening: Recommended annual dental exams for proper oral hygiene  Community Resource Referral / Chronic Care Management: CRR required this visit?  No   CCM required this visit?  No     Plan:     I have personally reviewed and noted the following in the  patient's chart:   Medical and social history Use of alcohol, tobacco or illicit drugs  Current medications and supplements including opioid prescriptions. Patient is not currently taking opioid prescriptions. Functional ability and status Nutritional status Physical activity Advanced directives List of other physicians Hospitalizations, surgeries, and ER visits in previous 12 months Vitals Screenings to include cognitive, depression, and falls Referrals and appointments  In addition, I have reviewed and discussed with patient certain preventive protocols, quality metrics, and best practice recommendations. A written personalized care plan for preventive services as well as general preventive health recommendations were provided to patient.     Sydell Axon, LPN   1/61/0960   After Visit Summary: (MyChart) Due to this being a telephonic visit, the after visit summary with patients personalized plan was offered to patient via MyChart   Notes: Nothing significant to report at this time.

## 2024-02-07 ENCOUNTER — Ambulatory Visit

## 2024-02-07 DIAGNOSIS — E538 Deficiency of other specified B group vitamins: Secondary | ICD-10-CM | POA: Diagnosis not present

## 2024-02-07 MED ORDER — CYANOCOBALAMIN 1000 MCG/ML IJ SOLN
1000.0000 ug | Freq: Once | INTRAMUSCULAR | Status: AC
Start: 2024-02-07 — End: 2024-02-07
  Administered 2024-02-07: 1000 ug via INTRAMUSCULAR

## 2024-02-07 NOTE — Progress Notes (Signed)
 Patient presented for B 12 injection to left deltoid, patient voiced no concerns nor showed any signs of distress during injection.

## 2024-02-13 ENCOUNTER — Ambulatory Visit (INDEPENDENT_AMBULATORY_CARE_PROVIDER_SITE_OTHER): Payer: Medicare Other | Admitting: Internal Medicine

## 2024-02-13 ENCOUNTER — Encounter: Payer: Self-pay | Admitting: Internal Medicine

## 2024-02-13 VITALS — BP 138/64 | HR 55 | Ht 67.0 in | Wt 161.2 lb

## 2024-02-13 DIAGNOSIS — E785 Hyperlipidemia, unspecified: Secondary | ICD-10-CM

## 2024-02-13 DIAGNOSIS — R5383 Other fatigue: Secondary | ICD-10-CM | POA: Diagnosis not present

## 2024-02-13 DIAGNOSIS — K2270 Barrett's esophagus without dysplasia: Secondary | ICD-10-CM | POA: Diagnosis not present

## 2024-02-13 DIAGNOSIS — R7303 Prediabetes: Secondary | ICD-10-CM

## 2024-02-13 DIAGNOSIS — E039 Hypothyroidism, unspecified: Secondary | ICD-10-CM | POA: Diagnosis not present

## 2024-02-13 DIAGNOSIS — Z23 Encounter for immunization: Secondary | ICD-10-CM | POA: Diagnosis not present

## 2024-02-13 DIAGNOSIS — F0150 Vascular dementia without behavioral disturbance: Secondary | ICD-10-CM | POA: Diagnosis not present

## 2024-02-13 DIAGNOSIS — R001 Bradycardia, unspecified: Secondary | ICD-10-CM

## 2024-02-13 DIAGNOSIS — I2510 Atherosclerotic heart disease of native coronary artery without angina pectoris: Secondary | ICD-10-CM

## 2024-02-13 DIAGNOSIS — I10 Essential (primary) hypertension: Secondary | ICD-10-CM | POA: Diagnosis not present

## 2024-02-13 MED ORDER — ATORVASTATIN CALCIUM 40 MG PO TABS: 40.0000 mg | ORAL_TABLET | Freq: Every day | ORAL | 3 refills | Status: DC

## 2024-02-13 MED ORDER — AMLODIPINE BESYLATE 10 MG PO TABS: 10.0000 mg | ORAL_TABLET | Freq: Every day | ORAL | 1 refills | Status: DC

## 2024-02-13 MED ORDER — ATORVASTATIN CALCIUM 40 MG PO TABS
40.0000 mg | ORAL_TABLET | Freq: Every day | ORAL | 3 refills | Status: DC
Start: 2024-02-13 — End: 2024-02-13

## 2024-02-13 NOTE — Progress Notes (Unsigned)
 Subjective:  Patient ID: John Burns, male    DOB: [birth year]  Age: 83 y.o. MRN: [id number]  CC: The primary encounter diagnosis was Essential hypertension. Diagnoses of Hypothyroidism (acquired), Hyperlipidemia, unspecified hyperlipidemia type, Prediabetes, Other fatigue, and Vascular dementia without behavioral disturbance, psychotic disturbance, mood disturbance, or anxiety, unspecified dementia severity (HCC) were also pertinent to this visit.   HPI John Burns presents for  Chief Complaint  Patient presents with   Medical Management of Chronic Issues    6 month follow up    1) vascular Dementia:  nonprogressive , tolerating aricept without severe bradycardia . No sundownign or weight loss.  Goes to bed at dark and stays in bed until 10 am   2) HTN  3) Hypothyroid  4) CAD:  seen in Jan by [facility].  No changes to regimen pacemaker deferred given improvement in bradycardia with dc metoprolol and Cartia XT    Outpatient Medications Prior to Visit  Medication Sig Dispense Refill   aspirin 81 MG tablet Take 1 tablet (81 mg total) by mouth daily. 90 tablet 1   Calcium Carb-Cholecalciferol (CALCIUM-VITAMIN D) 500-200 MG-UNIT tablet Take 1 tablet by mouth daily.     donepezil (ARICEPT) 10 MG tablet TAKE 1 TABLET(10 MG) BY MOUTH AT BEDTIME 90 tablet 1   levothyroxine (SYNTHROID) 50 MCG tablet TAKE 1 TABLET(50 MCG) BY MOUTH DAILY 90 tablet 1   nitroGLYCERIN (NITROSTAT) 0.4 MG SL tablet Place 1 tablet (0.4 mg total) under the tongue every 5 (five) minutes as needed for chest pain. 50 tablet 3   omeprazole (PRILOSEC) 20 MG capsule TAKE 1 CAPSULE(20 MG) BY MOUTH DAILY 90 capsule 3   telmisartan (MICARDIS) 80 MG tablet Take 1 tablet (80 mg total) by mouth daily. 90 tablet 3   vitamin E 400 UNIT capsule Take 400 Units by mouth daily.     amLODipine (NORVASC) 10 MG tablet Take 1 tablet (10 mg total) by mouth daily. 90 tablet 1   atorvastatin (LIPITOR) 40 MG tablet Take 1 tablet (40  mg total) by mouth daily. 90 tablet 3   No facility-administered medications prior to visit.    Review of Systems;  Patient denies headache, fevers, malaise, unintentional weight loss, skin rash, eye pain, sinus congestion and sinus pain, sore throat, dysphagia,  hemoptysis , cough, dyspnea, wheezing, chest pain, palpitations, orthopnea, edema, abdominal pain, nausea, melena, diarrhea, constipation, flank pain, dysuria, hematuria, urinary  Frequency, nocturia, numbness, tingling, seizures,  Focal weakness, Loss of consciousness,  Tremor, insomnia, depression, anxiety, and suicidal ideation.      Objective:  BP 138/64   Pulse (!) 55   Ht 5\' 7"  (1.702 m)   Wt 161 lb 3.2 oz (73.1 kg)   SpO2 98%   BMI 25.25 kg/m   BP Readings from Last 3 Encounters:  02/13/24 138/64  10/10/23 130/66  07/11/23 (!) 180/88    Wt Readings from Last 3 Encounters:  02/13/24 161 lb 3.2 oz (73.1 kg)  01/31/24 160 lb (72.6 kg)  07/11/23 160 lb 6.4 oz (72.8 kg)    Physical Exam Vitals reviewed.  Constitutional:      General: He is not in acute distress.    Appearance: Normal appearance. He is normal weight. He is not ill-appearing, toxic-appearing or diaphoretic.  HENT:     Head: Normocephalic.  Eyes:     General: No scleral icterus.       Right eye: No discharge.        Left eye: No  discharge.     Conjunctiva/sclera: Conjunctivae normal.  Cardiovascular:     Rate and Rhythm: Normal rate and regular rhythm.     Heart sounds: Normal heart sounds.  Pulmonary:     Effort: Pulmonary effort is normal. No respiratory distress.     Breath sounds: Normal breath sounds.  Musculoskeletal:        General: Normal range of motion.     Cervical back: Normal range of motion.  Skin:    General: Skin is warm and dry.  Neurological:     General: No focal deficit present.     Mental Status: He is alert and oriented to person, place, and time. Mental status is at baseline.  Psychiatric:        Mood and  Affect: Mood normal.        Behavior: Behavior normal.        Thought Content: Thought content normal.        Judgment: Judgment normal.   Lab Results  Component Value Date   HGBA1C 5.5 01/10/2023   HGBA1C 6.2 07/09/2022   HGBA1C 5.9 01/01/2022    Lab Results  Component Value Date   CREATININE 1.02 07/11/2023   CREATININE 1.06 01/10/2023   CREATININE 1.25 11/01/2022    Lab Results  Component Value Date   WBC 6.2 01/10/2023   HGB 14.0 01/10/2023   HCT 42.2 01/10/2023   PLT 164.0 01/10/2023   GLUCOSE 90 07/11/2023   CHOL 137 07/11/2023   TRIG 119.0 07/11/2023   HDL 45.70 07/11/2023   LDLDIRECT 71.0 07/11/2023   LDLCALC 68 07/11/2023   ALT 8 07/11/2023   AST 13 07/11/2023   NA 139 07/11/2023   K 3.8 07/11/2023   CL 105 07/11/2023   CREATININE 1.02 07/11/2023   BUN 11 07/11/2023   CO2 26 07/11/2023   TSH 3.54 07/11/2023   PSA 2.33 05/22/2015   HGBA1C 5.5 01/10/2023   MICROALBUR 0.9 01/10/2023    MR LUMBAR SPINE WO CONTRAST Result Date: 06/14/2022 CLINICAL DATA:  Lumbar radiculitis. Chronic low back pain with left-sided sciatica. EXAM: MRI LUMBAR SPINE WITHOUT CONTRAST TECHNIQUE: Multiplanar, multisequence MR imaging of the lumbar spine was performed. No intravenous contrast was administered. COMPARISON:  Lumbar spine MRI 04/18/2020 FINDINGS: Segmentation:  Standard. Alignment:  Unchanged trace retrolisthesis of L2 on L3. Vertebrae: No fracture, suspicious marrow lesion, or significant marrow edema. Chronic degenerative endplate changes at T11-12, L2-3, and L5-S1. Conus medullaris and cauda equina: Conus extends to the L1-2 level. Unchanged small lipoma of the filum. Paraspinal and other soft tissues: Left renal sinus cysts. Disc levels: Disc desiccation throughout the lumbar and lower thoracic spine. Unchanged severe disc space narrowing at T11-12 and L5-S1 and moderate narrowing at L2-3. T12-L1: No disc herniation or stenosis. L1-2: Disc bulging results in mild left neural  foraminal stenosis without spinal stenosis, unchanged. L2-3: Disc bulging, a chronic left subarticular disc extrusion with mild cranial migration, and mild facet hypertrophy result in severe left lateral recess and moderate left neural foraminal stenosis with potential left L2 and L3 nerve root impingement. The morphology of the disc extrusion is mildly different than in 2021, overall appearing perhaps slightly smaller but with slightly greater medial extent in the lateral recess. The overall degree of stenosis is similar to the prior MRI. Borderline spinal stenosis. L3-4: Mild disc bulging with central and left foraminal annular fissures and mild facet hypertrophy, unchanged. No significant stenosis. L4-5: Disc bulging, a right central disc protrusion with annular fissure, and mild  facet hypertrophy result in mild bilateral lateral recess stenosis and borderline spinal stenosis without neural foraminal stenosis, unchanged. L5-S1: Circumferential disc osteophyte complex and mild facet hypertrophy result in minimal bilateral lateral recess stenosis and moderate bilateral neural foraminal stenosis without spinal stenosis, unchanged. IMPRESSION: Multilevel lumbar disc and facet degeneration without significant interval change, most notable at L2-3 where a chronic disc extrusion results in left lateral recess and left neural foraminal stenosis. Electronically Signed   By: Sebastian Ache M.D.   On: 06/14/2022 18:25    Assessment & Plan:  .Essential hypertension Assessment & Plan: BP improved on amlodipine 10 mg daily .and telmisartan 80 mg daily    Orders: -     Comprehensive metabolic panel -     Microalbumin / creatinine urine ratio  Hypothyroidism (acquired) Assessment & Plan: Rechecking TSH today as daughter is not sure how adherent patient is with dosing  Orders: -     TSH  Hyperlipidemia, unspecified hyperlipidemia type -     Lipid panel -     LDL cholesterol, direct  Prediabetes -      Comprehensive metabolic panel -     Hemoglobin A1c  Other fatigue -     CBC with Differential/Platelet  Vascular dementia without behavioral disturbance, psychotic disturbance, mood disturbance, or anxiety, unspecified dementia severity (HCC) Assessment & Plan: Nonprogressive .  Tolerating aricept.  No changes today unless bradycardia worsens    Other orders -     amLODIPine Besylate; Take 1 tablet (10 mg total) by mouth daily.  Dispense: 90 tablet; Refill: 1 -     Atorvastatin Calcium; Take 1 tablet (40 mg total) by mouth daily.  Dispense: 90 tablet; Refill: 3     I spent 34 minutes on the day of this face to face encounter reviewing patient's  most recent visit with cardiology,  nephrology,  and neurology,  prior relevant surgical and non surgical procedures, recent  labs and imaging studies, counseling on weight management,  reviewing the assessment and plan with patient, and post visit ordering and reviewing of  diagnostics and therapeutics with patient  .   Follow-up: No follow-ups on file.   Sherlene Shams, MD

## 2024-02-13 NOTE — Patient Instructions (Addendum)
 I will refill the atorvastatin once I see the lipid panel from today  Goal LDL is < 55 so I may change the dose to 80 mg daily   Otherwise you are doing well!

## 2024-02-13 NOTE — Assessment & Plan Note (Signed)
 He is asymptomatic and taking atorvastatin, , ASA and telmisartan.  Metoprolol was stopped due to persitent sinus bradycardia, symptomatic.    LDL is at goal.  He has had his annual follow up with Dr Darrold Junker and no medication changes were made. He denies chest pain .  Continue current meds. l Lab Results  Component Value Date   CHOL 137 07/11/2023   HDL 45.70 07/11/2023   LDLCALC 68 07/11/2023   LDLDIRECT 71.0 07/11/2023   TRIG 119.0 07/11/2023   CHOLHDL 3 07/11/2023

## 2024-02-13 NOTE — Assessment & Plan Note (Addendum)
 Nonprogressive .  Tolerating aricept.  No changes today unless bradycardia worsens

## 2024-02-13 NOTE — Assessment & Plan Note (Signed)
 BP improved on amlodipine 10 mg daily .and telmisartan 80 mg daily  . NO CHANGES TODAY

## 2024-02-13 NOTE — Assessment & Plan Note (Signed)
 Rechecking TSH today as daughter is not sure how adherent patient is with dosing

## 2024-02-14 ENCOUNTER — Encounter: Payer: Self-pay | Admitting: Internal Medicine

## 2024-02-14 LAB — MICROALBUMIN / CREATININE URINE RATIO
Creatinine,U: 90.7 mg/dL
Microalb Creat Ratio: UNDETERMINED mg/g (ref 0.0–30.0)
Microalb, Ur: <0.7 mg/dL

## 2024-02-14 LAB — COMPREHENSIVE METABOLIC PANEL
ALT: 9 U/L (ref 0–53)
AST: 14 U/L (ref 0–37)
Albumin: 4.3 g/dL (ref 3.5–5.2)
Alkaline Phosphatase: 112 U/L (ref 39–117)
BUN: 13 mg/dL (ref 6–23)
CO2: 25 meq/L (ref 19–32)
Calcium: 9.3 mg/dL (ref 8.4–10.5)
Chloride: 103 meq/L (ref 96–112)
Creatinine, Ser: 1.18 mg/dL (ref 0.40–1.50)
GFR: 57.16 mL/min — ABNORMAL LOW (ref >60.00)
Glucose, Bld: 91 mg/dL (ref 70–99)
Potassium: 3.6 meq/L (ref 3.5–5.1)
Sodium: 136 meq/L (ref 135–145)
Total Bilirubin: 0.5 mg/dL (ref 0.2–1.2)
Total Protein: 6.8 g/dL (ref 6.0–8.3)

## 2024-02-14 LAB — LIPID PANEL
Cholesterol: 148 mg/dL (ref 0–200)
HDL: 48.5 mg/dL (ref >39.00)
LDL Cholesterol: 75 mg/dL (ref 0–99)
NonHDL: 99.51
Total CHOL/HDL Ratio: 3
Triglycerides: 124 mg/dL (ref 0.0–149.0)
VLDL: 24.8 mg/dL (ref 0.0–40.0)

## 2024-02-14 LAB — CBC WITH DIFFERENTIAL/PLATELET
Basophils Absolute: 0.1 10*3/uL (ref 0.0–0.1)
Basophils Relative: 0.9 % (ref 0.0–3.0)
Eosinophils Absolute: 0.2 10*3/uL (ref 0.0–0.7)
Eosinophils Relative: 2.3 % (ref 0.0–5.0)
HCT: 42.7 % (ref 39.0–52.0)
Hemoglobin: 14.3 g/dL (ref 13.0–17.0)
Lymphocytes Relative: 20.9 % (ref 12.0–46.0)
Lymphs Abs: 1.4 10*3/uL (ref 0.7–4.0)
MCHC: 33.4 g/dL (ref 30.0–36.0)
MCV: 93.7 fl (ref 78.0–100.0)
Monocytes Absolute: 0.6 10*3/uL (ref 0.1–1.0)
Monocytes Relative: 9.3 % (ref 3.0–12.0)
Neutro Abs: 4.4 10*3/uL (ref 1.4–7.7)
Neutrophils Relative %: 66.6 % (ref 43.0–77.0)
Platelets: 176 10*3/uL (ref 150.0–400.0)
RBC: 4.55 Mil/uL (ref 4.22–5.81)
RDW: 13.8 % (ref 11.5–15.5)
WBC: 6.6 10*3/uL (ref 4.0–10.5)

## 2024-02-14 LAB — LDL CHOLESTEROL, DIRECT: Direct LDL: 77 mg/dL

## 2024-02-14 LAB — TSH: TSH: 3.2 u[IU]/mL (ref 0.35–5.50)

## 2024-02-14 LAB — HEMOGLOBIN A1C: Hgb A1c MFr Bld: 5.6 % (ref 4.6–6.5)

## 2024-02-14 NOTE — Assessment & Plan Note (Addendum)
 Managed with  omeprazole, change administration to evening .  No symptoms of cough, reflux or dysphagia .  Last EGD was in 2015.  No follow up given age

## 2024-02-14 NOTE — Assessment & Plan Note (Signed)
 Managed with 40 mg atorvastatin,  may require increase to 80 for goal LDL < 55. Labs pending   Lab Results  Component Value Date   CHOL 137 07/11/2023   HDL 45.70 07/11/2023   LDLCALC 68 07/11/2023   LDLDIRECT 71.0 07/11/2023   TRIG 119.0 07/11/2023   CHOLHDL 3 07/11/2023   Lab Results  Component Value Date   ALT 8 07/11/2023   AST 13 07/11/2023   ALKPHOS 107 07/11/2023   BILITOT 0.8 07/11/2023

## 2024-02-14 NOTE — Assessment & Plan Note (Signed)
 Resting pulse has improved since  Stopping diltiazem and  metoprolol .  No plans for pacemaker currently per CARDIOLOGY

## 2024-03-13 ENCOUNTER — Ambulatory Visit (INDEPENDENT_AMBULATORY_CARE_PROVIDER_SITE_OTHER)

## 2024-03-13 DIAGNOSIS — E538 Deficiency of other specified B group vitamins: Secondary | ICD-10-CM

## 2024-03-13 MED ORDER — CYANOCOBALAMIN 1000 MCG/ML IJ SOLN
1000.0000 ug | Freq: Once | INTRAMUSCULAR | Status: AC
  Administered 2024-03-13: 1000 ug via INTRAMUSCULAR

## 2024-03-13 NOTE — Progress Notes (Signed)
 Patient presented for B 12 injection to left deltoid, patient voiced no concerns nor showed any signs of distress during injection.

## 2024-04-18 ENCOUNTER — Ambulatory Visit (INDEPENDENT_AMBULATORY_CARE_PROVIDER_SITE_OTHER)

## 2024-04-18 DIAGNOSIS — E538 Deficiency of other specified B group vitamins: Secondary | ICD-10-CM | POA: Diagnosis not present

## 2024-04-18 MED ORDER — CYANOCOBALAMIN 1000 MCG/ML IJ SOLN
1000.0000 ug | Freq: Once | INTRAMUSCULAR | Status: AC
  Administered 2024-04-18: 1000 ug via INTRAMUSCULAR

## 2024-04-18 NOTE — Progress Notes (Signed)
 Pt presented for their vitamin B12 injection. Pt was identified through two identifiers. Pt tolerated shot well in their left deltoid.

## 2024-05-16 ENCOUNTER — Other Ambulatory Visit: Payer: Self-pay | Admitting: Internal Medicine

## 2024-05-16 ENCOUNTER — Telehealth: Payer: Self-pay

## 2024-05-16 MED ORDER — AMLODIPINE BESYLATE 10 MG PO TABS: 10.0000 mg | ORAL_TABLET | Freq: Every day | ORAL | 1 refills | Status: DC

## 2024-05-16 NOTE — Telephone Encounter (Signed)
 Pt's daughter is aware and gave a verbal understanding.

## 2024-05-16 NOTE — Telephone Encounter (Signed)
 Copied from CRM [id number]. Topic: Clinical - Prescription Issue >> May 16, 2024  9:56 AM Berneda FALCON wrote: Reason for CRM: Daughter Verneita states that patient is supposed to be on Amlodipine  10 MG but the last refill was only 5MG  and is not seeing it listed on his MyChart as a current medication. After review, it does look like this was discontinued and she would like to know why and what is going on with it.  [id number] [address], [place] - [address] AT [facility] OF [address] [place] [address] Phone: [phone] Fax: [phone] Hours: Not open 24 hours   Please call daughter Thomasine) back at [phone]

## 2024-05-17 ENCOUNTER — Telehealth: Payer: Self-pay | Admitting: Internal Medicine

## 2024-05-17 ENCOUNTER — Ambulatory Visit

## 2024-05-17 DIAGNOSIS — E538 Deficiency of other specified B group vitamins: Secondary | ICD-10-CM | POA: Diagnosis not present

## 2024-05-17 DIAGNOSIS — E559 Vitamin D deficiency, unspecified: Secondary | ICD-10-CM

## 2024-05-17 DIAGNOSIS — E039 Hypothyroidism, unspecified: Secondary | ICD-10-CM

## 2024-05-17 DIAGNOSIS — I10 Essential (primary) hypertension: Secondary | ICD-10-CM

## 2024-05-17 DIAGNOSIS — R944 Abnormal results of kidney function studies: Secondary | ICD-10-CM

## 2024-05-17 MED ORDER — CYANOCOBALAMIN 1000 MCG/ML IJ SOLN
1000.0000 ug | Freq: Once | INTRAMUSCULAR | Status: AC
  Administered 2024-05-17: 1000 ug via INTRAMUSCULAR

## 2024-05-17 NOTE — Telephone Encounter (Signed)
 Patient has been scheduled for a 21m follow up on October 6th, with Dr Marylynn.Fasting labs scheduled for September 29th. Please add lab orders.

## 2024-05-17 NOTE — Addendum Note (Signed)
 Addended by: MARYLYNN VERNEITA CROME on: 05/17/2024 05:34 PM   Modules accepted: Orders

## 2024-05-17 NOTE — Progress Notes (Signed)
 Patient presented for B 12 injection to left deltoid, patient voiced no concerns nor showed any signs of distress during injection.

## 2024-06-18 ENCOUNTER — Ambulatory Visit

## 2024-06-18 ENCOUNTER — Telehealth: Payer: Self-pay

## 2024-06-18 DIAGNOSIS — E538 Deficiency of other specified B group vitamins: Secondary | ICD-10-CM | POA: Diagnosis not present

## 2024-06-18 MED ORDER — CYANOCOBALAMIN 1000 MCG/ML IJ SOLN
1000.0000 ug | Freq: Once | INTRAMUSCULAR | Status: AC
  Administered 2024-06-18: 1000 ug via INTRAMUSCULAR

## 2024-06-18 NOTE — Progress Notes (Signed)
 Pt presented for their vitamin B12 injection. Pt was identified through two identifiers. Pt tolerated shot well in their left deltoid.

## 2024-06-18 NOTE — Telephone Encounter (Signed)
 Pt's daughter came into the office today because pt is needing a refill of his atorvastatin . The atorvastatin  has been discontinued. Looking at the last office note from 02/13/2024 that atorvastatin  was d/c'd then but at the bottom of the AVS it say that the dose may be increased based off of his lab work that was done that day. The lab note states for pt to continue current medications. Is it okay to refill the 40 mg or does the dose need to be increased to 80 mg?

## 2024-06-19 ENCOUNTER — Other Ambulatory Visit: Payer: Self-pay | Admitting: Internal Medicine

## 2024-06-19 MED ORDER — ATORVASTATIN CALCIUM 80 MG PO TABS: 80.0000 mg | ORAL_TABLET | Freq: Every day | ORAL | 3 refills | Status: AC

## 2024-06-19 NOTE — Telephone Encounter (Signed)
 Pt's daughter is aware that medication has been sent in and the dose has been increased to 80 mg.

## 2024-06-28 ENCOUNTER — Other Ambulatory Visit: Payer: Self-pay | Admitting: Internal Medicine

## 2024-07-04 ENCOUNTER — Other Ambulatory Visit: Payer: Self-pay

## 2024-07-04 DIAGNOSIS — E785 Hyperlipidemia, unspecified: Secondary | ICD-10-CM | POA: Diagnosis not present

## 2024-07-04 DIAGNOSIS — I471 Supraventricular tachycardia, unspecified: Secondary | ICD-10-CM | POA: Diagnosis not present

## 2024-07-04 DIAGNOSIS — R079 Chest pain, unspecified: Secondary | ICD-10-CM | POA: Diagnosis not present

## 2024-07-04 DIAGNOSIS — I25708 Atherosclerosis of coronary artery bypass graft(s), unspecified, with other forms of angina pectoris: Secondary | ICD-10-CM | POA: Diagnosis not present

## 2024-07-04 DIAGNOSIS — R0602 Shortness of breath: Secondary | ICD-10-CM | POA: Diagnosis not present

## 2024-07-04 DIAGNOSIS — I2510 Atherosclerotic heart disease of native coronary artery without angina pectoris: Secondary | ICD-10-CM | POA: Diagnosis not present

## 2024-07-04 DIAGNOSIS — J449 Chronic obstructive pulmonary disease, unspecified: Secondary | ICD-10-CM | POA: Diagnosis not present

## 2024-07-04 DIAGNOSIS — Z951 Presence of aortocoronary bypass graft: Secondary | ICD-10-CM | POA: Diagnosis not present

## 2024-07-04 DIAGNOSIS — R001 Bradycardia, unspecified: Secondary | ICD-10-CM | POA: Diagnosis not present

## 2024-07-04 DIAGNOSIS — Z9889 Other specified postprocedural states: Secondary | ICD-10-CM | POA: Diagnosis not present

## 2024-07-04 MED ORDER — DONEPEZIL HCL 10 MG PO TABS
ORAL_TABLET | ORAL | 1 refills | Status: AC
Start: 2024-07-04 — End: ?

## 2024-07-18 ENCOUNTER — Ambulatory Visit

## 2024-07-24 ENCOUNTER — Ambulatory Visit (INDEPENDENT_AMBULATORY_CARE_PROVIDER_SITE_OTHER)

## 2024-07-24 DIAGNOSIS — E538 Deficiency of other specified B group vitamins: Secondary | ICD-10-CM

## 2024-07-24 MED ORDER — CYANOCOBALAMIN 1000 MCG/ML IJ SOLN
1000.0000 ug | Freq: Once | INTRAMUSCULAR | Status: AC
  Administered 2024-07-24: 1000 ug via INTRAMUSCULAR

## 2024-07-24 NOTE — Progress Notes (Signed)
 Patient is in office today for a nurse visit for B12 Injection. Patient Injection was given in the  Right deltoid. Patient tolerated injection well.

## 2024-08-07 ENCOUNTER — Encounter: Payer: Self-pay | Admitting: Internal Medicine

## 2024-08-08 NOTE — Telephone Encounter (Signed)
 Would you like for them to schedule an appt to discuss?

## 2024-08-16 ENCOUNTER — Encounter: Payer: Self-pay | Admitting: Internal Medicine

## 2024-08-20 ENCOUNTER — Other Ambulatory Visit

## 2024-08-20 DIAGNOSIS — E039 Hypothyroidism, unspecified: Secondary | ICD-10-CM

## 2024-08-20 DIAGNOSIS — E559 Vitamin D deficiency, unspecified: Secondary | ICD-10-CM

## 2024-08-20 DIAGNOSIS — I10 Essential (primary) hypertension: Secondary | ICD-10-CM | POA: Diagnosis not present

## 2024-08-20 LAB — COMPREHENSIVE METABOLIC PANEL WITH GFR
ALT: 8 U/L (ref 0–53)
AST: 12 U/L (ref 0–37)
Albumin: 4.2 g/dL (ref 3.5–5.2)
Alkaline Phosphatase: 105 U/L (ref 39–117)
BUN: 15 mg/dL (ref 6–23)
CO2: 27 meq/L (ref 19–32)
Calcium: 9.9 mg/dL (ref 8.4–10.5)
Chloride: 104 meq/L (ref 96–112)
Creatinine, Ser: 1.21 mg/dL (ref 0.40–1.50)
GFR: 55.27 mL/min — ABNORMAL LOW (ref >60.00)
Glucose, Bld: 97 mg/dL (ref 70–99)
Potassium: 3.8 meq/L (ref 3.5–5.1)
Sodium: 139 meq/L (ref 135–145)
Total Bilirubin: 0.9 mg/dL (ref 0.2–1.2)
Total Protein: 6.4 g/dL (ref 6.0–8.3)

## 2024-08-20 LAB — TSH: TSH: 3.44 u[IU]/mL (ref 0.35–5.50)

## 2024-08-20 LAB — VITAMIN D 25 HYDROXY (VIT D DEFICIENCY, FRACTURES): VITD: 23.93 ng/mL — ABNORMAL LOW (ref 30.00–100.00)

## 2024-08-21 ENCOUNTER — Ambulatory Visit: Payer: Self-pay | Admitting: Internal Medicine

## 2024-08-21 MED ORDER — ERGOCALCIFEROL 1.25 MG (50000 UT) PO CAPS: 50000.0000 [IU] | ORAL_CAPSULE | ORAL | 0 refills | Status: AC

## 2024-08-21 NOTE — Addendum Note (Signed)
 Addended by: MARYLYNN VERNEITA CROME on: 08/21/2024 12:52 PM   Modules accepted: Orders

## 2024-08-27 ENCOUNTER — Encounter: Payer: Self-pay | Admitting: Internal Medicine

## 2024-08-27 ENCOUNTER — Ambulatory Visit: Admitting: Internal Medicine

## 2024-08-27 VITALS — BP 130/66 | HR 60 | Ht 67.0 in | Wt 146.4 lb

## 2024-08-27 DIAGNOSIS — M5416 Radiculopathy, lumbar region: Secondary | ICD-10-CM

## 2024-08-27 DIAGNOSIS — Z23 Encounter for immunization: Secondary | ICD-10-CM

## 2024-08-27 DIAGNOSIS — E039 Hypothyroidism, unspecified: Secondary | ICD-10-CM | POA: Diagnosis not present

## 2024-08-27 DIAGNOSIS — F01A4 Vascular dementia, mild, with anxiety: Secondary | ICD-10-CM

## 2024-08-27 DIAGNOSIS — H348312 Tributary (branch) retinal vein occlusion, right eye, stable: Secondary | ICD-10-CM

## 2024-08-27 DIAGNOSIS — E559 Vitamin D deficiency, unspecified: Secondary | ICD-10-CM

## 2024-08-27 DIAGNOSIS — J449 Chronic obstructive pulmonary disease, unspecified: Secondary | ICD-10-CM | POA: Diagnosis not present

## 2024-08-27 DIAGNOSIS — R630 Anorexia: Secondary | ICD-10-CM

## 2024-08-27 DIAGNOSIS — I10 Essential (primary) hypertension: Secondary | ICD-10-CM

## 2024-08-27 DIAGNOSIS — K22719 Barrett's esophagus with dysplasia, unspecified: Secondary | ICD-10-CM | POA: Diagnosis not present

## 2024-08-27 DIAGNOSIS — E538 Deficiency of other specified B group vitamins: Secondary | ICD-10-CM

## 2024-08-27 DIAGNOSIS — M48061 Spinal stenosis, lumbar region without neurogenic claudication: Secondary | ICD-10-CM | POA: Diagnosis not present

## 2024-08-27 MED ORDER — AMLODIPINE BESYLATE 10 MG PO TABS: 10.0000 mg | ORAL_TABLET | Freq: Every day | ORAL | 1 refills | Status: DC

## 2024-08-27 MED ORDER — CYANOCOBALAMIN 1000 MCG/ML IJ SOLN
1000.0000 ug | Freq: Once | INTRAMUSCULAR | Status: AC
  Administered 2024-08-27: 1000 ug via INTRAMUSCULAR

## 2024-08-27 MED ORDER — TELMISARTAN 80 MG PO TABS: 80.0000 mg | ORAL_TABLET | Freq: Every day | ORAL | 1 refills | Status: DC

## 2024-08-27 MED ORDER — MIRTAZAPINE 7.5 MG PO TABS: 7.5000 mg | ORAL_TABLET | Freq: Every day | ORAL | 1 refills | Status: AC

## 2024-08-27 NOTE — Patient Instructions (Addendum)
 John Burns has lost 15 lbs since March.  I recommend an appetite stimulating antidepressant.  I am recommending John Burns start taking mirtazapine  in the evening (with other meds) .  The starting dose is  7.5 mg .  You may increase the dose to 15 mg after one month   I have referred him back to Dr Maree at [facility]

## 2024-08-27 NOTE — Progress Notes (Unsigned)
 Subjective:  Patient ID: John Burns, male    DOB: [birth year]  Age: 83 y.o. MRN: [id number]  CC: There were no encounter diagnoses.   HPI John Burns presents for No chief complaint on file.  1) vascular dementia: diagnosed in 2021 by Dr Lane.  Per daughter he has been declining for the past 3-6 months. Forgetting to eat,  still recognizes family but conversations have become abbreviated. He has lost 15 lbs since March 2025. He is taking aricept     2)    Outpatient Medications Prior to Visit  Medication Sig Dispense Refill   amLODipine  (NORVASC ) 10 MG tablet Take 1 tablet (10 mg total) by mouth daily. 90 tablet 1   aspirin  81 MG tablet Take 1 tablet (81 mg total) by mouth daily. 90 tablet 1   atorvastatin  (LIPITOR) 80 MG tablet Take 1 tablet (80 mg total) by mouth daily. 90 tablet 3   Calcium  Carb-Cholecalciferol (CALCIUM -VITAMIN D ) 500-200 MG-UNIT tablet Take 1 tablet by mouth daily.     donepezil  (ARICEPT ) 10 MG tablet TAKE 1 TABLET(10 MG) BY MOUTH AT BEDTIME 90 tablet 1   ergocalciferol  (DRISDOL ) 1.25 MG (50000 UT) capsule Take 1 capsule (50,000 Units total) by mouth once a week. 12 capsule 0   levothyroxine  (SYNTHROID ) 50 MCG tablet TAKE 1 TABLET(50 MCG) BY MOUTH DAILY 90 tablet 0   nitroGLYCERIN  (NITROSTAT ) 0.4 MG SL tablet Place 1 tablet (0.4 mg total) under the tongue every 5 (five) minutes as needed for chest pain. 50 tablet 3   omeprazole  (PRILOSEC) 20 MG capsule TAKE 1 CAPSULE(20 MG) BY MOUTH DAILY 90 capsule 3   telmisartan  (MICARDIS ) 80 MG tablet Take 1 tablet (80 mg total) by mouth daily. 90 tablet 3   vitamin E 400 UNIT capsule Take 400 Units by mouth daily.     No facility-administered medications prior to visit.    Review of Systems;  Patient denies headache, fevers, malaise, unintentional weight loss, skin rash, eye pain, sinus congestion and sinus pain, sore throat, dysphagia,  hemoptysis , cough, dyspnea, wheezing, chest pain, palpitations, orthopnea,  edema, abdominal pain, nausea, melena, diarrhea, constipation, flank pain, dysuria, hematuria, urinary  Frequency, nocturia, numbness, tingling, seizures,  Focal weakness, Loss of consciousness,  Tremor, insomnia, depression, anxiety, and suicidal ideation.      Objective:  There were no vitals taken for this visit.  BP Readings from Last 3 Encounters:  02/13/24 138/64  10/10/23 130/66  07/11/23 (!) 180/88    Wt Readings from Last 3 Encounters:  02/13/24 161 lb 3.2 oz (73.1 kg)  01/31/24 160 lb (72.6 kg)  07/11/23 160 lb 6.4 oz (72.8 kg)    Physical Exam  Lab Results  Component Value Date   HGBA1C 5.6 02/13/2024   HGBA1C 5.5 01/10/2023   HGBA1C 6.2 07/09/2022    Lab Results  Component Value Date   CREATININE 1.21 08/20/2024   CREATININE 1.18 02/13/2024   CREATININE 1.02 07/11/2023    Lab Results  Component Value Date   WBC 6.6 02/13/2024   HGB 14.3 02/13/2024   HCT 42.7 02/13/2024   PLT 176.0 02/13/2024   GLUCOSE 97 08/20/2024   CHOL 148 02/13/2024   TRIG 124.0 02/13/2024   HDL 48.50 02/13/2024   LDLDIRECT 77.0 02/13/2024   LDLCALC 75 02/13/2024   ALT 8 08/20/2024   AST 12 08/20/2024   NA 139 08/20/2024   K 3.8 08/20/2024   CL 104 08/20/2024   CREATININE 1.21 08/20/2024   BUN 15 08/20/2024  CO2 27 08/20/2024   TSH 3.44 08/20/2024   PSA 2.33 05/22/2015   HGBA1C 5.6 02/13/2024   MICROALBUR <0.7 02/13/2024    MR LUMBAR SPINE WO CONTRAST Result Date: 06/14/2022 CLINICAL DATA:  Lumbar radiculitis. Chronic low back pain with left-sided sciatica. EXAM: MRI LUMBAR SPINE WITHOUT CONTRAST TECHNIQUE: Multiplanar, multisequence MR imaging of the lumbar spine was performed. No intravenous contrast was administered. COMPARISON:  Lumbar spine MRI 04/18/2020 FINDINGS: Segmentation:  Standard. Alignment:  Unchanged trace retrolisthesis of L2 on L3. Vertebrae: No fracture, suspicious marrow lesion, or significant marrow edema. Chronic degenerative endplate changes at  T11-12, L2-3, and L5-S1. Conus medullaris and cauda equina: Conus extends to the L1-2 level. Unchanged small lipoma of the filum. Paraspinal and other soft tissues: Left renal sinus cysts. Disc levels: Disc desiccation throughout the lumbar and lower thoracic spine. Unchanged severe disc space narrowing at T11-12 and L5-S1 and moderate narrowing at L2-3. T12-L1: No disc herniation or stenosis. L1-2: Disc bulging results in mild left neural foraminal stenosis without spinal stenosis, unchanged. L2-3: Disc bulging, a chronic left subarticular disc extrusion with mild cranial migration, and mild facet hypertrophy result in severe left lateral recess and moderate left neural foraminal stenosis with potential left L2 and L3 nerve root impingement. The morphology of the disc extrusion is mildly different than in 2021, overall appearing perhaps slightly smaller but with slightly greater medial extent in the lateral recess. The overall degree of stenosis is similar to the prior MRI. Borderline spinal stenosis. L3-4: Mild disc bulging with central and left foraminal annular fissures and mild facet hypertrophy, unchanged. No significant stenosis. L4-5: Disc bulging, a right central disc protrusion with annular fissure, and mild facet hypertrophy result in mild bilateral lateral recess stenosis and borderline spinal stenosis without neural foraminal stenosis, unchanged. L5-S1: Circumferential disc osteophyte complex and mild facet hypertrophy result in minimal bilateral lateral recess stenosis and moderate bilateral neural foraminal stenosis without spinal stenosis, unchanged. IMPRESSION: Multilevel lumbar disc and facet degeneration without significant interval change, most notable at L2-3 where a chronic disc extrusion results in left lateral recess and left neural foraminal stenosis. Electronically Signed   By: Dasie Hamburg M.D.   On: 06/14/2022 18:25    Assessment & Plan:  .There are no diagnoses linked to this  encounter.   I spent 34 minutes on the day of this face to face encounter reviewing patient's  most recent visit with cardiology,  nephrology,  and neurology,  prior relevant surgical and non surgical procedures, recent  labs and imaging studies, counseling on weight management,  reviewing the assessment and plan with patient, and post visit ordering and reviewing of  diagnostics and therapeutics with patient  .   Follow-up: No follow-ups on file.   Verneita LITTIE Kettering, MD

## 2024-08-28 ENCOUNTER — Encounter: Payer: Self-pay | Admitting: Internal Medicine

## 2024-08-28 ENCOUNTER — Telehealth: Payer: Self-pay

## 2024-08-28 DIAGNOSIS — H348312 Tributary (branch) retinal vein occlusion, right eye, stable: Secondary | ICD-10-CM | POA: Insufficient documentation

## 2024-08-28 DIAGNOSIS — R630 Anorexia: Secondary | ICD-10-CM | POA: Insufficient documentation

## 2024-08-28 NOTE — Assessment & Plan Note (Signed)
 His chronic pain is managed with periodic ESI  by [facility],  Last one in August 2023.  He denies daily pain with scheduled use of 1000 mg tylenol  q 12 hours

## 2024-08-28 NOTE — Assessment & Plan Note (Signed)
 He has been prescribed remeron  in the past but did not start medication.  Repeat trial starting dose 7.5 mg qhs

## 2024-08-28 NOTE — Assessment & Plan Note (Signed)
 Diagnosed initially in 2021 with MRI noting lacunar infarcts.  He is tolerating aricept  but has become more forgetful and less interactive  and has lost a significant amount of weight.  Referraing to Dr Maree for evaluation per daughter's request

## 2024-08-28 NOTE — Assessment & Plan Note (Signed)
 Managed with  omeprazole  taken every  evening .  No symptoms of cough, reflux or dysphagia .  Last EGD was in 2015.  No follow up given age

## 2024-08-28 NOTE — Progress Notes (Unsigned)
 Complex Care Management Note Care Guide Note  08/28/2024 Name: John Burns MRN: [id number] DOB: February 03, 1941   Complex Care Management Outreach Attempts: An unsuccessful telephone outreach was attempted today to offer the patient information about available complex care management services.  Follow Up Plan:  Additional outreach attempts will be made to offer the patient complex care management information and services.   Encounter Outcome:  No Answer-Patient's wife said he would call back to schedule.  Leotis Rase [facility], [facility] Guide  Direct Dial: [phone]  Fax [phone]

## 2024-08-28 NOTE — Assessment & Plan Note (Signed)
 daughter is not sure how adherent patient is with dosing; TSH is WNL  Lab Results  Component Value Date   TSH 3.44 08/20/2024

## 2024-08-28 NOTE — Assessment & Plan Note (Signed)
 BP improved on amlodipine  10 mg daily .and telmisartan  80 mg daily  . NO CHANGES TODAY   Lab Results  Component Value Date   CREATININE 1.21 08/20/2024

## 2024-08-28 NOTE — Assessment & Plan Note (Signed)
 Managed with monthly b12 injections ,  Given today In office   Lab Results  Component Value Date   VITAMINB12 435 07/01/2021

## 2024-08-29 NOTE — Progress Notes (Signed)
 Complex Care Management Note  Care Guide Note 08/29/2024 Name: John Burns MRN: [id number] DOB: 01-25-1941  John Burns is a 83 y.o. year old male who sees Marylynn, Verneita LITTIE, MD for primary care. I reached out to Debby LITTIE Blush by phone today to offer complex care management services.  Mr. Hsiung was given information about Complex Care Management services today including:   The Complex Care Management services include support from the care team which includes your Nurse Care Manager, Clinical Social Worker, or Pharmacist.  The Complex Care Management team is here to help remove barriers to the health concerns and goals most important to you. Complex Care Management services are voluntary, and the patient may decline or stop services at any time by request to their care team member.   Complex Care Management Consent Status: Patient agreed to services and verbal consent obtained.   Follow up plan:  Telephone appointment with complex care management team member scheduled for:  09/19/24 @ 10 AM  Encounter Outcome:  Patient Scheduled  Leotis Rase [facility], [facility] Guide  Direct Dial: [phone]  Fax [phone]

## 2024-09-19 ENCOUNTER — Other Ambulatory Visit: Payer: Self-pay | Admitting: *Deleted

## 2024-09-20 NOTE — Patient Instructions (Signed)
 Visit Information  Thank you for taking time to visit with me today. Please don't hesitate to contact me if I can be of assistance to you before our next scheduled appointment.  Our next appointment is by telephone on 09/28/24 at 10:30am Please call the care guide team at [phone] if you need to cancel or reschedule your appointment.   Following is a copy of your care plan:   Goals Addressed             This Visit's Progress    VBCI Social Work Care Plan       Problems:   Care Coordination needs related to Dementia: Vascular dementia  CSW Clinical Goal(s):   Over the next 30  days the Patient/Patient's family will work with [facility]'s Guide Program  to address needs related to Dementia Support.  Interventions:  Dementia Care:   Current level of care: Home with other family or significant other(s): spouse  Evaluation of patient safety in current living environment and review of Dementia resources and support (patient's daughter resides across the street and is able to assist with patient's daily care however additional support may be needed) Caregiver stress acknowledged :discussed options for additional support including referral to the GUIDE Dementia Support program through [facility] Collaborated with [facility]-referral to the GUIDE program completed Consideration on in-home help encouraged : options discussed Discussed caregiver resources and support: VM left for the [facility] to complete referral for meals on wheels Solution-Focused Strategies employed: encouraged follow up with the Guide Program and Meals on Wheels-continued consideration for private duty care  Patient Goals/Self-Care Activities:  Follow up on meals on wheels referral and the Guide Program  Plan:   Telephone follow up appointment with care management team member scheduled for:  09/28/24        Please call the Suicide and Crisis Lifeline: 988 call the [place]  National Suicide  Prevention Lifeline: 571 680 6039 or TTY: (435)614-7187 TTY 623 430 4765) to talk to a trained counselor call 1-800-273-TALK (toll free, 24 hour hotline) call 911 if you are experiencing a Mental Health or Behavioral Health Crisis or need someone to talk to.  Patient's daughter verbalized understanding of Care plan and visit instructions communicated this visit  Talisa Petrak, LCSW [place]  [facility], [facility] Licensed Clinical Social Worker  Direct Dial: [phone]

## 2024-09-20 NOTE — Patient Outreach (Addendum)
 Complex Care Management   Visit Note  09/20/2024  Name:  John Burns MRN: [id number] DOB: 10-08-1941  Situation: Referral received for Complex Care Management related to Dementia I obtained verbal consent from Patient.  Visit completed with Patient's daughter on the phone on 09/19/24.  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    Assessment: Patient Reported Symptoms:  Cognitive Cognitive Status: Difficulties with attention and concentration, Poor judgment in daily scenarios, Requires Assistance Decision Making Cognitive/Intellectual Conditions Management [RPT]: Other Other: vascular dementia   Health Maintenance Behaviors: Annual physical exam Healing Pattern: Slow Health Facilitated by: Rest  Neurological Neurological Review of Symptoms: No symptoms reported Neurological Comment: patient has been diagnosed with vascular dementia  HEENT HEENT Symptoms Reported: No symptoms reported      Cardiovascular Cardiovascular Symptoms Reported: No symptoms reported    Respiratory Respiratory Symptoms Reported: No symptoms reported    Endocrine Endocrine Symptoms Reported: No symptoms reported Is patient diabetic?: No    Gastrointestinal Gastrointestinal Symptoms Reported: Change in appetite Additional Gastrointestinal Details: eats small meals,      Genitourinary Genitourinary Symptoms Reported: No symptoms reported     Integumentary Integumentary Symptoms Reported: Bruising Additional Integumentary Details: skin is thin, brusises easily    Musculoskeletal Musculoskelatal Symptoms Reviewed: Back pain Additional Musculoskeletal Details: per daughter, patient has had back pain since his 30's Musculoskeletal Management Strategies: Medication therapy (per daughter, johana takes tylenol  for pain) Falls in the past year?: No    Psychosocial Psychosocial Symptoms Reported: No symptoms reported Additional Psychological Details: patient has been diagnosed with vascular dementia, daughter seeing a decline in his memory and maintaining his hygiene. Patient has lost over 14 pounds in the last several months-often sits Behavioral Management Strategies: Adequate rest, Medication therapy Behavioral Health Self-Management Outcome: 3 (uncertain) Major Change/Loss/Stressor/Fears (CP): Medical condition, self Behaviors When Feeling Stressed/Fearful: goes to ball games with grandchildren at times, talks to friends and neighbors when they visit Techniques to [place] with Loss/Stress/Change: Medication Quality of Family Relationships: involved, helpful, supportive Do you feel physically threatened by others?: No    09/20/2024    PHQ2-9 Depression Screening   Little interest or pleasure in doing things Not at all  Feeling down, depressed, or hopeless Not at all  PHQ-2 - Total Score 0  Trouble falling or staying asleep, or sleeping too much    Feeling tired or having little energy    Poor appetite or overeating     Feeling bad about yourself - or that you are a failure or have let yourself or your family down    Trouble concentrating on things, such as reading the newspaper or watching television    Moving or speaking so slowly that other people could have noticed.  Or the opposite - being so fidgety or restless that you have been moving around a lot more than usual  Thoughts that you would be better off dead, or hurting  yourself in some way    PHQ2-9 Total Score    If you checked off any problems, how difficult have these problems made it for you to do your work, take care of things at home, or get along with other people    Depression Interventions/Treatment      There were no vitals filed for this visit.  Medications Reviewed Today     Reviewed by Ermalinda Lenn HERO, LCSW (Social Worker) on 09/19/24 at 1043  Med List Status: <None>   Medication Order Taking? Sig Documenting Provider Last Dose Status Informant  amLODipine  (NORVASC ) 10 MG tablet [id number] Yes Take 1 tablet (10 mg total) by mouth daily. Marylynn Verneita CROME, MD  Active   aspirin  81 MG tablet [id number] Yes Take 1 tablet (81 mg total) by mouth daily. Marylynn Verneita CROME, MD  Active   atorvastatin  (LIPITOR) 80 MG tablet [id number] Yes Take 1 tablet (80 mg total) by mouth daily. Marylynn Verneita CROME, MD  Active   Calcium  Carb-Cholecalciferol (CALCIUM -VITAMIN D ) 500-200 MG-UNIT tablet [id number]  Take 1 tablet by mouth daily.  Patient not taking: Reported on 09/19/2024   [provider]  Active Self  donepezil  (ARICEPT ) 10 MG tablet [id number] Yes TAKE 1 TABLET(10 MG) BY MOUTH AT BEDTIME Marylynn Verneita CROME, MD  Active   ergocalciferol  (DRISDOL ) 1.25 MG (50000 UT) capsule [id number] Yes Take 1 capsule (50,000 Units total) by mouth once a week. Tullo, Teresa L, MD  Active   levothyroxine  (SYNTHROID ) 50 MCG tablet [id number] Yes TAKE 1 TABLET(50 MCG) BY MOUTH DAILY Tullo, Teresa L, MD  Active   mirtazapine  (REMERON ) 7.5 MG tablet [id number] Yes Take 1 tablet (7.5 mg total) by mouth at bedtime. Marylynn Verneita CROME, MD  Active   nitroGLYCERIN  (NITROSTAT ) 0.4 MG SL tablet [id number] Yes Place 1 tablet (0.4 mg total) under the tongue every 5 (five) minutes as needed for chest pain. Marylynn Verneita CROME, MD  Active   omeprazole  (PRILOSEC) 20 MG capsule [id number] Yes TAKE 1 CAPSULE(20 MG) BY MOUTH DAILY Tullo, Teresa L, MD  Active   telmisartan  (MICARDIS ) 80 MG tablet [id number]  Yes Take 1 tablet (80 mg total) by mouth daily. Marylynn Verneita CROME, MD  Active   vitamin E 400 UNIT capsule [id number] Yes Take 400 Units by mouth daily. [provider]  Active             Recommendation:   PCP Follow-up Referral to Meals on Wheels-left voicemail for a return call Referral to the Guide Dementia Support Program-completed  Follow Up Plan:   Telephone follow up appointment date/time:  09/28/24   Lenn Ermalinda, LCSW [place]  [facility], [facility] Licensed Clinical Social Worker  Direct Dial: [phone]

## 2024-09-27 ENCOUNTER — Ambulatory Visit (INDEPENDENT_AMBULATORY_CARE_PROVIDER_SITE_OTHER): Admitting: Internal Medicine

## 2024-09-27 ENCOUNTER — Encounter: Payer: Self-pay | Admitting: Internal Medicine

## 2024-09-27 VITALS — BP 124/74 | HR 67 | Ht 67.0 in | Wt 146.6 lb

## 2024-09-27 DIAGNOSIS — E538 Deficiency of other specified B group vitamins: Secondary | ICD-10-CM

## 2024-09-27 DIAGNOSIS — R944 Abnormal results of kidney function studies: Secondary | ICD-10-CM

## 2024-09-27 DIAGNOSIS — J449 Chronic obstructive pulmonary disease, unspecified: Secondary | ICD-10-CM

## 2024-09-27 DIAGNOSIS — N39498 Other specified urinary incontinence: Secondary | ICD-10-CM | POA: Diagnosis not present

## 2024-09-27 DIAGNOSIS — F01A4 Vascular dementia, mild, with anxiety: Secondary | ICD-10-CM

## 2024-09-27 DIAGNOSIS — R3981 Functional urinary incontinence: Secondary | ICD-10-CM

## 2024-09-27 LAB — URINALYSIS, ROUTINE W REFLEX MICROSCOPIC
Bilirubin Urine: NEGATIVE
Hgb urine dipstick: NEGATIVE
Leukocytes,Ua: NEGATIVE
Nitrite: NEGATIVE
RBC / HPF: NONE SEEN (ref 0–?)
Specific Gravity, Urine: >=1.03 — AB (ref 1.000–1.030)
Urine Glucose: NEGATIVE
Urobilinogen, UA: 0.2 (ref 0.0–1.0)
WBC, UA: NONE SEEN (ref 0–?)
pH: 5.5 (ref 5.0–8.0)

## 2024-09-27 LAB — BASIC METABOLIC PANEL WITH GFR
BUN: 14 mg/dL (ref 6–23)
CO2: 27 meq/L (ref 19–32)
Calcium: 9.7 mg/dL (ref 8.4–10.5)
Chloride: 104 meq/L (ref 96–112)
Creatinine, Ser: 1.13 mg/dL (ref 0.40–1.50)
GFR: 59.95 mL/min — ABNORMAL LOW (ref >60.00)
Glucose, Bld: 93 mg/dL (ref 70–99)
Potassium: 3.8 meq/L (ref 3.5–5.1)
Sodium: 138 meq/L (ref 135–145)

## 2024-09-27 MED ORDER — LEVOTHYROXINE SODIUM 50 MCG PO TABS: ORAL_TABLET | ORAL | 1 refills | Status: AC

## 2024-09-27 MED ORDER — CYANOCOBALAMIN 1000 MCG/ML IJ SOLN
1000.0000 ug | Freq: Once | INTRAMUSCULAR | Status: AC
  Administered 2024-09-27: 1000 ug via INTRAMUSCULAR

## 2024-09-27 NOTE — Progress Notes (Signed)
 Subjective:  Patient ID: John Burns, male    DOB: May 05, 1941  Age: 83 y.o. MRN: [id number]  CC: The primary encounter diagnosis was Frequent urinary incontinence. 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   HPI John Burns presents for  Chief Complaint  Patient presents with   Medical Management of Chronic Issues    1 month follow up     Vascular dementia, weight loss .  Patient is accompanied by his wife Dagoberto and his daughter Verneita Her.SABRA  He was seen one month ago,  ongoing weight loss addressed.  Finally started taking mirtazapine  one month ago after multiple attempts to prescribe.  Has not lost any weight since last month.  Wife notes that his appetite seems fine, sleeping until later in the morning.  Wife is unable to provide more detail   Wife is reluctant to discuss his episodes of agitation .  Daughter Verneita reports one incident recently that resulted in wife falling down she tried to direct him by pulling on his coat.     Chronic back pain. Verneita has not been  scheduling tylenol   despite patient's frequent reports of back  pain ,  but uses a morning and evening pill box.    Patient is taking his medications as prescribed and notes no adverse effects.  Home BP readings have been done about once per week and are  generally < 130/80 .  She is avoiding added salt in her diet and walking regularly about 3 times per week for exercise  .   Urinary incontinence: new issue  not occurring frequently  but often at night.   Outpatient Medications Prior to Visit  Medication Sig Dispense Refill   amLODipine  (NORVASC ) 10 MG tablet Take 1 tablet (10 mg total) by mouth daily. 90 tablet 1   aspirin  81 MG tablet Take 1 tablet (81 mg total) by mouth daily. 90 tablet 1   atorvastatin   (LIPITOR) 80 MG tablet Take 1 tablet (80 mg total) by mouth daily. 90 tablet 3   donepezil  (ARICEPT ) 10 MG tablet TAKE 1 TABLET(10 MG) BY MOUTH AT BEDTIME 90 tablet 1   ergocalciferol  (DRISDOL ) 1.25 MG (50000 UT) capsule Take 1 capsule (50,000 Units total) by mouth once a week. 12 capsule 0   mirtazapine  (REMERON ) 7.5 MG tablet Take 1 tablet (7.5 mg total) by mouth at bedtime. 90 tablet 1   nitroGLYCERIN  (NITROSTAT ) 0.4 MG SL tablet Place 1 tablet (0.4 mg total) under the tongue every 5 (five) minutes as needed for chest pain. 50 tablet 3   omeprazole  (PRILOSEC) 20 MG capsule TAKE 1 CAPSULE(20 MG) BY MOUTH DAILY 90 capsule 3   telmisartan  (MICARDIS ) 80 MG tablet Take 1 tablet (80 mg total) by mouth daily. 90 tablet 1   vitamin E 400 UNIT capsule Take 400 Units by mouth daily.     Calcium  Carb-Cholecalciferol (CALCIUM -VITAMIN D ) 500-200 MG-UNIT tablet Take 1 tablet by mouth daily. (Patient not taking: Reported on 09/19/2024)     levothyroxine  (SYNTHROID ) 50 MCG tablet TAKE 1 TABLET(50 MCG) BY MOUTH DAILY 90 tablet 0   No facility-administered medications prior to visit.    Review of Systems;  Patient denies headache, fevers, malaise, unintentional weight loss, skin rash, eye pain, sinus congestion and sinus pain, sore throat, dysphagia,  hemoptysis , cough, dyspnea, wheezing, chest pain,  palpitations, orthopnea, edema, abdominal pain, nausea, melena, diarrhea, constipation, flank pain, dysuria, hematuria, urinary  Frequency, nocturia, numbness, tingling, seizures,  Focal weakness, Loss of consciousness,  Tremor, insomnia, depression, anxiety, and suicidal ideation.      Objective:  BP 124/74   Pulse 67   Ht 5' 7 (1.702 m)   Wt 146 lb 9.6 oz (66.5 kg)   SpO2 97%   BMI 22.96 kg/m   BP Readings from Last 3 Encounters:  09/27/24 124/74  08/27/24 130/66  02/13/24 138/64    Wt Readings from Last 3 Encounters:  09/27/24 146 lb 9.6 oz (66.5 kg)  08/27/24 146 lb 6.4 oz (66.4 kg)   02/13/24 161 lb 3.2 oz (73.1 kg)    Physical Exam Vitals reviewed.  Constitutional:      General: He is not in acute distress.    Appearance: Normal appearance. He is normal weight. He is not ill-appearing, toxic-appearing or diaphoretic.  HENT:     Head: Normocephalic.  Eyes:     General: No scleral icterus.       Right eye: No discharge.        Left eye: No discharge.     Conjunctiva/sclera: Conjunctivae normal.  Cardiovascular:     Rate and Rhythm: Normal rate and regular rhythm.     Heart sounds: Normal heart sounds.  Pulmonary:     Effort: Pulmonary effort is normal. No respiratory distress.     Breath sounds: Normal breath sounds.  Musculoskeletal:        General: Normal range of motion.     Cervical back: Normal range of motion.  Skin:    General: Skin is warm and dry.  Neurological:     General: No focal deficit present.     Mental Status: He is alert and oriented to person, place, and time. Mental status is at baseline.  Psychiatric:        Mood and Affect: Mood normal.        Behavior: Behavior normal.        Thought Content: Thought content normal.        Judgment: Judgment normal.     Lab Results  Component Value Date   HGBA1C 5.6 02/13/2024   HGBA1C 5.5 01/10/2023   HGBA1C 6.2 07/09/2022    Lab Results  Component Value Date   CREATININE 1.13 09/27/2024   CREATININE 1.21 08/20/2024   CREATININE 1.18 02/13/2024    Lab Results  Component Value Date   WBC 6.6 02/13/2024   HGB 14.3 02/13/2024   HCT 42.7 02/13/2024   PLT 176.0 02/13/2024   GLUCOSE 93 09/27/2024   CHOL 148 02/13/2024   TRIG 124.0 02/13/2024   HDL 48.50 02/13/2024   LDLDIRECT 77.0 02/13/2024   LDLCALC 75 02/13/2024   ALT 8 08/20/2024   AST 12 08/20/2024   NA 138 09/27/2024   K 3.8 09/27/2024   CL 104 09/27/2024   CREATININE 1.13 09/27/2024   BUN 14 09/27/2024   CO2 27 09/27/2024   TSH 3.44 08/20/2024   PSA 2.33 05/22/2015   HGBA1C 5.6 02/13/2024   MICROALBUR <0.7  02/13/2024    MR LUMBAR SPINE WO CONTRAST Result Date: 06/14/2022 CLINICAL DATA:  Lumbar radiculitis. Chronic low back pain with left-sided sciatica. EXAM: MRI LUMBAR SPINE WITHOUT CONTRAST TECHNIQUE: Multiplanar, multisequence MR imaging of the lumbar spine was performed. No intravenous contrast was administered. COMPARISON:  Lumbar spine MRI 04/18/2020 FINDINGS: Segmentation:  Standard. Alignment:  Unchanged trace retrolisthesis of L2 on L3. Vertebrae:  No fracture, suspicious marrow lesion, or significant marrow edema. Chronic degenerative endplate changes at T11-12, L2-3, and L5-S1. Conus medullaris and cauda equina: Conus extends to the L1-2 level. Unchanged small lipoma of the filum. Paraspinal and other soft tissues: Left renal sinus cysts. Disc levels: Disc desiccation throughout the lumbar and lower thoracic spine. Unchanged severe disc space narrowing at T11-12 and L5-S1 and moderate narrowing at L2-3. T12-L1: No disc herniation or stenosis. L1-2: Disc bulging results in mild left neural foraminal stenosis without spinal stenosis, unchanged. L2-3: Disc bulging, a chronic left subarticular disc extrusion with mild cranial migration, and mild facet hypertrophy result in severe left lateral recess and moderate left neural foraminal stenosis with potential left L2 and L3 nerve root impingement. The morphology of the disc extrusion is mildly different than in 2021, overall appearing perhaps slightly smaller but with slightly greater medial extent in the lateral recess. The overall degree of stenosis is similar to the prior MRI. Borderline spinal stenosis. L3-4: Mild disc bulging with central and left foraminal annular fissures and mild facet hypertrophy, unchanged. No significant stenosis. L4-5: Disc bulging, a right central disc protrusion with annular fissure, and mild facet hypertrophy result in mild bilateral lateral recess stenosis and borderline spinal stenosis without neural foraminal stenosis,  unchanged. L5-S1: Circumferential disc osteophyte complex and mild facet hypertrophy result in minimal bilateral lateral recess stenosis and moderate bilateral neural foraminal stenosis without spinal stenosis, unchanged. IMPRESSION: Multilevel lumbar disc and facet degeneration without significant interval change, most notable at L2-3 where a chronic disc extrusion results in left lateral recess and left neural foraminal stenosis. Electronically Signed   By: Dasie Hamburg M.D.   On: 06/14/2022 18:25    Assessment & Plan:  .Frequent urinary incontinence -     Urinalysis, Routine w reflex microscopic -     Urine Culture  B12 deficiency Assessment & Plan: Managed with monthly b12 injections ,  Given In office   Lab Results  Component Value Date   VITAMINB12 435 07/01/2021     Orders: -     Cyanocobalamin   Decreased GFR -     Basic metabolic panel with GFR  Mild vascular dementia with anxiety [facility]) Assessment & Plan: Diagnosed initially in 2021 with MRI noting lacunar infarcts.  He is tolerating Aricept  but has become more forgetful and less interactive  and has lost a significant amount of weight.   Which has been addressed with mirtazapine  .  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    Chronic obstructive pulmonary disease, unspecified COPD type (HCC) Assessment & Plan: He remains asymptomatic largely due to sedentary lifestyle    Other orders -     Levothyroxine  Sodium; TAKE 1 TABLET(50 MCG) BY MOUTH DAILY  Dispense: 90 tablet; Refill: 1   Follow-up: Return in about 3 months (around 12/28/2024).   Verneita LITTIE Kettering, MD

## 2024-09-27 NOTE — Patient Instructions (Addendum)
 Please schedule 1000 mg of tylenol  morning and evening for both parents  to manage their arthritis pain   I am checking a urine today to rule out UTI  The mirtazipine seems to be working.  Dad has not lost any weight since last visit

## 2024-09-28 ENCOUNTER — Other Ambulatory Visit: Payer: Self-pay | Admitting: *Deleted

## 2024-09-28 LAB — URINE CULTURE
MICRO NUMBER:: 17200730
Result:: NO GROWTH
SPECIMEN QUALITY:: ADEQUATE

## 2024-09-28 NOTE — Patient Instructions (Signed)
 Visit Information  Thank you for taking time to visit with me today. Please don't hesitate to contact me if I can be of assistance to you before our next scheduled appointment.  Your next care management appointment is by telephone on 10/12/24 at 9am   Please call the care guide team at [phone] if you need to cancel, schedule, or reschedule an appointment.   Please call the Suicide and Crisis Lifeline: 988 call the [place]  National Suicide Prevention Lifeline: (825)585-6729 or TTY: 254-821-7459 TTY 671-166-1217) to talk to a trained counselor call 1-800-273-TALK (toll free, 24 hour hotline) call 911 if you are experiencing a Mental Health or Behavioral Health Crisis or need someone to talk to.  Arad Burston, LCSW [place]  [facility], [facility] Health Licensed Clinical Social Worker  Direct Dial: [phone]  \

## 2024-09-28 NOTE — Patient Outreach (Addendum)
 Complex Care Management   Visit Note  09/28/2024  Name:  John Burns MRN: [id number] DOB: 14-Apr-1941  Situation: Referral received for Complex Care Management related to Dementia I obtained verbal consent from Patient.  Visit completed with Patient's daughter on phone.   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    Assessment: Patient Reported Symptoms:  Cognitive Cognitive Status: Difficulties with attention and concentration, Poor judgment in daily scenarios, Requires Assistance Decision Making Cognitive/Intellectual Conditions Management [RPT]: Other Other: vascular  dementia   Health Maintenance Behaviors: Annual physical exam Healing Pattern: Slow Health Facilitated by: Rest  Neurological Neurological Review of Symptoms: No symptoms reported    HEENT HEENT Symptoms Reported: No symptoms reported      Cardiovascular Cardiovascular Symptoms Reported: No symptoms reported    Respiratory Respiratory Symptoms Reported: No symptoms reported    Endocrine Endocrine Symptoms Reported: No symptoms reported Is patient diabetic?: No    Gastrointestinal Gastrointestinal Symptoms Reported: No symptoms reported      Genitourinary Genitourinary Symptoms Reported: Incontinence Additional Genitourinary Details: starting to urinate on the floor, due to possible confusion during the night Genitourinary Comment: urine collected  yesterday during appt with pcp to rule out UTI  Integumentary Integumentary Symptoms Reported: Bruising Additional Integumentary Details: continues to bruise easily, takes a aspirin  daily    Musculoskeletal Musculoskelatal Symptoms Reviewed: Back pain, Limited mobility Additional Musculoskeletal Details: staggers at times, continued ongoing back pain Musculoskeletal Management Strategies: Medication therapy Musculoskeletal Comment: discussed back pain with provider on 09/27/24 who suggested adding tylenol  to his pill box      Psychosocial Psychosocial Symptoms Reported: No symptoms reported Additional Psychological Details: vascular dementia, referral made to the GUIDE program-they will visit patient and spouse on 10/01/24 Behavioral Management Strategies: Adequate rest, Medication therapy Major Change/Loss/Stressor/Fears (CP): Medical condition, self Techniques to Cope with Loss/Stress/Change: Medication      09/28/2024    PHQ2-9 Depression Screening   Little interest or pleasure in doing things    Feeling down, depressed, or hopeless    PHQ-2 - Total Score    Trouble falling or staying asleep, or sleeping too much    Feeling tired or having little energy    Poor appetite or overeating     Feeling bad about yourself - or that you are a failure or have let yourself or your family down    Trouble concentrating on things, such as reading the newspaper or watching television    Moving or speaking so slowly that other people could have noticed.  Or the opposite - being so fidgety or restless that you have been moving around a lot more than usual    Thoughts that you would be better off dead, or hurting yourself in some way    PHQ2-9 Total Score    If you checked off any problems, how difficult have these  problems made it for you to do your work, take care of things at home, or get along with other people    Depression Interventions/Treatment      There were no vitals filed for this  visit.  Medications Reviewed Today     Reviewed by Ermalinda Lenn HERO, LCSW (Social Worker) on 09/28/24 at 1048  Med List Status: <None>   Medication Order Taking? Sig Documenting Provider Last Dose Status Informant  amLODipine  (NORVASC ) 10 MG tablet [id number] Yes Take 1 tablet (10 mg total) by mouth daily. Marylynn Verneita CROME, MD  Active   aspirin  81 MG tablet [id number] Yes Take 1 tablet (81 mg total) by mouth daily. Marylynn Verneita CROME, MD  Active   atorvastatin  (LIPITOR) 80 MG tablet [id number] Yes Take 1 tablet (80 mg total) by mouth daily. Marylynn Verneita CROME, MD  Active   donepezil  (ARICEPT ) 10 MG tablet [id number] Yes TAKE 1 TABLET(10 MG) BY MOUTH AT BEDTIME Tullo, Teresa L, MD  Active   ergocalciferol  (DRISDOL ) 1.25 MG (50000 UT) capsule [id number] Yes Take 1 capsule (50,000 Units total) by mouth once a week. Tullo, Teresa L, MD  Active   levothyroxine  (SYNTHROID ) 50 MCG tablet [id number] Yes TAKE 1 TABLET(50 MCG) BY MOUTH DAILY Tullo, Teresa L, MD  Active   mirtazapine  (REMERON ) 7.5 MG tablet [id number] Yes Take 1 tablet (7.5 mg total) by mouth at bedtime. Marylynn Verneita CROME, MD  Active   nitroGLYCERIN  (NITROSTAT ) 0.4 MG SL tablet [id number] Yes Place 1 tablet (0.4 mg total) under the tongue every 5 (five) minutes as needed for chest pain. Marylynn Verneita CROME, MD  Active   omeprazole  (PRILOSEC) 20 MG capsule [id number] Yes TAKE 1 CAPSULE(20 MG) BY MOUTH DAILY Tullo, Teresa L, MD  Active   telmisartan  (MICARDIS ) 80 MG tablet [id number] Yes Take 1 tablet (80 mg total) by mouth daily. Marylynn Verneita CROME, MD  Active   vitamin E 400 UNIT capsule [id number] Yes Take 400 Units by mouth daily. [provider]  Active             Recommendation:   PCP Follow-up Guide Program-dementia support 10/01/24 1:30pm  Follow Up Plan:   Telephone follow up appointment date/time:  10/12/24  Lenn Ermalinda, LCSW [place]  [facility], [facility] Health Licensed Clinical Social Worker  Direct Dial:  [phone]

## 2024-09-29 DIAGNOSIS — R944 Abnormal results of kidney function studies: Secondary | ICD-10-CM | POA: Insufficient documentation

## 2024-09-29 DIAGNOSIS — R3981 Functional urinary incontinence: Secondary | ICD-10-CM | POA: Insufficient documentation

## 2024-09-29 DIAGNOSIS — N39498 Other specified urinary incontinence: Secondary | ICD-10-CM | POA: Insufficient documentation

## 2024-09-29 NOTE — Assessment & Plan Note (Signed)
 He remains asymptomatic largely due to sedentary lifestyle

## 2024-09-29 NOTE — Assessment & Plan Note (Addendum)
 Managed with monthly b12 injections ,  Given In office   Lab Results  Component Value Date   VITAMINB12 435 07/01/2021

## 2024-09-29 NOTE — Assessment & Plan Note (Signed)
 Diagnosed initially in 2021 with MRI noting lacunar infarcts.  He is tolerating Aricept  but has become more forgetful and less interactive  and has lost a significant amount of weight.   Which has been addressed with mirtazapine  .  Referring  to Dr Maree for evaluation per daughter's request

## 2024-09-29 NOTE — Assessment & Plan Note (Signed)
 UTI ruled out today,  recommend timed bathroom breaks every 2 hours to decrease occurrences.

## 2024-10-01 ENCOUNTER — Other Ambulatory Visit: Payer: Self-pay | Admitting: Internal Medicine

## 2024-10-01 DIAGNOSIS — I10 Essential (primary) hypertension: Secondary | ICD-10-CM

## 2024-10-12 ENCOUNTER — Other Ambulatory Visit: Payer: Self-pay | Admitting: *Deleted

## 2024-10-12 ENCOUNTER — Encounter: Payer: Self-pay | Admitting: *Deleted

## 2024-10-12 NOTE — Patient Outreach (Signed)
 Complex Care Management   Visit Note  10/12/2024  Name:  John Burns MRN: [id number] DOB: [birth year]  Situation: Referral received for Complex Care Management related to caregvier support I obtained verbal consent from Caregiver.  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    Assessment: Patient Reported Symptoms:  Cognitive Cognitive Status: Difficulties with attention and concentration, Poor judgment in daily scenarios, Requires Assistance Decision Making Cognitive/Intellectual Conditions Management [RPT]: Other Other: vascular dementia   Health Maintenance Behaviors: Annual physical exam Healing Pattern: Slow Health Facilitated by: Rest  Neurological Neurological Review of Symptoms: No symptoms reported Neurological Comment: Has follow up scheduled with Neurologist in February 2026  HEENT HEENT Symptoms Reported: No symptoms reported      Cardiovascular Cardiovascular Symptoms Reported: No symptoms reported    Respiratory Respiratory Symptoms Reported: No symptoms reported    Endocrine Endocrine Symptoms Reported: No symptoms reported    Gastrointestinal Gastrointestinal Symptoms Reported: No symptoms reported      Genitourinary Genitourinary Symptoms Reported: Incontinence Genitourinary Comment: urine collected 09/27/24-not UTI did find that he may have been hydration   Integumentary Integumentary Symptoms Reported: Bruising Additional Integumentary Details: continues to bruise easily, takes aspirin  daily Skin Management Strategies: Routine screening  Musculoskeletal Musculoskelatal Symptoms Reviewed: Back pain, Limited mobility Additional Musculoskeletal Details: continued ongoing back pain Musculoskeletal Management Strategies: Medication therapy      Psychosocial Psychosocial Symptoms Reported: No symptoms reported Additional Psychological Details: patient has vascular dementia, referral made to the GUIDE program-assessment completed on 10/01/24 -patient and spouse refused services -referral left open, patient's daughter          10/12/2024    PHQ2-9 Depression Screening   Little interest or pleasure in doing things    Feeling down, depressed, or hopeless    PHQ-2 - Total Score    Trouble falling or staying asleep, or sleeping too much    Feeling tired or having little energy    Poor appetite or overeating     Feeling bad about yourself - or that you are a failure or have let yourself or your family down    Trouble concentrating on things, such as reading the newspaper or watching television    Moving or speaking so slowly that other people could have noticed.  Or the opposite - being so fidgety or restless that you have been moving around a lot more than usual    Thoughts that you would be better off dead, or hurting yourself in some way    PHQ2-9 Total Score    If you checked off any problems, how difficult have these problems made it for you to do your work, take care of things at home, or get along with other people    Depression Interventions/Treatment      There were no vitals  filed for this visit.    Medications Reviewed Today     Reviewed by Ermalinda Lenn HERO, LCSW (Social Worker) on 10/12/24 at 1119  Med List Status: <None>   Medication Order Taking? Sig Documenting Provider Last Dose Status Informant  amLODipine  (NORVASC ) 10 MG  tablet [id number] Yes Take 1 tablet (10 mg total) by mouth daily. Marylynn Verneita CROME, MD  Active   aspirin  81 MG tablet [id number] Yes Take 1 tablet (81 mg total) by mouth daily. Marylynn Verneita CROME, MD  Active   atorvastatin  (LIPITOR) 80 MG tablet [id number] Yes Take 1 tablet (80 mg total) by mouth daily. Marylynn Verneita CROME, MD  Active   donepezil  (ARICEPT ) 10 MG tablet [id number] Yes TAKE 1 TABLET(10 MG) BY MOUTH AT BEDTIME Tullo, Teresa L, MD  Active   ergocalciferol  (DRISDOL ) 1.25 MG (50000 UT) capsule [id number] Yes Take 1 capsule (50,000 Units total) by mouth once a week. Tullo, Teresa L, MD  Active   levothyroxine  (SYNTHROID ) 50 MCG tablet [id number] Yes TAKE 1 TABLET(50 MCG) BY MOUTH DAILY Tullo, Teresa L, MD  Active   mirtazapine  (REMERON ) 7.5 MG tablet [id number] Yes Take 1 tablet (7.5 mg total) by mouth at bedtime. Marylynn Verneita CROME, MD  Active   nitroGLYCERIN  (NITROSTAT ) 0.4 MG SL tablet [id number] Yes Place 1 tablet (0.4 mg total) under the tongue every 5 (five) minutes as needed for chest pain. Marylynn Verneita CROME, MD  Active   omeprazole  (PRILOSEC) 20 MG capsule [id number] Yes TAKE 1 CAPSULE(20 MG) BY MOUTH DAILY Tullo, Teresa L, MD  Active   telmisartan  (MICARDIS ) 80 MG tablet [id number] Yes TAKE 1 TABLET(80 MG) BY MOUTH DAILY Tullo, Teresa L, MD  Active   vitamin E 400 UNIT capsule [id number]  Take 400 Units by mouth daily. [provider]  Active             Recommendation:   PCP Follow-up Continue to consult with the GUIDE-Dementia support program  Follow Up Plan:   Closing From:  Complex Care Management  Alvaretta Eisenberger, LCSW [place]  [facility], [facility] Licensed Clinical Social Worker  Direct Dial: [phone]

## 2024-10-12 NOTE — Patient Instructions (Signed)
 Visit Information  Thank you for taking time to visit with me today. Please don't hesitate to contact me if I can be of assistance to you before our next scheduled appointment.  Your next care management appointment is no further scheduled appointments.    Closing From: Complex Care Management.  Please call the care guide team at [phone] if you need to cancel, schedule, or reschedule an appointment.   Please call the Suicide and Crisis Lifeline: 988 call the [place]  National Suicide Prevention Lifeline: 408-225-0077 or TTY: 573-226-6272 TTY (226)704-1283) to talk to a trained counselor call 1-800-273-TALK (toll free, 24 hour hotline) call 911 if you are experiencing a Mental Health or Behavioral Health Crisis or need someone to talk to.  Bindi Klomp, LCSW [place]  [facility], [facility] Licensed Clinical Social Worker  Direct Dial: [phone]

## 2024-10-12 NOTE — Patient Outreach (Signed)
 Phone call to the [facility] Spoke with  Ricka Naegeli [phone] to complete referral for Me[employer] on Whe[employer].  Patient placed on waiting list. Per Ricka Naegeli, wait list may  not open up until 11/2024.   Hulon Ferron, LCSW [place]  [facility], [facility] Licensed Clinical Social Worker  Direct Dial: [phone]

## 2024-10-30 ENCOUNTER — Ambulatory Visit

## 2024-10-30 DIAGNOSIS — E538 Deficiency of other specified B group vitamins: Secondary | ICD-10-CM | POA: Diagnosis not present

## 2024-10-30 MED ORDER — CYANOCOBALAMIN 1000 MCG/ML IJ SOLN
1000.0000 ug | Freq: Once | INTRAMUSCULAR | Status: AC
  Administered 2024-10-30: 1000 ug via INTRAMUSCULAR

## 2024-10-30 NOTE — Progress Notes (Signed)
Pt received B12 injection in right deltoid muscle. Pt tolerated it well with no complaints or concerns.  

## 2024-11-21 ENCOUNTER — Other Ambulatory Visit: Payer: Self-pay

## 2024-11-21 DIAGNOSIS — I10 Essential (primary) hypertension: Secondary | ICD-10-CM

## 2024-11-21 MED ORDER — AMLODIPINE BESYLATE 10 MG PO TABS: 10.0000 mg | ORAL_TABLET | Freq: Every day | ORAL | 1 refills | Status: AC

## 2024-11-30 ENCOUNTER — Ambulatory Visit

## 2024-11-30 DIAGNOSIS — E538 Deficiency of other specified B group vitamins: Secondary | ICD-10-CM | POA: Diagnosis not present

## 2024-11-30 MED ORDER — CYANOCOBALAMIN 1000 MCG/ML IJ SOLN
1000.0000 ug | Freq: Once | INTRAMUSCULAR | Status: AC
  Administered 2024-11-30: 1000 ug via INTRAMUSCULAR

## 2024-11-30 NOTE — Progress Notes (Signed)
 After obtaining consent, and per orders of Dr. Marylynn, MD injection of B-12 Injection given IM in L Deltoid by Doyce Croak CMA. Patient tolerated injection well.

## 2024-12-27 ENCOUNTER — Ambulatory Visit

## 2025-01-02 ENCOUNTER — Ambulatory Visit: Admitting: Internal Medicine

## 2025-02-04 ENCOUNTER — Ambulatory Visit
